# Patient Record
Sex: Female | Born: 1985 | Race: Black or African American | Hispanic: No | Marital: Single | Smoking: Never smoker
Health system: Southern US, Community
[De-identification: ages and names within clinical notes are randomized; demographics above are authoritative.]

## PROBLEM LIST (undated history)

## (undated) DIAGNOSIS — L309 Dermatitis, unspecified: Secondary | ICD-10-CM

## (undated) DIAGNOSIS — D589 Hereditary hemolytic anemia, unspecified: Secondary | ICD-10-CM

## (undated) DIAGNOSIS — A749 Chlamydial infection, unspecified: Secondary | ICD-10-CM

## (undated) HISTORY — DX: Dermatitis, unspecified: L30.9

## (undated) HISTORY — DX: Chlamydial infection, unspecified: A74.9

---
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
## 2006-05-01 ENCOUNTER — Emergency Department (HOSPITAL_COMMUNITY): Admission: EM | Admit: 2006-05-01 | Discharge: 2006-05-02 | Payer: Self-pay | Admitting: Emergency Medicine

## 2007-01-11 ENCOUNTER — Emergency Department (HOSPITAL_COMMUNITY): Admission: EM | Admit: 2007-01-11 | Discharge: 2007-01-12 | Payer: Self-pay | Admitting: Emergency Medicine

## 2007-09-01 ENCOUNTER — Other Ambulatory Visit: Admission: RE | Admit: 2007-09-01 | Discharge: 2007-09-01 | Payer: Self-pay | Admitting: Family Medicine

## 2009-08-03 ENCOUNTER — Inpatient Hospital Stay (HOSPITAL_COMMUNITY)
Admission: EM | Admit: 2009-08-03 | Discharge: 2009-08-08 | Payer: Self-pay | Source: Home / Self Care | Admitting: Emergency Medicine

## 2009-08-04 ENCOUNTER — Ambulatory Visit: Payer: Self-pay | Admitting: Oncology

## 2009-08-04 ENCOUNTER — Encounter (INDEPENDENT_AMBULATORY_CARE_PROVIDER_SITE_OTHER): Payer: Self-pay | Admitting: Internal Medicine

## 2009-08-07 ENCOUNTER — Ambulatory Visit: Payer: Self-pay | Admitting: Hematology & Oncology

## 2009-08-09 ENCOUNTER — Ambulatory Visit: Payer: Self-pay | Admitting: Oncology

## 2009-08-10 LAB — CBC WITH DIFFERENTIAL (CANCER CENTER ONLY)
HCT: 25 % — ABNORMAL LOW (ref 34.8–46.6)
HGB: 8.7 g/dL — ABNORMAL LOW (ref 11.6–15.9)
MCH: 38.5 pg — ABNORMAL HIGH (ref 26.0–34.0)
MCHC: 34.7 g/dL (ref 32.0–36.0)
MCV: 111 fL — ABNORMAL HIGH (ref 81–101)
RDW: 14.6 % (ref 10.5–14.6)
WBC: 13.2 10*3/uL — ABNORMAL HIGH (ref 3.9–10.0)

## 2009-08-10 LAB — CMP (CANCER CENTER ONLY)
ALT(SGPT): 18 U/L (ref 10–47)
Albumin: 4.4 g/dL (ref 3.3–5.5)
Alkaline Phosphatase: 53 U/L (ref 26–84)
BUN, Bld: 13 mg/dL (ref 7–22)
CO2: 26 mEq/L (ref 18–33)
Creat: 0.7 mg/dl (ref 0.6–1.2)
Glucose, Bld: 89 mg/dL (ref 73–118)
Potassium: 3.7 mEq/L (ref 3.3–4.7)
Total Bilirubin: 1.7 mg/dl — ABNORMAL HIGH (ref 0.20–1.60)

## 2009-08-10 LAB — MANUAL DIFFERENTIAL (CHCC SATELLITE)
ALC: 3.8 10*3/uL — ABNORMAL HIGH (ref 0.6–2.2)
LYMPH: 29 % (ref 14–48)
MONO: 9 % (ref 0–13)
PLT EST ~~LOC~~: INCREASED
SEG: 61 % (ref 40–75)

## 2009-08-11 LAB — IRON AND TIBC
%SAT: 36 % (ref 20–55)
Iron: 108 ug/dL (ref 42–145)

## 2009-08-11 LAB — HAPTOGLOBIN: Haptoglobin: <6 mg/dL — ABNORMAL LOW (ref 16–200)

## 2009-08-12 ENCOUNTER — Ambulatory Visit: Payer: Self-pay | Admitting: Oncology

## 2009-08-12 LAB — CBC WITH DIFFERENTIAL/PLATELET
BASO%: 1 % (ref 0.0–2.0)
HCT: 27.1 % — ABNORMAL LOW (ref 34.8–46.6)
MCHC: 33.5 g/dL (ref 31.5–36.0)
MONO#: 1.2 10*3/uL — ABNORMAL HIGH (ref 0.1–0.9)
NEUT%: 48 % (ref 38.4–76.8)
RBC: 2.43 10*6/uL — ABNORMAL LOW (ref 3.70–5.45)
RDW: 20.5 % — ABNORMAL HIGH (ref 11.2–14.5)
WBC: 16.9 10*3/uL — ABNORMAL HIGH (ref 3.9–10.3)
lymph#: 7.3 10*3/uL — ABNORMAL HIGH (ref 0.9–3.3)

## 2009-08-17 LAB — CBC WITH DIFFERENTIAL (CANCER CENTER ONLY)
HCT: 25.6 % — ABNORMAL LOW (ref 34.8–46.6)
HGB: 8.8 g/dL — ABNORMAL LOW (ref 11.6–15.9)
RBC: 2.34 10*6/uL — ABNORMAL LOW (ref 3.70–5.32)
WBC: 23 10*3/uL — ABNORMAL HIGH (ref 3.9–10.0)

## 2009-08-17 LAB — MANUAL DIFFERENTIAL (CHCC SATELLITE)
ALC: 4.4 10*3/uL — ABNORMAL HIGH (ref 0.6–2.2)
MONO: 6 % (ref 0–13)
SEG: 75 % (ref 40–75)

## 2009-08-18 LAB — HAPTOGLOBIN: Haptoglobin: <6 mg/dL — ABNORMAL LOW (ref 16–200)

## 2009-08-18 LAB — ANTI-NUCLEAR AB-TITER (ANA TITER): ANA Titer 1: 1:320 {titer} — ABNORMAL HIGH

## 2009-08-18 LAB — ANA: Anti Nuclear Antibody(ANA): POSITIVE — AB

## 2009-08-18 LAB — RETICULOCYTES (CHCC): Retic Ct Pct: 11.1 % — ABNORMAL HIGH (ref 0.4–3.1)

## 2009-08-19 LAB — CBC WITH DIFFERENTIAL/PLATELET
BASO%: 0.1 % (ref 0.0–2.0)
Eosinophils Absolute: 0 10*3/uL (ref 0.0–0.5)
MCHC: 31.8 g/dL (ref 31.5–36.0)
MONO#: 1 10*3/uL — ABNORMAL HIGH (ref 0.1–0.9)
NEUT#: 17.3 10*3/uL — ABNORMAL HIGH (ref 1.5–6.5)
Platelets: 346 10*3/uL (ref 145–400)
RBC: 2.52 10*6/uL — ABNORMAL LOW (ref 3.70–5.45)
RDW: 19.8 % — ABNORMAL HIGH (ref 11.2–14.5)
WBC: 21.7 10*3/uL — ABNORMAL HIGH (ref 3.9–10.3)
lymph#: 3.4 10*3/uL — ABNORMAL HIGH (ref 0.9–3.3)
nRBC: 1 % — ABNORMAL HIGH (ref 0–0)

## 2009-08-24 LAB — CBC WITH DIFFERENTIAL (CANCER CENTER ONLY)
BASO#: 0.1 10*3/uL (ref 0.0–0.2)
BASO%: 0.4 % (ref 0.0–2.0)
EOS%: 0.9 % (ref 0.0–7.0)
HCT: 26.7 % — ABNORMAL LOW (ref 34.8–46.6)
HGB: 9.3 g/dL — ABNORMAL LOW (ref 11.6–15.9)
LYMPH#: 1.9 10*3/uL (ref 0.9–3.3)
MCHC: 34.8 g/dL (ref 32.0–36.0)
MONO#: 0.5 10*3/uL (ref 0.1–0.9)
NEUT#: 11.3 10*3/uL — ABNORMAL HIGH (ref 1.5–6.5)
NEUT%: 81.8 % — ABNORMAL HIGH (ref 39.6–80.0)
WBC: 13.8 10*3/uL — ABNORMAL HIGH (ref 3.9–10.0)

## 2009-08-24 LAB — CMP (CANCER CENTER ONLY)
ALT(SGPT): 17 U/L (ref 10–47)
AST: 18 U/L (ref 11–38)
Albumin: 4.2 g/dL (ref 3.3–5.5)
BUN, Bld: 14 mg/dL (ref 7–22)
CO2: 25 mEq/L (ref 18–33)
Calcium: 9.4 mg/dL (ref 8.0–10.3)
Chloride: 101 mEq/L (ref 98–108)
Creat: 0.8 mg/dl (ref 0.6–1.2)
Potassium: 4.5 mEq/L (ref 3.3–4.7)

## 2009-08-24 LAB — LACTATE DEHYDROGENASE: LDH: 365 U/L — ABNORMAL HIGH (ref 94–250)

## 2009-08-31 LAB — CBC WITH DIFFERENTIAL (CANCER CENTER ONLY)
BASO%: 0.5 % (ref 0.0–2.0)
EOS%: 0.8 % (ref 0.0–7.0)
LYMPH%: 11.2 % — ABNORMAL LOW (ref 14.0–48.0)
MCH: 35.1 pg — ABNORMAL HIGH (ref 26.0–34.0)
MCV: 100 fL (ref 81–101)
MONO%: 5.6 % (ref 0.0–13.0)
NEUT#: 12 10*3/uL — ABNORMAL HIGH (ref 1.5–6.5)
Platelets: 438 10*3/uL — ABNORMAL HIGH (ref 145–400)
RDW: 14.4 % (ref 10.5–14.6)

## 2009-08-31 LAB — BASIC METABOLIC PANEL - CANCER CENTER ONLY
CO2: 26 mEq/L (ref 18–33)
Calcium: 9.7 mg/dL (ref 8.0–10.3)
Chloride: 100 mEq/L (ref 98–108)
Glucose, Bld: 98 mg/dL (ref 73–118)
Sodium: 131 mEq/L (ref 128–145)

## 2009-09-07 ENCOUNTER — Ambulatory Visit: Payer: Self-pay | Admitting: Oncology

## 2009-09-13 LAB — BASIC METABOLIC PANEL - CANCER CENTER ONLY
BUN, Bld: 15 mg/dL (ref 7–22)
Chloride: 103 mEq/L (ref 98–108)
Glucose, Bld: 92 mg/dL (ref 73–118)
Potassium: 3.9 mEq/L (ref 3.3–4.7)
Sodium: 139 mEq/L (ref 128–145)

## 2009-09-13 LAB — CBC WITH DIFFERENTIAL (CANCER CENTER ONLY)
BASO#: 0 10*3/uL (ref 0.0–0.2)
EOS%: 1 % (ref 0.0–7.0)
Eosinophils Absolute: 0.1 10*3/uL (ref 0.0–0.5)
HGB: 12 g/dL (ref 11.6–15.9)
LYMPH#: 1.9 10*3/uL (ref 0.9–3.3)
MONO%: 8.8 % (ref 0.0–13.0)
NEUT#: 4.3 10*3/uL (ref 1.5–6.5)
Platelets: 315 10*3/uL (ref 145–400)
RBC: 3.74 10*6/uL (ref 3.70–5.32)
WBC: 6.9 10*3/uL (ref 3.9–10.0)

## 2009-09-14 LAB — LACTATE DEHYDROGENASE: LDH: 195 U/L (ref 94–250)

## 2009-09-14 LAB — HAPTOGLOBIN: Haptoglobin: 23 mg/dL (ref 16–200)

## 2009-09-21 LAB — MANUAL DIFFERENTIAL (CHCC SATELLITE)
ALC: 2.3 10*3/uL — ABNORMAL HIGH (ref 0.6–2.2)
ANC (CHCC HP manual diff): 4 10*3/uL (ref 1.5–6.7)
LYMPH: 33 % (ref 14–48)
MONO: 10 % (ref 0–13)
RBC Comments: NORMAL

## 2009-09-21 LAB — CBC WITH DIFFERENTIAL (CANCER CENTER ONLY)
HCT: 37.9 % (ref 34.8–46.6)
HGB: 12.7 g/dL (ref 11.6–15.9)
MCHC: 33.6 g/dL (ref 32.0–36.0)
RBC: 4.11 10*6/uL (ref 3.70–5.32)

## 2009-09-27 LAB — RETICULOCYTES (CHCC)
RBC.: 4.03 MIL/uL (ref 3.87–5.11)
Retic Ct Pct: 0.8 % (ref 0.4–3.1)

## 2009-09-27 LAB — CBC WITH DIFFERENTIAL (CANCER CENTER ONLY)
BASO%: 0.5 % (ref 0.0–2.0)
EOS%: 1.5 % (ref 0.0–7.0)
HCT: 34.2 % — ABNORMAL LOW (ref 34.8–46.6)
LYMPH%: 33.8 % (ref 14.0–48.0)
MCH: 31 pg (ref 26.0–34.0)
MCHC: 34.6 g/dL (ref 32.0–36.0)
MCV: 90 fL (ref 81–101)
MONO%: 11.2 % (ref 0.0–13.0)
NEUT%: 53 % (ref 39.6–80.0)
Platelets: 275 10*3/uL (ref 145–400)
RDW: 14.5 % (ref 10.5–14.6)
WBC: 4.3 10*3/uL (ref 3.9–10.0)

## 2009-09-27 LAB — BASIC METABOLIC PANEL - CANCER CENTER ONLY
CO2: 25 mEq/L (ref 18–33)
Calcium: 9.1 mg/dL (ref 8.0–10.3)
Creat: 0.5 mg/dl — ABNORMAL LOW (ref 0.6–1.2)
Sodium: 138 mEq/L (ref 128–145)

## 2009-09-27 LAB — HEPATIC FUNCTION PANEL
ALT: 12 U/L (ref 0–35)
Bilirubin, Direct: 0.1 mg/dL (ref 0.0–0.3)
Indirect Bilirubin: 0.2 mg/dL (ref 0.0–0.9)
Total Bilirubin: 0.3 mg/dL (ref 0.3–1.2)

## 2009-09-27 LAB — LACTATE DEHYDROGENASE: LDH: 182 U/L (ref 94–250)

## 2009-09-27 LAB — HAPTOGLOBIN: Haptoglobin: 55 mg/dL (ref 16–200)

## 2009-10-04 ENCOUNTER — Ambulatory Visit: Payer: Self-pay | Admitting: Oncology

## 2009-10-04 LAB — CBC WITH DIFFERENTIAL (CANCER CENTER ONLY)
BASO%: 0.6 % (ref 0.0–2.0)
LYMPH%: 34.9 % (ref 14.0–48.0)
MCV: 89 fL (ref 81–101)
MONO#: 0.6 10*3/uL (ref 0.1–0.9)
MONO%: 9.2 % (ref 0.0–13.0)
NEUT#: 3.4 10*3/uL (ref 1.5–6.5)
Platelets: 291 10*3/uL (ref 145–400)
RDW: 14 % (ref 10.5–14.6)
WBC: 6 10*3/uL (ref 3.9–10.0)

## 2009-10-04 LAB — CMP (CANCER CENTER ONLY)
ALT(SGPT): 15 U/L (ref 10–47)
Alkaline Phosphatase: 49 U/L (ref 26–84)
Potassium: 3.4 mEq/L (ref 3.3–4.7)
Sodium: 132 mEq/L (ref 128–145)
Total Bilirubin: 0.5 mg/dl (ref 0.20–1.60)
Total Protein: 7.3 g/dL (ref 6.4–8.1)

## 2009-10-11 LAB — CBC WITH DIFFERENTIAL/PLATELET
BASO%: 0.4 % (ref 0.0–2.0)
LYMPH%: 24.3 % (ref 14.0–49.7)
MCHC: 34 g/dL (ref 31.5–36.0)
MONO#: 0.9 10*3/uL (ref 0.1–0.9)
RBC: 4.18 10*6/uL (ref 3.70–5.45)
WBC: 7.4 10*3/uL (ref 3.9–10.3)
lymph#: 1.8 10*3/uL (ref 0.9–3.3)

## 2009-10-18 LAB — CBC WITH DIFFERENTIAL/PLATELET
BASO%: 0.5 % (ref 0.0–2.0)
HCT: 37.7 % (ref 34.8–46.6)
HGB: 13 g/dL (ref 11.6–15.9)
MCHC: 34.5 g/dL (ref 31.5–36.0)
MONO#: 0.5 10*3/uL (ref 0.1–0.9)
NEUT%: 57.8 % (ref 38.4–76.8)
RDW: 15.9 % — ABNORMAL HIGH (ref 11.2–14.5)
WBC: 6.4 10*3/uL (ref 3.9–10.3)
lymph#: 2.2 10*3/uL (ref 0.9–3.3)

## 2009-10-19 ENCOUNTER — Ambulatory Visit: Payer: Self-pay | Admitting: Oncology

## 2009-10-25 LAB — CMP (CANCER CENTER ONLY)
Albumin: 3.7 g/dL (ref 3.3–5.5)
BUN, Bld: 10 mg/dL (ref 7–22)
CO2: 25 mEq/L (ref 18–33)
Calcium: 8.8 mg/dL (ref 8.0–10.3)
Chloride: 99 mEq/L (ref 98–108)
Creat: 0.6 mg/dl (ref 0.6–1.2)
Glucose, Bld: 96 mg/dL (ref 73–118)
Potassium: 3.8 mEq/L (ref 3.3–4.7)

## 2009-10-25 LAB — CBC WITH DIFFERENTIAL (CANCER CENTER ONLY)
BASO#: 0 10*3/uL (ref 0.0–0.2)
EOS%: 1.7 % (ref 0.0–7.0)
Eosinophils Absolute: 0.1 10*3/uL (ref 0.0–0.5)
HCT: 35.9 % (ref 34.8–46.6)
HGB: 12.4 g/dL (ref 11.6–15.9)
LYMPH#: 1.4 10*3/uL (ref 0.9–3.3)
MCHC: 34.7 g/dL (ref 32.0–36.0)
MONO#: 0.6 10*3/uL (ref 0.1–0.9)
NEUT%: 60.6 % (ref 39.6–80.0)
RBC: 4.14 10*6/uL (ref 3.70–5.32)

## 2009-10-25 LAB — LACTATE DEHYDROGENASE: LDH: 155 U/L (ref 94–250)

## 2009-11-04 ENCOUNTER — Ambulatory Visit: Payer: Self-pay | Admitting: Oncology

## 2009-11-08 LAB — CBC WITH DIFFERENTIAL/PLATELET
BASO%: 0.3 % (ref 0.0–2.0)
EOS%: 2.1 % (ref 0.0–7.0)
LYMPH%: 42.9 % (ref 14.0–49.7)
MCHC: 34.2 g/dL (ref 31.5–36.0)
MCV: 86.4 fL (ref 79.5–101.0)
MONO%: 11.9 % (ref 0.0–14.0)
Platelets: 266 10*3/uL (ref 145–400)
RBC: 4.37 10*6/uL (ref 3.70–5.45)
WBC: 4.2 10*3/uL (ref 3.9–10.3)

## 2009-11-16 ENCOUNTER — Ambulatory Visit: Payer: Self-pay | Admitting: Oncology

## 2009-12-13 ENCOUNTER — Ambulatory Visit: Payer: Self-pay | Admitting: Oncology

## 2009-12-13 ENCOUNTER — Inpatient Hospital Stay (HOSPITAL_COMMUNITY): Admission: EM | Admit: 2009-12-13 | Discharge: 2009-12-16 | Payer: Self-pay | Admitting: Oncology

## 2009-12-13 LAB — CBC WITH DIFFERENTIAL (CANCER CENTER ONLY)
HCT: 21.9 % — ABNORMAL LOW (ref 34.8–46.6)
MCHC: 0 g/dL — ABNORMAL LOW (ref 32.0–36.0)
Platelets: 315 10*3/uL (ref 145–400)
RDW: 18 % — ABNORMAL HIGH (ref 10.5–14.6)
WBC: 10.9 10*3/uL — ABNORMAL HIGH (ref 3.9–10.0)

## 2009-12-13 LAB — MANUAL DIFFERENTIAL (CHCC SATELLITE)
Eos: 2 % (ref 0–7)
PLT EST ~~LOC~~: ADEQUATE
nRBC: 14 % — ABNORMAL HIGH (ref 0–0)

## 2009-12-14 LAB — RETICULOCYTES (CHCC)
ABS Retic: 294 10*3/uL — ABNORMAL HIGH (ref 19.0–186.0)
RBC.: 2.43 MIL/uL — ABNORMAL LOW (ref 3.87–5.11)
Retic Ct Pct: 12.1 % — ABNORMAL HIGH (ref 0.4–3.1)

## 2009-12-14 LAB — LACTATE DEHYDROGENASE: LDH: 437 U/L — ABNORMAL HIGH (ref 94–250)

## 2009-12-14 LAB — COMPREHENSIVE METABOLIC PANEL
AST: 27 U/L (ref 0–37)
Albumin: 4.3 g/dL (ref 3.5–5.2)
BUN: 4 mg/dL — ABNORMAL LOW (ref 6–23)
CO2: 25 mEq/L (ref 19–32)
Calcium: 9.8 mg/dL (ref 8.4–10.5)
Chloride: 103 mEq/L (ref 96–112)
Creatinine, Ser: 0.57 mg/dL (ref 0.40–1.20)
Glucose, Bld: 87 mg/dL (ref 70–99)
Potassium: 4 mEq/L (ref 3.5–5.3)

## 2009-12-14 LAB — VITAMIN B12: Vitamin B-12: 410 pg/mL (ref 211–911)

## 2009-12-14 LAB — HAPTOGLOBIN: Haptoglobin: <6 mg/dL — ABNORMAL LOW (ref 16–200)

## 2009-12-14 LAB — FOLATE: Folate: 15.5 ng/mL

## 2009-12-14 LAB — DIRECT ANTIGLOBULIN TEST (NOT AT ARMC): DAT IgG: POSITIVE — AB

## 2009-12-14 LAB — IRON AND TIBC: TIBC: 332 ug/dL (ref 250–470)

## 2009-12-16 ENCOUNTER — Ambulatory Visit: Payer: Self-pay | Admitting: Oncology

## 2009-12-20 LAB — CBC WITH DIFFERENTIAL (CANCER CENTER ONLY)
HGB: 7.4 g/dL — ABNORMAL LOW (ref 11.6–15.9)
MCH: 40.9 pg — ABNORMAL HIGH (ref 26.0–34.0)
MCHC: 34.6 g/dL (ref 32.0–36.0)
MCV: 118 fL — ABNORMAL HIGH (ref 81–101)
Platelets: 320 10*3/uL (ref 145–400)
RBC: 1.82 10*6/uL — ABNORMAL LOW (ref 3.70–5.32)

## 2009-12-20 LAB — MANUAL DIFFERENTIAL (CHCC SATELLITE)
ANC (CHCC HP manual diff): 13.7 10*3/uL — ABNORMAL HIGH (ref 1.5–6.7)
Band Neutrophils: 3 % (ref 0–10)
LYMPH: 13 % — ABNORMAL LOW (ref 14–48)
Metamyelocytes: 3 % — ABNORMAL HIGH (ref 0–0)
Myelocytes: 1 % — ABNORMAL HIGH (ref 0–0)
PLT EST ~~LOC~~: ADEQUATE
nRBC: 51 % — ABNORMAL HIGH (ref 0–0)

## 2009-12-22 ENCOUNTER — Ambulatory Visit: Payer: Self-pay | Admitting: Oncology

## 2009-12-26 ENCOUNTER — Inpatient Hospital Stay (HOSPITAL_COMMUNITY)
Admission: AD | Admit: 2009-12-26 | Discharge: 2010-01-09 | Payer: Self-pay | Source: Home / Self Care | Admitting: Oncology

## 2009-12-26 LAB — CBC & DIFF AND RETIC
Basophils Absolute: 0.3 10*3/uL — ABNORMAL HIGH (ref 0.0–0.1)
EOS%: 0.1 % (ref 0.0–7.0)
Eosinophils Absolute: 0 10*3/uL (ref 0.0–0.5)
HGB: 5.4 g/dL — CL (ref 11.6–15.9)
LYMPH%: 10.7 % — ABNORMAL LOW (ref 14.0–49.7)
MCH: 43.2 pg — ABNORMAL HIGH (ref 25.1–34.0)
MCV: 140 fL — ABNORMAL HIGH (ref 79.5–101.0)
MONO%: 11.1 % (ref 0.0–14.0)
NEUT#: 22.6 10*3/uL — ABNORMAL HIGH (ref 1.5–6.5)
NEUT%: 77.2 % — ABNORMAL HIGH (ref 38.4–76.8)
Platelets: 233 10*3/uL (ref 145–400)

## 2009-12-26 LAB — COMPREHENSIVE METABOLIC PANEL
Albumin: 3.7 g/dL (ref 3.5–5.2)
Alkaline Phosphatase: 33 U/L — ABNORMAL LOW (ref 39–117)
BUN: 11 mg/dL (ref 6–23)
CO2: 29 mEq/L (ref 19–32)
Calcium: 9.8 mg/dL (ref 8.4–10.5)
Chloride: 101 mEq/L (ref 96–112)
Glucose, Bld: 78 mg/dL (ref 70–99)
Potassium: 3.7 mEq/L (ref 3.5–5.3)
Sodium: 138 mEq/L (ref 135–145)
Total Protein: 6.8 g/dL (ref 6.0–8.3)

## 2009-12-26 LAB — RETICULOCYTES (CHCC)

## 2009-12-27 LAB — DIRECT ANTIGLOBULIN TEST (NOT AT ARMC): DAT IgG: POSITIVE — AB

## 2010-01-05 ENCOUNTER — Encounter: Payer: Self-pay | Admitting: Oncology

## 2010-01-13 LAB — CBC WITH DIFFERENTIAL/PLATELET
Basophils Absolute: 0 10*3/uL (ref 0.0–0.1)
Eosinophils Absolute: 0 10*3/uL (ref 0.0–0.5)
HCT: 31.8 % — ABNORMAL LOW (ref 34.8–46.6)
HGB: 9.7 g/dL — ABNORMAL LOW (ref 11.6–15.9)
LYMPH%: 4.6 % — ABNORMAL LOW (ref 14.0–49.7)
MCV: 108.2 fL — ABNORMAL HIGH (ref 79.5–101.0)
MONO#: 0.3 10*3/uL (ref 0.1–0.9)
MONO%: 1.9 % (ref 0.0–14.0)
NEUT#: 14.6 10*3/uL — ABNORMAL HIGH (ref 1.5–6.5)
NEUT%: 93.4 % — ABNORMAL HIGH (ref 38.4–76.8)
Platelets: 513 10*3/uL — ABNORMAL HIGH (ref 145–400)
RBC: 2.94 10*6/uL — ABNORMAL LOW (ref 3.70–5.45)

## 2010-01-14 LAB — DIRECT ANTIGLOBULIN TEST (NOT AT ARMC)
DAT (Complement): NEGATIVE
DAT IgG: POSITIVE — AB

## 2010-01-17 ENCOUNTER — Ambulatory Visit: Payer: Self-pay | Admitting: Oncology

## 2010-01-19 LAB — CBC & DIFF AND RETIC
Basophils Absolute: 0 10*3/uL (ref 0.0–0.1)
EOS%: 0.2 % (ref 0.0–7.0)
Eosinophils Absolute: 0 10*3/uL (ref 0.0–0.5)
HCT: 33.8 % — ABNORMAL LOW (ref 34.8–46.6)
HGB: 10.3 g/dL — ABNORMAL LOW (ref 11.6–15.9)
Immature Retic Fract: 17.3 % — ABNORMAL HIGH (ref 0.00–10.70)
MCH: 32 pg (ref 25.1–34.0)
MCV: 105 fL — ABNORMAL HIGH (ref 79.5–101.0)
NEUT#: 10.4 10*3/uL — ABNORMAL HIGH (ref 1.5–6.5)
NEUT%: 70.2 % (ref 38.4–76.8)
RDW: 17.1 % — ABNORMAL HIGH (ref 11.2–14.5)
Retic Ct Abs: 152.31 10*3/uL — ABNORMAL HIGH (ref 18.30–72.70)
lymph#: 2.6 10*3/uL (ref 0.9–3.3)

## 2010-01-19 LAB — LACTATE DEHYDROGENASE: LDH: 334 U/L — ABNORMAL HIGH (ref 94–250)

## 2010-02-02 LAB — CBC WITH DIFFERENTIAL/PLATELET
BASO%: 0.3 % (ref 0.0–2.0)
Basophils Absolute: 0 10*3/uL (ref 0.0–0.1)
EOS%: 0.1 % (ref 0.0–7.0)
Eosinophils Absolute: 0 10*3/uL (ref 0.0–0.5)
HCT: 37.2 % (ref 34.8–46.6)
HGB: 12.4 g/dL (ref 11.6–15.9)
LYMPH%: 18.7 % (ref 14.0–49.7)
MCH: 32 pg (ref 25.1–34.0)
MCHC: 33.3 g/dL (ref 31.5–36.0)
MCV: 96.2 fL (ref 79.5–101.0)
MONO#: 1 10*3/uL — ABNORMAL HIGH (ref 0.1–0.9)
MONO%: 7.1 % (ref 0.0–14.0)
NEUT#: 10.8 10*3/uL — ABNORMAL HIGH (ref 1.5–6.5)
NEUT%: 73.8 % (ref 38.4–76.8)
Platelets: 405 10*3/uL — ABNORMAL HIGH (ref 145–400)
RBC: 3.87 10*6/uL (ref 3.70–5.45)
RDW: 18.3 % — ABNORMAL HIGH (ref 11.2–14.5)
WBC: 14.6 10*3/uL — ABNORMAL HIGH (ref 3.9–10.3)
lymph#: 2.7 10*3/uL (ref 0.9–3.3)

## 2010-02-05 DIAGNOSIS — D589 Hereditary hemolytic anemia, unspecified: Secondary | ICD-10-CM

## 2010-02-05 HISTORY — PX: SPLENECTOMY: SUR1306

## 2010-02-05 HISTORY — DX: Hereditary hemolytic anemia, unspecified: D58.9

## 2010-02-09 LAB — CBC WITH DIFFERENTIAL/PLATELET
BASO%: 0.3 % (ref 0.0–2.0)
Basophils Absolute: 0 10*3/uL (ref 0.0–0.1)
EOS%: 0.2 % (ref 0.0–7.0)
Eosinophils Absolute: 0 10*3/uL (ref 0.0–0.5)
HCT: 41 % (ref 34.8–46.6)
HGB: 13.2 g/dL (ref 11.6–15.9)
LYMPH%: 23.5 % (ref 14.0–49.7)
MCH: 30.5 pg (ref 25.1–34.0)
MCHC: 32.2 g/dL (ref 31.5–36.0)
MCV: 94.7 fL (ref 79.5–101.0)
MONO#: 1.5 10*3/uL — ABNORMAL HIGH (ref 0.1–0.9)
MONO%: 13.5 % (ref 0.0–14.0)
NEUT#: 6.8 10*3/uL — ABNORMAL HIGH (ref 1.5–6.5)
NEUT%: 62.5 % (ref 38.4–76.8)
Platelets: 379 10*3/uL (ref 145–400)
RBC: 4.33 10*6/uL (ref 3.70–5.45)
RDW: 15.7 % — ABNORMAL HIGH (ref 11.2–14.5)
WBC: 10.9 10*3/uL — ABNORMAL HIGH (ref 3.9–10.3)
lymph#: 2.6 10*3/uL (ref 0.9–3.3)
nRBC: 0 % (ref 0–0)

## 2010-02-16 ENCOUNTER — Ambulatory Visit: Payer: Self-pay | Admitting: Oncology

## 2010-02-16 LAB — CBC WITH DIFFERENTIAL/PLATELET
BASO%: 1.1 % (ref 0.0–2.0)
Basophils Absolute: 0.1 10*3/uL (ref 0.0–0.1)
EOS%: 1 % (ref 0.0–7.0)
Eosinophils Absolute: 0.1 10*3/uL (ref 0.0–0.5)
HCT: 40.3 % (ref 34.8–46.6)
HGB: 13.1 g/dL (ref 11.6–15.9)
LYMPH%: 28.2 % (ref 14.0–49.7)
MCH: 30.3 pg (ref 25.1–34.0)
MCHC: 32.5 g/dL (ref 31.5–36.0)
MCV: 93.3 fL (ref 79.5–101.0)
MONO#: 1.1 10*3/uL — ABNORMAL HIGH (ref 0.1–0.9)
MONO%: 15.1 % — ABNORMAL HIGH (ref 0.0–14.0)
NEUT#: 3.8 10*3/uL (ref 1.5–6.5)
NEUT%: 54.6 % (ref 38.4–76.8)
Platelets: 323 10*3/uL (ref 145–400)
RBC: 4.32 10*6/uL (ref 3.70–5.45)
RDW: 15.2 % — ABNORMAL HIGH (ref 11.2–14.5)
WBC: 7 10*3/uL (ref 3.9–10.3)
lymph#: 2 10*3/uL (ref 0.9–3.3)
nRBC: 0 % (ref 0–0)

## 2010-02-20 LAB — CBC & DIFF AND RETIC
BASO%: 0.6 % (ref 0.0–2.0)
Basophils Absolute: 0 10*3/uL (ref 0.0–0.1)
EOS%: 1 % (ref 0.0–7.0)
Eosinophils Absolute: 0.1 10*3/uL (ref 0.0–0.5)
HCT: 40.6 % (ref 34.8–46.6)
HGB: 13.3 g/dL (ref 11.6–15.9)
Immature Retic Fract: 4.1 % (ref 0.00–10.70)
LYMPH%: 23.8 % (ref 14.0–49.7)
MCH: 30.1 pg (ref 25.1–34.0)
MCHC: 32.8 g/dL (ref 31.5–36.0)
MCV: 91.9 fL (ref 79.5–101.0)
MONO#: 0.7 10*3/uL (ref 0.1–0.9)
MONO%: 12.4 % (ref 0.0–14.0)
NEUT#: 3.3 10*3/uL (ref 1.5–6.5)
NEUT%: 62.2 % (ref 38.4–76.8)
Platelets: ADEQUATE 10*3/uL (ref 145–400)
RBC: 4.42 10*6/uL (ref 3.70–5.45)
RDW: 15 % — ABNORMAL HIGH (ref 11.2–14.5)
Retic %: 0.68 % (ref 0.50–1.50)
Retic Ct Abs: 30.06 10*3/uL (ref 18.30–72.70)
WBC: 5.3 10*3/uL (ref 3.9–10.3)
lymph#: 1.3 10*3/uL (ref 0.9–3.3)

## 2010-02-21 LAB — DIRECT ANTIGLOBULIN TEST (NOT AT ARMC)
DAT (Complement): NEGATIVE
DAT IgG: POSITIVE — AB

## 2010-02-21 LAB — COMPREHENSIVE METABOLIC PANEL
ALT: 25 U/L (ref 0–35)
AST: 22 U/L (ref 0–37)
Albumin: 4.4 g/dL (ref 3.5–5.2)
Alkaline Phosphatase: 50 U/L (ref 39–117)
BUN: 9 mg/dL (ref 6–23)
CO2: 23 mEq/L (ref 19–32)
Calcium: 9.7 mg/dL (ref 8.4–10.5)
Chloride: 106 mEq/L (ref 96–112)
Creatinine, Ser: 0.63 mg/dL (ref 0.40–1.20)
Glucose, Bld: 98 mg/dL (ref 70–99)
Potassium: 4.1 mEq/L (ref 3.5–5.3)
Sodium: 141 mEq/L (ref 135–145)
Total Bilirubin: 0.2 mg/dL — ABNORMAL LOW (ref 0.3–1.2)
Total Protein: 7.1 g/dL (ref 6.0–8.3)

## 2010-02-21 LAB — HAPTOGLOBIN: Haptoglobin: 50 mg/dL (ref 16–200)

## 2010-02-21 LAB — LACTATE DEHYDROGENASE: LDH: 204 U/L (ref 94–250)

## 2010-04-06 ENCOUNTER — Encounter (HOSPITAL_BASED_OUTPATIENT_CLINIC_OR_DEPARTMENT_OTHER): Payer: BC Managed Care – PPO | Admitting: Oncology

## 2010-04-06 ENCOUNTER — Other Ambulatory Visit: Payer: Self-pay | Admitting: Oncology

## 2010-04-06 LAB — CBC WITH DIFFERENTIAL/PLATELET
Basophils Absolute: 0 10*3/uL (ref 0.0–0.1)
EOS%: 1.3 % (ref 0.0–7.0)
HCT: 34.3 % — ABNORMAL LOW (ref 34.8–46.6)
HGB: 12.2 g/dL (ref 11.6–15.9)
LYMPH%: 21.6 % (ref 14.0–49.7)
MCH: 29.9 pg (ref 25.1–34.0)
MONO#: 0.8 10*3/uL (ref 0.1–0.9)
NEUT%: 67.6 % (ref 38.4–76.8)
Platelets: 353 10*3/uL (ref 145–400)
lymph#: 1.9 10*3/uL (ref 0.9–3.3)

## 2010-04-18 LAB — RETICULOCYTES
RBC.: 1.81 MIL/uL — ABNORMAL LOW (ref 3.87–5.11)
RBC.: 1.93 MIL/uL — ABNORMAL LOW (ref 3.87–5.11)
RBC.: 2.23 MIL/uL — ABNORMAL LOW (ref 3.87–5.11)
Retic Ct Pct: >18 % — ABNORMAL HIGH (ref 0.4–3.1)
Retic Ct Pct: >20 % — ABNORMAL HIGH (ref 0.4–3.1)
Retic Ct Pct: >20 % — ABNORMAL HIGH (ref 0.4–3.1)

## 2010-04-18 LAB — CBC
HCT: 19.6 % — ABNORMAL LOW (ref 36.0–46.0)
HCT: 20.3 % — ABNORMAL LOW (ref 36.0–46.0)
HCT: 21.3 % — ABNORMAL LOW (ref 36.0–46.0)
HCT: 22.1 % — ABNORMAL LOW (ref 36.0–46.0)
HCT: 28.8 % — ABNORMAL LOW (ref 36.0–46.0)
Hemoglobin: 6.5 g/dL — CL (ref 12.0–15.0)
Hemoglobin: 6.8 g/dL — CL (ref 12.0–15.0)
Hemoglobin: 6.8 g/dL — CL (ref 12.0–15.0)
Hemoglobin: 6.8 g/dL — CL (ref 12.0–15.0)
Hemoglobin: 7 g/dL — ABNORMAL LOW (ref 12.0–15.0)
Hemoglobin: 7.2 g/dL — ABNORMAL LOW (ref 12.0–15.0)
Hemoglobin: 7.8 g/dL — ABNORMAL LOW (ref 12.0–15.0)
Hemoglobin: 8.2 g/dL — ABNORMAL LOW (ref 12.0–15.0)
Hemoglobin: 8.8 g/dL — ABNORMAL LOW (ref 12.0–15.0)
MCH: 36 pg — ABNORMAL HIGH (ref 26.0–34.0)
MCH: 36.3 pg — ABNORMAL HIGH (ref 26.0–34.0)
MCH: 37.8 pg — ABNORMAL HIGH (ref 26.0–34.0)
MCH: 37.8 pg — ABNORMAL HIGH (ref 26.0–34.0)
MCH: 39.4 pg — ABNORMAL HIGH (ref 26.0–34.0)
MCH: 39.5 pg — ABNORMAL HIGH (ref 26.0–34.0)
MCH: 40.3 pg — ABNORMAL HIGH (ref 26.0–34.0)
MCH: 40.5 pg — ABNORMAL HIGH (ref 26.0–34.0)
MCH: 40.9 pg — ABNORMAL HIGH (ref 26.0–34.0)
MCHC: 30.6 g/dL (ref 30.0–36.0)
MCHC: 31.2 g/dL (ref 30.0–36.0)
MCHC: 33.6 g/dL (ref 30.0–36.0)
MCHC: 34.5 g/dL (ref 30.0–36.0)
MCHC: 36 g/dL (ref 30.0–36.0)
MCHC: 36 g/dL (ref 30.0–36.0)
MCV: 100.2 fL — ABNORMAL HIGH (ref 78.0–100.0)
MCV: 116.6 fL — ABNORMAL HIGH (ref 78.0–100.0)
MCV: 118.5 fL — ABNORMAL HIGH (ref 78.0–100.0)
MCV: 119.9 fL — ABNORMAL HIGH (ref 78.0–100.0)
MCV: 128.5 fL — ABNORMAL HIGH (ref 78.0–100.0)
Platelets: 212 10*3/uL (ref 150–400)
Platelets: 231 10*3/uL (ref 150–400)
Platelets: 237 10*3/uL (ref 150–400)
Platelets: 250 10*3/uL (ref 150–400)
Platelets: 267 10*3/uL (ref 150–400)
Platelets: 268 10*3/uL (ref 150–400)
Platelets: 285 10*3/uL (ref 150–400)
Platelets: 315 10*3/uL (ref 150–400)
Platelets: 329 10*3/uL (ref 150–400)
RBC: 1.65 MIL/uL — ABNORMAL LOW (ref 3.87–5.11)
RBC: 1.67 MIL/uL — ABNORMAL LOW (ref 3.87–5.11)
RBC: 1.71 MIL/uL — ABNORMAL LOW (ref 3.87–5.11)
RBC: 1.72 MIL/uL — ABNORMAL LOW (ref 3.87–5.11)
RBC: 1.74 MIL/uL — ABNORMAL LOW (ref 3.87–5.11)
RBC: 1.92 MIL/uL — ABNORMAL LOW (ref 3.87–5.11)
RBC: 2.26 MIL/uL — ABNORMAL LOW (ref 3.87–5.11)
RBC: 2.29 MIL/uL — ABNORMAL LOW (ref 3.87–5.11)
RBC: 2.38 MIL/uL — ABNORMAL LOW (ref 3.87–5.11)
RDW: 22.5 % — ABNORMAL HIGH (ref 11.5–15.5)
RDW: 23.3 % — ABNORMAL HIGH (ref 11.5–15.5)
RDW: 24.3 % — ABNORMAL HIGH (ref 11.5–15.5)
RDW: 25 % — ABNORMAL HIGH (ref 11.5–15.5)
RDW: 26.5 % — ABNORMAL HIGH (ref 11.5–15.5)
RDW: 33.6 % — ABNORMAL HIGH (ref 11.5–15.5)
WBC: 15.5 10*3/uL — ABNORMAL HIGH (ref 4.0–10.5)
WBC: 19 10*3/uL — ABNORMAL HIGH (ref 4.0–10.5)
WBC: 21 10*3/uL — ABNORMAL HIGH (ref 4.0–10.5)
WBC: 21.6 10*3/uL — ABNORMAL HIGH (ref 4.0–10.5)
WBC: 24 10*3/uL — ABNORMAL HIGH (ref 4.0–10.5)
WBC: 27.9 10*3/uL — ABNORMAL HIGH (ref 4.0–10.5)

## 2010-04-18 LAB — URINALYSIS, ROUTINE W REFLEX MICROSCOPIC
Bilirubin Urine: NEGATIVE
Hgb urine dipstick: NEGATIVE
Ketones, ur: NEGATIVE mg/dL
Nitrite: NEGATIVE
pH: 7.5 (ref 5.0–8.0)

## 2010-04-18 LAB — COMPREHENSIVE METABOLIC PANEL
ALT: 15 U/L (ref 0–35)
ALT: 15 U/L (ref 0–35)
ALT: 16 U/L (ref 0–35)
AST: 30 U/L (ref 0–37)
AST: 57 U/L — ABNORMAL HIGH (ref 0–37)
AST: 59 U/L — ABNORMAL HIGH (ref 0–37)
AST: 63 U/L — ABNORMAL HIGH (ref 0–37)
AST: 76 U/L — ABNORMAL HIGH (ref 0–37)
Albumin: 3 g/dL — ABNORMAL LOW (ref 3.5–5.2)
Albumin: 3.3 g/dL — ABNORMAL LOW (ref 3.5–5.2)
Albumin: 3.6 g/dL (ref 3.5–5.2)
Albumin: 3.8 g/dL (ref 3.5–5.2)
BUN: 10 mg/dL (ref 6–23)
BUN: 12 mg/dL (ref 6–23)
BUN: 17 mg/dL (ref 6–23)
CO2: 23 mEq/L (ref 19–32)
CO2: 27 mEq/L (ref 19–32)
CO2: 27 mEq/L (ref 19–32)
CO2: 27 mEq/L (ref 19–32)
Calcium: 9.6 mg/dL (ref 8.4–10.5)
Calcium: 9.6 mg/dL (ref 8.4–10.5)
Chloride: 102 mEq/L (ref 96–112)
Chloride: 102 mEq/L (ref 96–112)
Chloride: 103 mEq/L (ref 96–112)
Chloride: 105 mEq/L (ref 96–112)
Chloride: 107 mEq/L (ref 96–112)
Creatinine, Ser: 0.6 mg/dL (ref 0.4–1.2)
Creatinine, Ser: 0.69 mg/dL (ref 0.4–1.2)
Creatinine, Ser: 0.71 mg/dL (ref 0.4–1.2)
Creatinine, Ser: 0.77 mg/dL (ref 0.4–1.2)
Creatinine, Ser: 0.89 mg/dL (ref 0.4–1.2)
GFR calc Af Amer: >60 mL/min (ref >60)
GFR calc Af Amer: >60 mL/min (ref >60)
GFR calc Af Amer: >60 mL/min (ref >60)
GFR calc Af Amer: >60 mL/min (ref >60)
GFR calc Af Amer: >60 mL/min (ref >60)
GFR calc non Af Amer: >60 mL/min (ref >60)
GFR calc non Af Amer: >60 mL/min (ref >60)
GFR calc non Af Amer: >60 mL/min (ref >60)
Glucose, Bld: 87 mg/dL (ref 70–99)
Glucose, Bld: 91 mg/dL (ref 70–99)
Potassium: 4.4 mEq/L (ref 3.5–5.1)
Sodium: 136 mEq/L (ref 135–145)
Sodium: 136 mEq/L (ref 135–145)
Sodium: 136 mEq/L (ref 135–145)
Total Bilirubin: 1 mg/dL (ref 0.3–1.2)
Total Bilirubin: 1.2 mg/dL (ref 0.3–1.2)
Total Bilirubin: 1.5 mg/dL — ABNORMAL HIGH (ref 0.3–1.2)
Total Bilirubin: 1.7 mg/dL — ABNORMAL HIGH (ref 0.3–1.2)
Total Bilirubin: 1.9 mg/dL — ABNORMAL HIGH (ref 0.3–1.2)
Total Protein: 7.1 g/dL (ref 6.0–8.3)
Total Protein: 7.4 g/dL (ref 6.0–8.3)
Total Protein: 8.5 g/dL — ABNORMAL HIGH (ref 6.0–8.3)

## 2010-04-18 LAB — TYPE AND SCREEN
Antibody Screen: POSITIVE
DAT, IgG: POSITIVE
Unit division: 0
Unit division: 0
Unit division: 0

## 2010-04-18 LAB — DIFFERENTIAL
Basophils Absolute: 0 10*3/uL (ref 0.0–0.1)
Basophils Absolute: 0 10*3/uL (ref 0.0–0.1)
Basophils Relative: 0 % (ref 0–1)
Eosinophils Absolute: 0 10*3/uL (ref 0.0–0.7)
Eosinophils Absolute: 0 10*3/uL (ref 0.0–0.7)
Eosinophils Relative: 0 % (ref 0–5)
Lymphocytes Relative: 21 % (ref 12–46)
Lymphs Abs: 2.7 10*3/uL (ref 0.7–4.0)
Monocytes Absolute: 1.9 10*3/uL — ABNORMAL HIGH (ref 0.1–1.0)
Monocytes Relative: 7 % (ref 3–12)
Neutro Abs: 11.5 10*3/uL — ABNORMAL HIGH (ref 1.7–7.7)
Neutro Abs: 14.1 10*3/uL — ABNORMAL HIGH (ref 1.7–7.7)
Neutro Abs: 9.7 10*3/uL — ABNORMAL HIGH (ref 1.7–7.7)

## 2010-04-18 LAB — IRON AND TIBC
Iron: 117 ug/dL (ref 42–135)
UIBC: 140 ug/dL

## 2010-04-18 LAB — BASIC METABOLIC PANEL
BUN: 9 mg/dL (ref 6–23)
CO2: 27 mEq/L (ref 19–32)
Chloride: 104 mEq/L (ref 96–112)
Chloride: 99 mEq/L (ref 96–112)
GFR calc Af Amer: >60 mL/min (ref >60)
GFR calc non Af Amer: >60 mL/min (ref >60)
Glucose, Bld: 116 mg/dL — ABNORMAL HIGH (ref 70–99)
Potassium: 3.5 mEq/L (ref 3.5–5.1)
Potassium: 3.9 mEq/L (ref 3.5–5.1)
Sodium: 139 mEq/L (ref 135–145)

## 2010-04-18 LAB — HEMOGLOBIN AND HEMATOCRIT, BLOOD: Hemoglobin: 7.4 g/dL — ABNORMAL LOW (ref 12.0–15.0)

## 2010-04-18 LAB — CROSSMATCH
ABO/RH(D): AB POS
DAT, IgG: POSITIVE
Unit division: 0

## 2010-04-18 LAB — CULTURE, BLOOD (SINGLE): Culture: NO GROWTH

## 2010-04-18 LAB — URINE CULTURE
Culture  Setup Time: 201111090617
Culture: NO GROWTH

## 2010-04-18 LAB — HEMOGLOBIN: Hemoglobin: 7.6 g/dL — ABNORMAL LOW (ref 12.0–15.0)

## 2010-04-18 LAB — PROTIME-INR
INR: 0.93 (ref 0.00–1.49)
Prothrombin Time: 12.7 seconds (ref 11.6–15.2)

## 2010-04-18 LAB — MRSA PCR SCREENING
MRSA by PCR: NEGATIVE
MRSA by PCR: NEGATIVE

## 2010-04-23 LAB — CBC
HCT: 22 % — ABNORMAL LOW (ref 36.0–46.0)
HCT: 14.9 % — ABNORMAL LOW (ref 36.0–46.0)
HCT: 15.9 % — ABNORMAL LOW (ref 36.0–46.0)
Hemoglobin: 6.9 g/dL — CL (ref 12.0–15.0)
Hemoglobin: 7.9 g/dL — ABNORMAL LOW (ref 12.0–15.0)
Hemoglobin: 5 g/dL — CL (ref 12.0–15.0)
MCH: 36.8 pg — ABNORMAL HIGH (ref 26.0–34.0)
MCH: 38.8 pg — ABNORMAL HIGH (ref 26.0–34.0)
MCH: 41.9 pg — ABNORMAL HIGH (ref 26.0–34.0)
MCH: 43.9 pg — ABNORMAL HIGH (ref 26.0–34.0)
MCHC: 33.4 g/dL (ref 30.0–36.0)
MCHC: 33.5 g/dL (ref 30.0–36.0)
MCHC: 34 g/dL (ref 30.0–36.0)
MCHC: 32.8 g/dL (ref 30.0–36.0)
MCHC: 33.8 g/dL (ref 30.0–36.0)
MCV: 114 fL — ABNORMAL HIGH (ref 78.0–100.0)
MCV: 115.1 fL — ABNORMAL HIGH (ref 78.0–100.0)
MCV: 125.3 fL — ABNORMAL HIGH (ref 78.0–100.0)
Platelets: 297 10*3/uL (ref 150–400)
Platelets: 318 10*3/uL (ref 150–400)
RBC: 2.07 MIL/uL — ABNORMAL LOW (ref 3.87–5.11)
RDW: 21.9 % — ABNORMAL HIGH (ref 11.5–15.5)
RDW: 30 % — ABNORMAL HIGH (ref 11.5–15.5)
RDW: 30 % — ABNORMAL HIGH (ref 11.5–15.5)
RDW: 25 % — ABNORMAL HIGH (ref 11.5–15.5)
WBC: 10.1 10*3/uL (ref 4.0–10.5)

## 2010-04-23 LAB — RETICULOCYTES
RBC.: 1.27 MIL/uL — ABNORMAL LOW (ref 3.87–5.11)
Retic Ct Pct: >20 % — ABNORMAL HIGH (ref 0.4–3.1)

## 2010-04-23 LAB — URINALYSIS, ROUTINE W REFLEX MICROSCOPIC
Glucose, UA: NEGATIVE mg/dL
Leukocytes, UA: NEGATIVE
Nitrite: NEGATIVE
Specific Gravity, Urine: 1.014 (ref 1.005–1.030)
pH: 6 (ref 5.0–8.0)

## 2010-04-23 LAB — COMPREHENSIVE METABOLIC PANEL
ALT: 15 U/L (ref 0–35)
AST: 42 U/L — ABNORMAL HIGH (ref 0–37)
BUN: 5 mg/dL — ABNORMAL LOW (ref 6–23)
BUN: 7 mg/dL (ref 6–23)
CO2: 24 mEq/L (ref 19–32)
Calcium: 9.1 mg/dL (ref 8.4–10.5)
Calcium: 9.4 mg/dL (ref 8.4–10.5)
Calcium: 9.9 mg/dL (ref 8.4–10.5)
Creatinine, Ser: 0.63 mg/dL (ref 0.4–1.2)
Creatinine, Ser: 0.73 mg/dL (ref 0.4–1.2)
GFR calc Af Amer: >60 mL/min (ref >60)
GFR calc non Af Amer: >60 mL/min (ref >60)
GFR calc non Af Amer: >60 mL/min (ref >60)
Glucose, Bld: 90 mg/dL (ref 70–99)
Glucose, Bld: 91 mg/dL (ref 70–99)
Sodium: 140 mEq/L (ref 135–145)
Sodium: 137 mEq/L (ref 135–145)
Total Protein: 6.9 g/dL (ref 6.0–8.3)
Total Protein: 8.7 g/dL — ABNORMAL HIGH (ref 6.0–8.3)

## 2010-04-23 LAB — HEMOCCULT GUIAC POC 1CARD (OFFICE)
Fecal Occult Bld: NEGATIVE
Fecal Occult Bld: NEGATIVE

## 2010-04-23 LAB — RAPID URINE DRUG SCREEN, HOSP PERFORMED
Amphetamines: NOT DETECTED
Benzodiazepines: NOT DETECTED
Cocaine: NOT DETECTED
Opiates: POSITIVE — AB
Tetrahydrocannabinol: NOT DETECTED

## 2010-04-23 LAB — URINE MICROSCOPIC-ADD ON

## 2010-04-23 LAB — TYPE AND SCREEN: Antibody Screen: POSITIVE

## 2010-04-23 LAB — BASIC METABOLIC PANEL
BUN: 12 mg/dL (ref 6–23)
BUN: 8 mg/dL (ref 6–23)
CO2: 26 mEq/L (ref 19–32)
Calcium: 9.3 mg/dL (ref 8.4–10.5)
Calcium: 9.3 mg/dL (ref 8.4–10.5)
Chloride: 108 mEq/L (ref 96–112)
Chloride: 108 mEq/L (ref 96–112)
Creatinine, Ser: 0.56 mg/dL (ref 0.4–1.2)
Creatinine, Ser: 0.69 mg/dL (ref 0.4–1.2)
GFR calc non Af Amer: >60 mL/min (ref >60)
Glucose, Bld: 88 mg/dL (ref 70–99)
Glucose, Bld: 94 mg/dL (ref 70–99)
Potassium: 3.6 mEq/L (ref 3.5–5.1)
Sodium: 138 mEq/L (ref 135–145)

## 2010-04-23 LAB — HEMOGLOBINOPATHY EVALUATION: Hgb A2 Quant: 2.4 % (ref 2.2–3.2)

## 2010-04-23 LAB — CSF CULTURE W GRAM STAIN

## 2010-04-23 LAB — CULTURE, BLOOD (ROUTINE X 2)
Culture: NO GROWTH
Culture: NO GROWTH

## 2010-04-23 LAB — HAPTOGLOBIN
Haptoglobin: <6 mg/dL — ABNORMAL LOW (ref 16–200)
Haptoglobin: <6 mg/dL — ABNORMAL LOW (ref 16–200)

## 2010-04-23 LAB — CARDIAC PANEL(CRET KIN+CKTOT+MB+TROPI)
CK, MB: 0.3 ng/mL (ref 0.3–4.0)
CK, MB: 0.4 ng/mL (ref 0.3–4.0)
Total CK: 53 U/L (ref 7–177)
Total CK: 75 U/L (ref 7–177)

## 2010-04-23 LAB — IRON AND TIBC
Saturation Ratios: 26 % (ref 20–55)
TIBC: 359 ug/dL (ref 250–470)

## 2010-04-23 LAB — VITAMIN B12: Vitamin B-12: 435 pg/mL (ref 211–911)

## 2010-04-23 LAB — DIFFERENTIAL
Basophils Absolute: 0 10*3/uL (ref 0.0–0.1)
Eosinophils Relative: 1 % (ref 0–5)
Lymphocytes Relative: 16 % (ref 12–46)
Monocytes Relative: 5 % (ref 3–12)
Neutrophils Relative %: 78 % — ABNORMAL HIGH (ref 43–77)

## 2010-04-23 LAB — CMV IGM: CMV IgM: <8 AU/mL (ref <30.0)

## 2010-04-23 LAB — GLUCOSE, CSF: Glucose, CSF: 54 mg/dL (ref 43–76)

## 2010-04-23 LAB — CSF CELL COUNT WITH DIFFERENTIAL
RBC Count, CSF: 2 /mm3 — ABNORMAL HIGH
WBC, CSF: 2 /mm3 (ref 0–5)

## 2010-04-23 LAB — MONONUCLEOSIS SCREEN: Mono Screen: NEGATIVE

## 2010-04-23 LAB — GRAM STAIN

## 2010-04-23 LAB — URINE CULTURE

## 2010-04-23 LAB — LACTATE DEHYDROGENASE: LDH: 704 U/L — ABNORMAL HIGH (ref 94–250)

## 2010-04-23 LAB — GLUCOSE 6 PHOSPHATE DEHYDROGENASE: G-6-PD, Quant: 14.1 U/g{Hb} — ABNORMAL HIGH (ref 4.6–13.5)

## 2010-04-23 LAB — DIRECT ANTIGLOBULIN TEST (NOT AT ARMC): DAT, IgG: POSITIVE

## 2010-05-11 ENCOUNTER — Encounter (HOSPITAL_BASED_OUTPATIENT_CLINIC_OR_DEPARTMENT_OTHER): Payer: BC Managed Care – PPO | Admitting: Oncology

## 2010-05-11 ENCOUNTER — Other Ambulatory Visit: Payer: Self-pay | Admitting: Oncology

## 2010-05-11 LAB — CBC WITH DIFFERENTIAL/PLATELET
BASO%: 0.2 % (ref 0.0–2.0)
EOS%: 1.4 % (ref 0.0–7.0)
HGB: 11.6 g/dL (ref 11.6–15.9)
MCH: 34.3 pg — ABNORMAL HIGH (ref 25.1–34.0)
MCHC: 34.8 g/dL (ref 31.5–36.0)
MONO%: 8.1 % (ref 0.0–14.0)
RBC: 3.38 10*6/uL — ABNORMAL LOW (ref 3.70–5.45)
RDW: 14.8 % — ABNORMAL HIGH (ref 11.2–14.5)
lymph#: 2.5 10*3/uL (ref 0.9–3.3)

## 2010-06-08 ENCOUNTER — Encounter (HOSPITAL_BASED_OUTPATIENT_CLINIC_OR_DEPARTMENT_OTHER): Payer: BC Managed Care – PPO | Admitting: Oncology

## 2010-06-08 ENCOUNTER — Other Ambulatory Visit: Payer: Self-pay | Admitting: Oncology

## 2010-06-08 LAB — CBC WITH DIFFERENTIAL/PLATELET
Basophils Absolute: 0 10*3/uL (ref 0.0–0.1)
EOS%: 1.2 % (ref 0.0–7.0)
Eosinophils Absolute: 0.1 10*3/uL (ref 0.0–0.5)
HGB: 13 g/dL (ref 11.6–15.9)
NEUT#: 3.6 10*3/uL (ref 1.5–6.5)
RBC: 3.94 10*6/uL (ref 3.70–5.45)
RDW: 13.7 % (ref 11.2–14.5)
WBC: 6.1 10*3/uL (ref 3.9–10.3)
lymph#: 1.9 10*3/uL (ref 0.9–3.3)

## 2010-07-20 ENCOUNTER — Encounter (HOSPITAL_BASED_OUTPATIENT_CLINIC_OR_DEPARTMENT_OTHER): Payer: BC Managed Care – PPO | Admitting: Oncology

## 2010-07-20 ENCOUNTER — Other Ambulatory Visit: Payer: Self-pay | Admitting: Oncology

## 2010-07-20 LAB — CBC WITH DIFFERENTIAL/PLATELET
Basophils Absolute: 0 10*3/uL (ref 0.0–0.1)
Eosinophils Absolute: 0.1 10*3/uL (ref 0.0–0.5)
HGB: 13.5 g/dL (ref 11.6–15.9)
MCV: 92 fL (ref 79.5–101.0)
MONO#: 0.7 10*3/uL (ref 0.1–0.9)
MONO%: 10.9 % (ref 0.0–14.0)
NEUT#: 3.2 10*3/uL (ref 1.5–6.5)
Platelets: 286 10*3/uL (ref 145–400)
RBC: 4.29 10*6/uL (ref 3.70–5.45)
RDW: 13.1 % (ref 11.2–14.5)
WBC: 6.6 10*3/uL (ref 3.9–10.3)

## 2010-09-28 ENCOUNTER — Other Ambulatory Visit: Payer: Self-pay | Admitting: Oncology

## 2010-09-28 ENCOUNTER — Encounter (HOSPITAL_BASED_OUTPATIENT_CLINIC_OR_DEPARTMENT_OTHER): Payer: BC Managed Care – PPO | Admitting: Oncology

## 2010-09-28 LAB — CBC WITH DIFFERENTIAL/PLATELET
Basophils Absolute: 0 10*3/uL (ref 0.0–0.1)
Eosinophils Absolute: 0.1 10*3/uL (ref 0.0–0.5)
HCT: 37.1 % (ref 34.8–46.6)
LYMPH%: 29.6 % (ref 14.0–49.7)
MCV: 90.7 fL (ref 79.5–101.0)
MONO#: 0.9 10*3/uL (ref 0.1–0.9)
MONO%: 10.4 % (ref 0.0–14.0)
NEUT#: 5.3 10*3/uL (ref 1.5–6.5)
NEUT%: 58.7 % (ref 38.4–76.8)
Platelets: 304 10*3/uL (ref 145–400)
RBC: 4.09 10*6/uL (ref 3.70–5.45)
WBC: 9 10*3/uL (ref 3.9–10.3)

## 2010-09-28 LAB — COMPREHENSIVE METABOLIC PANEL WITH GFR
ALT: 13 U/L (ref 0–35)
AST: 16 U/L (ref 0–37)
Albumin: 4.3 g/dL (ref 3.5–5.2)
Alkaline Phosphatase: 45 U/L (ref 39–117)
BUN: 14 mg/dL (ref 6–23)
CO2: 21 meq/L (ref 19–32)
Calcium: 9.7 mg/dL (ref 8.4–10.5)
Chloride: 105 meq/L (ref 96–112)
Creatinine, Ser: 0.74 mg/dL (ref 0.50–1.10)
Glucose, Bld: 105 mg/dL — ABNORMAL HIGH (ref 70–99)
Potassium: 4.2 meq/L (ref 3.5–5.3)
Sodium: 136 meq/L (ref 135–145)
Total Bilirubin: 0.3 mg/dL (ref 0.3–1.2)
Total Protein: 7.5 g/dL (ref 6.0–8.3)

## 2010-09-28 LAB — LACTATE DEHYDROGENASE: LDH: 137 U/L (ref 94–250)

## 2010-11-13 LAB — CULTURE, ROUTINE-ABSCESS

## 2010-11-16 IMAGING — CR DG CHEST 2V
2 series · 2 of 2 positions shown · non-contrast
Comparison: None.

CLINICAL DATA: Fever, hemolysis

CHEST - 2 VIEW

[w chest pa]
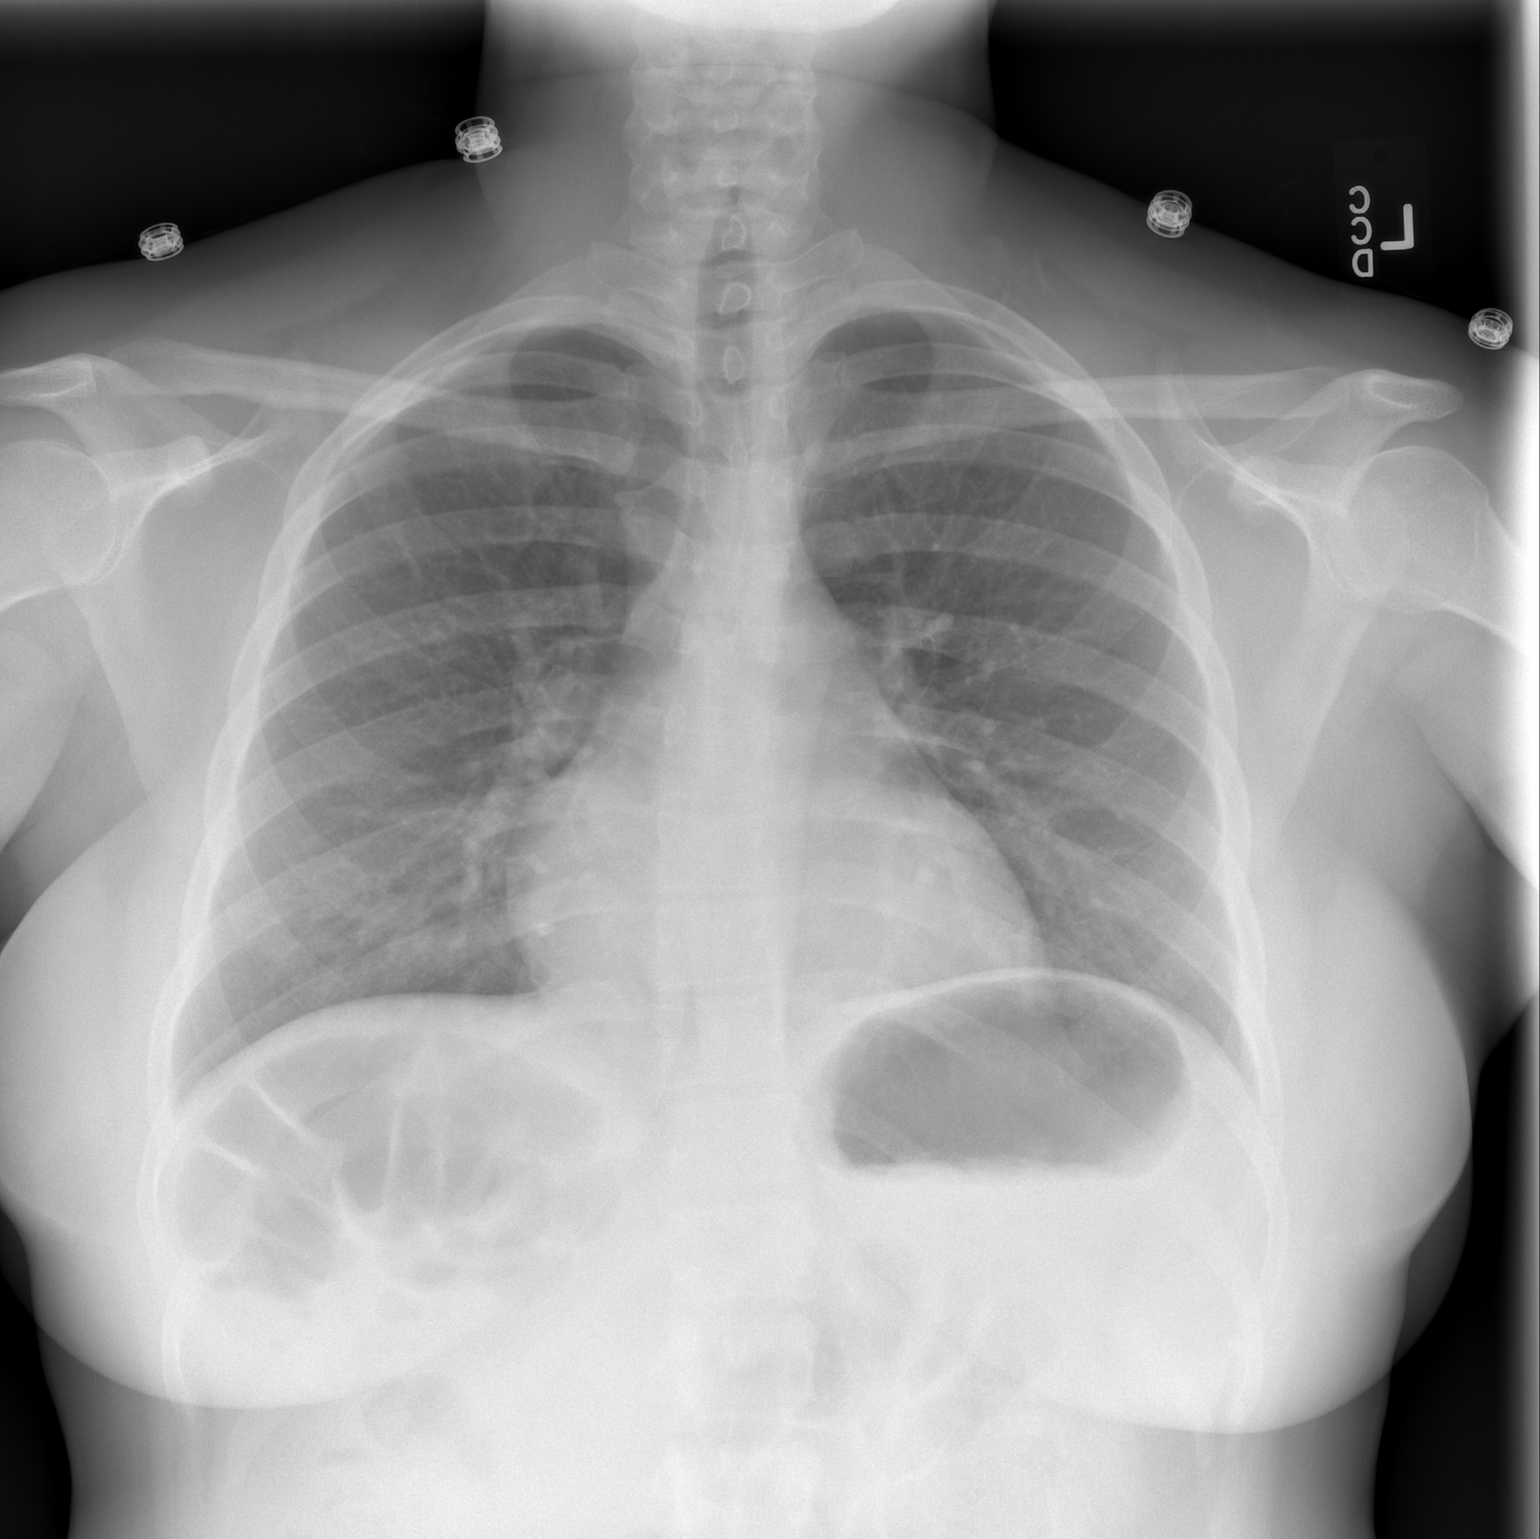

[w chest lat]
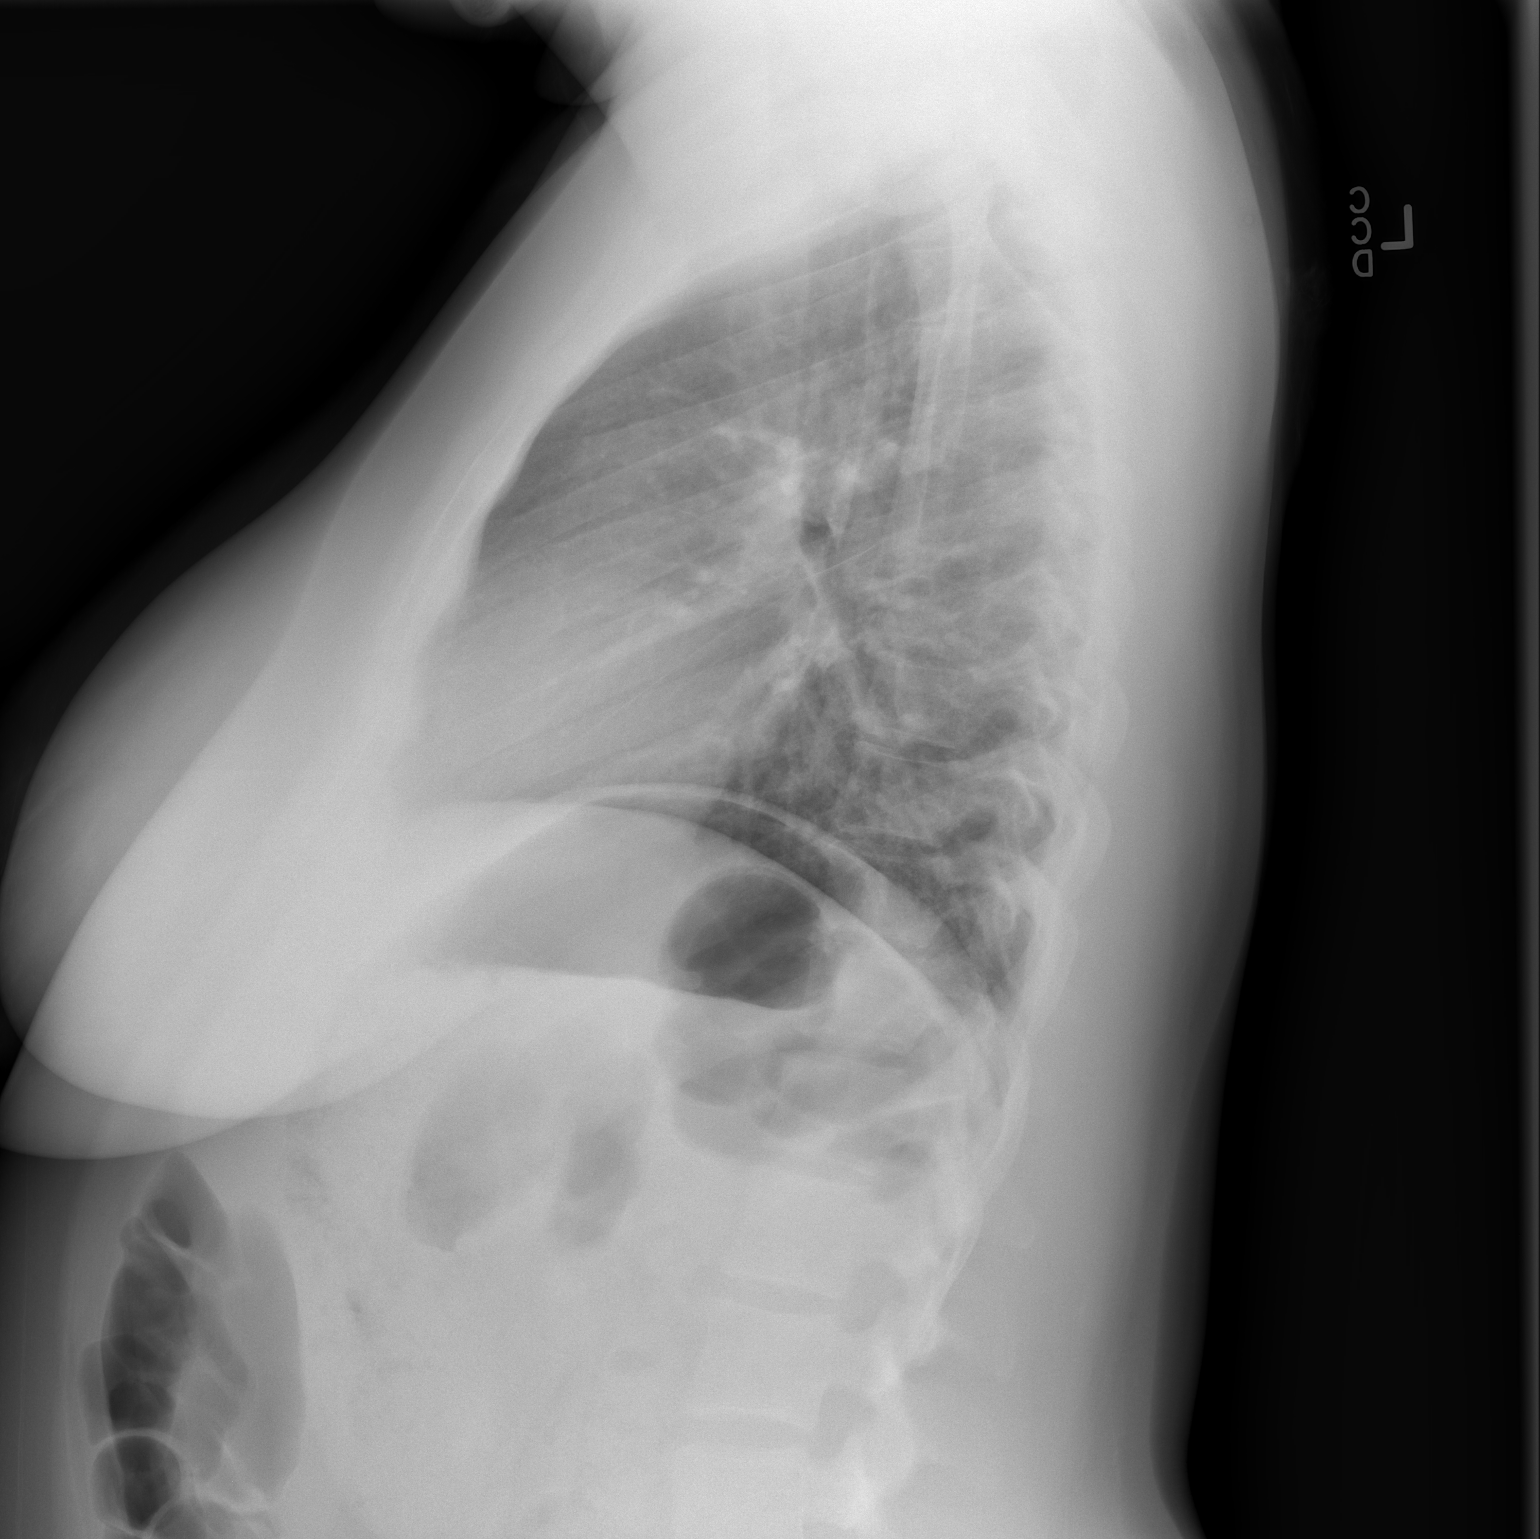

[2 of 2 positions shown; findings below may reference images not displayed]

FINDINGS: The heart size and mediastinal contours are within
normal limits.  Both lungs are clear.  The visualized skeletal
structures are unremarkable.
IMPRESSION: No active cardiopulmonary disease.

## 2011-12-02 ENCOUNTER — Emergency Department (HOSPITAL_COMMUNITY)
Admission: EM | Admit: 2011-12-02 | Discharge: 2011-12-02 | Disposition: A | Discharge status: Home / Self Care | Payer: 59 | Attending: Emergency Medicine | Admitting: Emergency Medicine

## 2011-12-02 ENCOUNTER — Encounter (HOSPITAL_COMMUNITY): Payer: Self-pay | Admitting: Emergency Medicine

## 2011-12-02 DIAGNOSIS — L42 Pityriasis rosea: Secondary | ICD-10-CM | POA: Insufficient documentation

## 2011-12-02 DIAGNOSIS — Z87891 Personal history of nicotine dependence: Secondary | ICD-10-CM | POA: Insufficient documentation

## 2011-12-02 DIAGNOSIS — Z862 Personal history of diseases of the blood and blood-forming organs and certain disorders involving the immune mechanism: Secondary | ICD-10-CM | POA: Insufficient documentation

## 2011-12-02 HISTORY — DX: Hereditary hemolytic anemia, unspecified: D58.9

## 2011-12-02 MED ORDER — HYDROXYZINE HCL 25 MG PO TABS
50.0000 mg | ORAL_TABLET | Freq: Once | ORAL | Status: AC
  Administered 2011-12-02: 50 mg via ORAL
  Filled 2011-12-02: qty 2

## 2011-12-02 MED ORDER — HYDROCORTISONE 1 % EX CREA: TOPICAL_CREAM | CUTANEOUS | Status: DC

## 2011-12-02 MED ORDER — HYDROXYZINE HCL 10 MG PO TABS: 10.0000 mg | ORAL_TABLET | Freq: Three times a day (TID) | ORAL | Status: DC | PRN

## 2011-12-02 NOTE — ED Notes (Signed)
Pt states that phlebotomy has collected blood for her lab.

## 2011-12-02 NOTE — ED Provider Notes (Signed)
Medical screening examination/treatment/procedure(s) were performed by non-physician practitioner and as supervising physician I was immediately available for consultation/collaboration.   Rolan Bucco, MD 12/02/11 [phone]

## 2011-12-02 NOTE — ED Notes (Signed)
Patient states a rash began on her chest about a month ago that spread over her entire body.  Patient reports itching, pain, and redness.  Rash is noted in between fingers.

## 2011-12-02 NOTE — ED Provider Notes (Signed)
History     CSN: [id number]  Arrival date & time 12/02/11  1258   First MD Initiated Contact with Patient 12/02/11 1343      Chief Complaint  Patient presents with  . Rash    (Consider location/radiation/quality/duration/timing/severity/associated sxs/prior treatment) HPI Comments: Patient presents with rash X 1 month. Patient states the rash is diffuse and includes her arms and trunk. Denies fever or chills. Denies change in detergent, lotion, or soap. Denies new medications.  The history is provided by the patient. No language interpreter was used.    Past Medical History  Diagnosis Date  . Hemolytic anemia     Past Surgical History  Procedure Date  . Splenectomy     No family history on file.  History  Substance Use Topics  . Smoking status: Former Games developer  . Smokeless tobacco: Not on file  . Alcohol Use: Yes    OB History    Grav Para Term Preterm Abortions TAB SAB Ect Mult Living                  Review of Systems  Constitutional: Negative for fever and chills.  Skin: Positive for rash.    Allergies  Review of patient's allergies indicates no known allergies.  Home Medications   Current Outpatient Rx  Name Route Sig Dispense Refill  . ACETAMINOPHEN 500 MG PO TABS Oral Take 500 mg by mouth every 6 (six) hours as needed. pain    . HYDROCORTISONE 0.5 % EX CREA Topical Apply 1 application topically 2 (two) times daily.    Marland Kitchen HYDROCORTISONE 1 % EX CREA  Apply to affected area 2 times daily 15 g 0  . HYDROXYZINE HCL 10 MG PO TABS Oral Take 1 tablet (10 mg total) by mouth 3 (three) times daily as needed for itching. 30 tablet 0    BP 132/81  Pulse 60  Temp 98.9 F (37.2 C) (Oral)  Resp 16  Ht 5\' 6"  (1.676 m)  Wt 197 lb (89.359 kg)  BMI 31.80 kg/m2  SpO2 100%  LMP 11/27/2011  Physical Exam  Nursing note and vitals reviewed. Constitutional: She appears well-developed and well-nourished.  HENT:  Head: Normocephalic and atraumatic.    Mouth/Throat: Oropharynx is clear and moist.  Eyes: Conjunctivae normal and EOM are normal. No scleral icterus.  Neck: Normal range of motion. Neck supple.  Cardiovascular: Normal rate, regular rhythm and normal heart sounds.   Pulmonary/Chest: Effort normal and breath sounds normal.  Abdominal: Soft. Bowel sounds are normal. There is no tenderness.  Neurological: She is alert.  Skin: Skin is warm.       ED Course  Procedures (including critical care time)   Labs Reviewed  RPR   No results found.   1. Pityriasis rosea       MDM  Patient presented with rash X 1 month. Rash presentation consistent with pityriasis rosea. Patient discharged with hydroxyzine. Patient educated that this rash is self limiting. RPR drawn in case this rash is secondary syphilis as both rashes have similar appearance. Discharged with return precautions.        Pixie Casino, PA-C 12/02/11 1520

## 2012-03-22 ENCOUNTER — Other Ambulatory Visit: Payer: Self-pay

## 2012-04-16 DIAGNOSIS — A749 Chlamydial infection, unspecified: Secondary | ICD-10-CM

## 2012-04-16 HISTORY — DX: Chlamydial infection, unspecified: A74.9

## 2012-06-19 ENCOUNTER — Telehealth: Payer: Self-pay | Admitting: Gynecology

## 2012-06-19 NOTE — Telephone Encounter (Signed)
[facility] calling to follow up on std testing.

## 2012-06-23 NOTE — Telephone Encounter (Signed)
Brandy at [facility] dept calling to check on what/if any treatment was given for her positive Chlamydia culture. Advised that Zithromax 1gm was called in for patient on 04-21-12. [facility]

## 2012-12-11 ENCOUNTER — Other Ambulatory Visit: Payer: Self-pay

## 2012-12-27 ENCOUNTER — Telehealth: Payer: Self-pay | Admitting: General Practice

## 2012-12-27 NOTE — Telephone Encounter (Signed)
Pt came in today to receive the flu shot °

## 2013-03-20 ENCOUNTER — Encounter: Payer: Self-pay | Admitting: Gynecology

## 2013-04-22 ENCOUNTER — Ambulatory Visit: Payer: Self-pay | Admitting: Gynecology

## 2013-04-28 ENCOUNTER — Ambulatory Visit: Payer: Self-pay | Admitting: Gynecology

## 2013-04-29 ENCOUNTER — Emergency Department (HOSPITAL_COMMUNITY)
Admission: EM | Admit: 2013-04-29 | Discharge: 2013-04-29 | Disposition: A | Discharge status: Home / Self Care | Payer: 59 | Source: Home / Self Care

## 2013-04-29 ENCOUNTER — Encounter (HOSPITAL_COMMUNITY): Payer: Self-pay | Admitting: Emergency Medicine

## 2013-04-29 DIAGNOSIS — Z23 Encounter for immunization: Secondary | ICD-10-CM

## 2013-04-29 DIAGNOSIS — L03011 Cellulitis of right finger: Secondary | ICD-10-CM

## 2013-04-29 DIAGNOSIS — L03019 Cellulitis of unspecified finger: Secondary | ICD-10-CM

## 2013-04-29 MED ORDER — CEPHALEXIN 500 MG PO CAPS: 500.0000 mg | ORAL_CAPSULE | Freq: Four times a day (QID) | ORAL | Status: DC

## 2013-04-29 MED ORDER — TETANUS-DIPHTH-ACELL PERTUSSIS 5-2.5-18.5 LF-MCG/0.5 IM SUSP
INTRAMUSCULAR | Status: AC
  Filled 2013-04-29: qty 0.5

## 2013-04-29 MED ORDER — TETANUS-DIPHTH-ACELL PERTUSSIS 5-2.5-18.5 LF-MCG/0.5 IM SUSP
0.5000 mL | Freq: Once | INTRAMUSCULAR | Status: AC
  Administered 2013-04-29: 0.5 mL via INTRAMUSCULAR

## 2013-04-29 NOTE — ED Provider Notes (Signed)
CSN: [id number]     Arrival date & time 04/29/13  1420 History   First MD Initiated Contact with Patient 04/29/13 1616     Chief Complaint  Patient presents with  . Hand Pain   (Consider location/radiation/quality/duration/timing/severity/associated sxs/prior Treatment) HPI Comments: 28 year old female complaining of a gradual onset of pain and swelling to the right index finger. The greatest amount of discomfort is in the distal phalanx. There is minor swelling to the face and radial aspect of the nail. Denies any known injury or overuse of the digit it may have injured the nail. The nails are painted.   Past Medical History  Diagnosis Date  . Hemolytic anemia    Past Surgical History  Procedure Laterality Date  . Splenectomy     History reviewed. No pertinent family history. History  Substance Use Topics  . Smoking status: Former Research scientist (life sciences)  . Smokeless tobacco: Not on file  . Alcohol Use: Yes   OB History   Grav Para Term Preterm Abortions TAB SAB Ect Mult Living                 Review of Systems  Constitutional: Negative for fever and activity change.  Respiratory: Negative.   Cardiovascular: Negative.   Musculoskeletal: Negative for arthralgias, back pain, gait problem, myalgias and neck pain.       As per HPI  Skin: Negative for color change, pallor and rash.  Neurological: Negative.     Allergies  Review of patient's allergies indicates no known allergies.  Home Medications   Current Outpatient Rx  Name  Route  Sig  Dispense  Refill  . acetaminophen (TYLENOL) 500 MG tablet   Oral   Take 500 mg by mouth every 6 (six) hours as needed. pain         . cephALEXin (KEFLEX) 500 MG capsule   Oral   Take 1 capsule (500 mg total) by mouth 4 (four) times daily.   40 capsule   0   . hydrocortisone cream 0.5 %   Topical   Apply 1 application topically 2 (two) times daily.         . hydrocortisone cream 1 %      Apply to affected area 2 times daily   15 g  0   . hydrOXYzine (ATARAX/VISTARIL) 10 MG tablet   Oral   Take 1 tablet (10 mg total) by mouth 3 (three) times daily as needed for itching.   30 tablet   0    BP 122/62  Pulse 87  Temp(Src) 98.6 F (37 C) (Oral)  Resp 16  SpO2 100% Physical Exam  Nursing note and vitals reviewed. Constitutional: She is oriented to person, place, and time. She appears well-developed and well-nourished. No distress.  HENT:  Head: Normocephalic and atraumatic.  Eyes: EOM are normal.  Neck: Normal range of motion. Neck supple.  Pulmonary/Chest: Effort normal. No respiratory distress.  Musculoskeletal:  Right index finger is able to flex approximately 90% of full range of motion. Extension to 180 degrees normal. Nontender to the proximal or middle phalanges. Minor tenderness to the DIP joint. Most tenderness is in the soft tissue of the distal phalanx around the nailbed. There is a slightly raised area of soft tissue around the nailbed however this does not appear to be fluctuant sufficiently to have I&D. The nailbed is intact. No erythema or lymphangitis.  Neurological: She is alert and oriented to person, place, and time. No cranial nerve deficit.  Skin: Skin  is warm and dry.  Psychiatric: She has a normal mood and affect.    ED Course  Procedures (including critical care time) Labs Review Labs Reviewed - No data to display Imaging Review No results found.   MDM   1. Acute paronychia of finger of right hand    Keflex 500 qid Warm soaks If nail border starts to swell, turn white or getting worse, return.  Read instructions carefully.    Janne Napoleon, NP 04/29/13 [phone]

## 2013-04-29 NOTE — Discharge Instructions (Signed)
Paronychia Paronychia is an inflammatory reaction involving the folds of the skin surrounding the fingernail. This is commonly caused by an infection in the skin around a nail. The most common cause of paronychia is frequent wetting of the hands (as seen with bartenders, food servers, nurses or others who wet their hands). This makes the skin around the fingernail susceptible to infection by bacteria (germs) or fungus. Other predisposing factors are:  Aggressive manicuring.  Nail biting.  Thumb sucking. The most common cause is a staphylococcal (a type of germ) infection, or a fungal (Candida) infection. When caused by a germ, it usually comes on suddenly with redness, swelling, pus and is often painful. It may get under the nail and form an abscess (collection of pus), or form an abscess around the nail. If the nail itself is infected with a fungus, the treatment is usually prolonged and may require oral medicine for up to one year. Your caregiver will determine the length of time treatment is required. The paronychia caused by bacteria (germs) may largely be avoided by not pulling on hangnails or picking at cuticles. When the infection occurs at the tips of the finger it is called felon. When the cause of paronychia is from the herpes simplex virus (HSV) it is called herpetic whitlow. TREATMENT  When an abscess is present treatment is often incision and drainage. This means that the abscess must be cut open so the pus can get out. When this is done, the following home care instructions should be followed. HOME CARE INSTRUCTIONS   It is important to keep the affected fingers very dry. Rubber or plastic gloves over cotton gloves should be used whenever the hand must be placed in water.  Keep wound clean, dry and dressed as suggested by your caregiver between warm soaks or warm compresses.  Soak in warm water for fifteen to twenty minutes three to four times per day for bacterial infections. Fungal  infections are very difficult to treat, so often require treatment for long periods of time.  For bacterial (germ) infections take antibiotics (medicine which kill germs) as directed and finish the prescription, even if the problem appears to be solved before the medicine is gone.  Only take over-the-counter or prescription medicines for pain, discomfort, or fever as directed by your caregiver. SEEK IMMEDIATE MEDICAL CARE IF:  You have redness, swelling, or increasing pain in the wound.  You notice pus coming from the wound.  You have a fever.  You notice a bad smell coming from the wound or dressing. Document Released: 07/18/2000 Document Revised: 04/16/2011 Document Reviewed: 03/19/2008 ExitCare Patient Information 2014 [employer].  

## 2013-04-29 NOTE — ED Notes (Signed)
Swollen, painful index finger. Limited ROM  No known injury

## 2013-04-30 NOTE — ED Provider Notes (Signed)
Medical screening examination/treatment/procedure(s) were performed by a resident physician or non-physician practitioner and as the supervising physician I was immediately available for consultation/collaboration.  Lynne Leader, MD    Gregor Hams, MD 04/30/13 [phone]

## 2013-05-05 ENCOUNTER — Encounter: Payer: Self-pay | Admitting: Gynecology

## 2013-05-06 ENCOUNTER — Ambulatory Visit (INDEPENDENT_AMBULATORY_CARE_PROVIDER_SITE_OTHER): Payer: 59 | Admitting: Gynecology

## 2013-05-06 ENCOUNTER — Encounter: Payer: Self-pay | Admitting: Gynecology

## 2013-05-06 VITALS — BP 122/42 | HR 90 | Resp 20 | Ht 66.0 in | Wt 180.0 lb

## 2013-05-06 DIAGNOSIS — Z01419 Encounter for gynecological examination (general) (routine) without abnormal findings: Secondary | ICD-10-CM

## 2013-05-06 DIAGNOSIS — D219 Benign neoplasm of connective and other soft tissue, unspecified: Secondary | ICD-10-CM

## 2013-05-06 DIAGNOSIS — N898 Other specified noninflammatory disorders of vagina: Secondary | ICD-10-CM

## 2013-05-06 DIAGNOSIS — L309 Dermatitis, unspecified: Secondary | ICD-10-CM | POA: Insufficient documentation

## 2013-05-06 DIAGNOSIS — D589 Hereditary hemolytic anemia, unspecified: Secondary | ICD-10-CM | POA: Insufficient documentation

## 2013-05-06 DIAGNOSIS — Z0000 Encounter for general adult medical examination without abnormal findings: Secondary | ICD-10-CM

## 2013-05-06 DIAGNOSIS — D259 Leiomyoma of uterus, unspecified: Secondary | ICD-10-CM

## 2013-05-06 DIAGNOSIS — A749 Chlamydial infection, unspecified: Secondary | ICD-10-CM

## 2013-05-06 LAB — POCT URINALYSIS DIPSTICK
PH UA: 5
Urobilinogen, UA: NEGATIVE

## 2013-05-06 LAB — HEMOGLOBIN, FINGERSTICK: Hemoglobin, fingerstick: 12 g/dL (ref 12.0–16.0)

## 2013-05-06 MED ORDER — FLUCONAZOLE 150 MG PO TABS: 150.0000 mg | ORAL_TABLET | Freq: Once | ORAL | Status: DC

## 2013-05-06 NOTE — Patient Instructions (Signed)
Fibroids Fibroids are lumps (tumors) that can occur any place in a woman's body. These lumps are not cancerous. Fibroids vary in size, weight, and where they grow. HOME CARE  Do not take aspirin.  Write down the number of pads or tampons you use during your period. Tell your doctor. This can help determine the best treatment for you. GET HELP RIGHT AWAY IF:  You have pain in your lower belly (abdomen) that is not helped with medicine.  You have cramps that are not helped with medicine.  You have more bleeding between or during your period.  You feel lightheaded or pass out (faint).  Your lower belly pain gets worse. MAKE SURE YOU:  Understand these instructions.  Will watch your condition.  Will get help right away if you are not doing well or get worse. Document Released: 02/24/2010 Document Revised: 04/16/2011 Document Reviewed: 02/24/2010 Exit[employer] Patient Information 2014 [employer].  

## 2013-05-06 NOTE — Progress Notes (Signed)
28 y.o. Single African American female   [id number] here for annual exam. Pt is  currently sexually active.  She reports not using condoms on a regular basis.  First sexual activity at 28 years old, 10 number of lifetime partners.   Pt had +CTM last year with same partner as last year, both treated, had negative test of cure at [facility]. no dyspareunia.  Current partner 4y. No contraception but not planning pregnancy . Recent finger infection on antibiotics, now with itch and discharge  Patient's last menstrual period was 04/23/2013.          Sexually active: yes  The current method of family planning is none.    Exercising: no  The patient does not participate in regular exercise at present. Last pap: 04/16/12 ASCUS -  HR HPV Alcohol: Socially  Tobacco: no Drugs: no Gardisil: no, completed:  Hgb: 12.0 ; Urine: Leuks 1, Trace Protein    Health Maintenance  Topic Date Due  . Influenza Vaccine  09/05/2013  . Pap Smear  04/17/2015  . Tetanus/tdap  04/30/2023    Family History  Problem Relation Age of Onset  . Diabetes Mother   . Diabetes Brother   . Diabetes Maternal Grandmother   . Diabetes Paternal Grandmother   . Hypertension Mother   . Hypertension Paternal Grandmother   . Thyroid disease Father     Thyroid removed  . Depression Mother     There are no active problems to display for this patient.   Past Medical History  Diagnosis Date  . 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 Chlamydia 04/16/12    Treated     Past Surgical History  Procedure Laterality Date  . Splenectomy  2012   . Tonsillectomy  1990    Allergies: Review of patient's allergies indicates no known allergies.  Current Outpatient Prescriptions  Medication Sig Dispense Refill  . acetaminophen (TYLENOL) 500 MG tablet Take 500 mg by mouth every 6 (six) hours as needed. pain      . cephALEXin (KEFLEX) 500 MG capsule Take 1 capsule (500 mg total) by mouth 4 (four) times daily.  40 capsule  0  .  hydrocortisone cream 0.5 % Apply 1 application topically 2 (two) times daily.      . hydrocortisone cream 1 % Apply to affected area 2 times daily  15 g  0  . hydrOXYzine (ATARAX/VISTARIL) 10 MG tablet Take 1 tablet (10 mg total) by mouth 3 (three) times daily as needed for itching.  30 tablet  0  . Multiple Vitamins-Minerals (MULTIVITAMIN PO) Take by mouth.       No current facility-administered medications for this visit.    ROS: Pertinent items are noted in HPI.  Exam:    Ht 5\' 6"  (1.676 m)  Wt 180 lb (81.647 kg)  BMI 29.07 kg/m2  LMP 04/23/2013 Weight change: @WEIGHTCHANGE @ Last 3 height recordings:  Ht Readings from Last 3 Encounters:  05/06/13 5\' 6"  (1.676 m)  12/02/11 5\' 6"  (1.676 m)   General appearance: alert, cooperative and appears stated age Head: Normocephalic, without obvious abnormality, atraumatic Neck: no adenopathy, no carotid bruit, no JVD, supple, symmetrical, trachea midline and thyroid not enlarged, symmetric, no tenderness/mass/nodules Lungs: clear to auscultation bilaterally Breasts: normal appearance, no masses or tenderness Heart: regular rate and rhythm, S1, S2 normal, no murmur, click, rub or gallop Abdomen: soft, non-tender; bowel sounds normal; no masses,  no organomegaly Extremities: extremities normal, atraumatic, no cyanosis or edema Skin:  Skin color, texture, turgor normal. No rashes or lesions Lymph nodes: Cervical, supraclavicular, and axillary nodes normal. no inguinal nodes palpated Neurologic: Grossly normal   Pelvic: External genitalia:  no lesions              Urethra: normal appearing urethra with no masses, tenderness or lesions              Bartholins and Skenes: Bartholin's, Urethra, Skene's normal                 Vagina: normal appearing vagina, thick white discharge              Cervix: normal appearance              Pap taken: no        Bimanual Exam:  Uterus:  enlarged to 8 week's size, irregular                                       Adnexa:    no masses                                      Rectovaginal: Confirms                                      Anus:  normal sphincter tone, no lesions  A: well woman Recent CTM infection Irregular uterus Yeast vaginitis     P: PUS GC/CTM from cervix diflucan counseled on breast self exam, HIV risk factors and prevention, family planning choices, adequate intake of calcium and vitamin D, diet and exercise return annually or prn Discussed STD prevention, regular condom use.     An After Visit Summary was printed and given to the patient.

## 2013-05-07 ENCOUNTER — Telehealth: Payer: Self-pay | Admitting: Gynecology

## 2013-05-07 NOTE — Telephone Encounter (Signed)
Patient returned call. I advised that her responsibility for PUS would be $465.96 based on the insurance quote received and that payment is due at the time of service. Patient states that she cannot afford this right now and asked about payment plan.

## 2013-05-07 NOTE — Telephone Encounter (Signed)
Left message for patient to call back. Need to go over benefits and schedule PUS °

## 2013-05-08 LAB — IPS N GONORRHOEA AND CHLAMYDIA BY PCR

## 2013-05-08 NOTE — Telephone Encounter (Signed)
Payment plan is fine.  Have her do promissory note.  Encounter closed.

## 2013-05-11 NOTE — Telephone Encounter (Signed)
Left message for patient to call back  

## 2013-05-15 NOTE — Telephone Encounter (Signed)
Left message for patient to call back  

## 2013-05-16 ENCOUNTER — Emergency Department (HOSPITAL_COMMUNITY)
Admission: EM | Admit: 2013-05-16 | Discharge: 2013-05-16 | Disposition: A | Discharge status: Home / Self Care | Payer: 59 | Attending: Emergency Medicine | Admitting: Emergency Medicine

## 2013-05-16 ENCOUNTER — Encounter (HOSPITAL_COMMUNITY): Payer: Self-pay | Admitting: Emergency Medicine

## 2013-05-16 DIAGNOSIS — L03019 Cellulitis of unspecified finger: Secondary | ICD-10-CM | POA: Insufficient documentation

## 2013-05-16 DIAGNOSIS — L03011 Cellulitis of right finger: Secondary | ICD-10-CM

## 2013-05-16 DIAGNOSIS — Z862 Personal history of diseases of the blood and blood-forming organs and certain disorders involving the immune mechanism: Secondary | ICD-10-CM | POA: Insufficient documentation

## 2013-05-16 DIAGNOSIS — Z8619 Personal history of other infectious and parasitic diseases: Secondary | ICD-10-CM | POA: Insufficient documentation

## 2013-05-16 MED ORDER — OXYCODONE-ACETAMINOPHEN 5-325 MG PO TABS
2.0000 | ORAL_TABLET | Freq: Once | ORAL | Status: AC
  Administered 2013-05-16: 2 via ORAL
  Filled 2013-05-16: qty 2

## 2013-05-16 MED ORDER — CLINDAMYCIN HCL 150 MG PO CAPS: 450.0000 mg | ORAL_CAPSULE | Freq: Three times a day (TID) | ORAL | Status: DC

## 2013-05-16 MED ORDER — HYDROCODONE-ACETAMINOPHEN 5-325 MG PO TABS: 1.0000 | ORAL_TABLET | ORAL | Status: DC | PRN

## 2013-05-16 NOTE — ED Provider Notes (Signed)
CSN: [id number]     Arrival date & time 05/16/13  1353 History  This chart was scribed for non-physician practitioner Harvie Heck working with [place], DO by Donato Schultz, ED Scribe. This patient was seen in room WTR8/WTR8 and the patient's care was started at 2:41 PM.     Chief Complaint  Patient presents with  . Hand Pain    swelling and pain in r/index finger  . Nail Problem    HPI Comments: Casey Chapman is a 28 y.o. female who presents to the Emergency Department complaining of constant right index finger pain that started a month ago.  The patient states that the swelling started 2 weeks ago.  The patient states that she went to urgent care for her symptoms and was prescribed antibiotics.  She states that she just finished her antibiotics and the pain is still present.  She denies drainage from the finger and fever as associated symptoms.  The patient states that she goes to the nail salon often to get her nails done.  She denies being allergic to any medications.   Patient is a 28 y.o. female presenting with hand pain. The history is provided by the patient. No language interpreter was used.  Hand Pain  Hand Pain Pertinent negatives include no fever.    Past Medical History  Diagnosis Date  . 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 Chlamydia 04/16/12    Treated   . Eczema    Past Surgical History  Procedure Laterality Date  . Splenectomy  2012   . Tonsillectomy  1990   Family History  Problem Relation Age of Onset  . Diabetes Mother   . Diabetes Brother   . Diabetes Maternal Grandmother   . Diabetes Paternal Grandmother   . Hypertension Mother   . Hypertension Paternal Grandmother   . Thyroid disease Father     Thyroid removed  . Depression Mother    History  Substance Use Topics  . Smoking status: Never Smoker   . Smokeless tobacco: Not on file  . Alcohol Use: Yes     Comment: Socially   OB History   Grav Para Term Preterm Abortions TAB SAB Ect Mult  Living   0 0 0 0 0 0 0 0 0 0      Review of Systems  Constitutional: Negative for fever.  Skin: Positive for wound (right index finger).  All other systems reviewed and are negative.     Allergies  Review of patient's allergies indicates no known allergies.  Home Medications   Current Outpatient Rx  Name  Route  Sig  Dispense  Refill  . acetaminophen (TYLENOL) 500 MG tablet   Oral   Take 500 mg by mouth every 6 (six) hours as needed. pain         . cephALEXin (KEFLEX) 500 MG capsule   Oral   Take 1 capsule (500 mg total) by mouth 4 (four) times daily.   40 capsule   0   . fluconazole (DIFLUCAN) 150 MG tablet   Oral   Take 1 tablet (150 mg total) by mouth once. Take one tablet.  Repeat in 48 hours if symptoms are not completely resolved.   2 tablet   0   . hydrocortisone cream 0.5 %   Topical   Apply 1 application topically as needed.          . Multiple Vitamins-Minerals (MULTIVITAMIN PO)   Oral   Take by mouth.  Triage Vitals: BP 110/66  Pulse 76  Temp(Src) 98.7 F (37.1 C) (Oral)  Resp 18  Wt 180 lb (81.647 kg)  SpO2 100%  LMP 04/23/2013  Physical Exam  Nursing note and vitals reviewed. Constitutional: She is oriented to person, place, and time. She appears well-developed and well-nourished.  HENT:  Head: Normocephalic and atraumatic.  Eyes: EOM are normal.  Neck: Normal range of motion.  Cardiovascular: Normal rate.   Pulmonary/Chest: Effort normal.  Musculoskeletal: Normal range of motion.  Neurological: She is alert and oriented to person, place, and time.  Skin: Skin is warm and dry.  Right index finger mild drainage at lateral nail fold, with associated swelling extending to PIP, soft pad. GOOD ROM, cap refill <2 seconds. Right middle finger, scant drainage to lateral nail fold, mild swelling, no erythema.  Psychiatric: She has a normal mood and affect. Her behavior is normal.    ED Course  INCISION AND DRAINAGE Date/Time:  05/16/2013 3:30 PM Performed by: Lorrine Kin Authorized by: Lorrine Kin Consent: Verbal consent obtained. Risks and benefits: risks, benefits and alternatives were discussed Consent given by: patient Patient understanding: patient states understanding of the procedure being performed Patient consent: the patient's understanding of the procedure matches consent given Procedure consent: procedure consent matches procedure scheduled Required items: required blood products, implants, devices, and special equipment available Patient identity confirmed: verbally with patient Time out: Immediately prior to procedure a "time out" was called to verify the correct patient, procedure, equipment, support staff and site/side marked as required. Type: abscess Body area: upper extremity Location details: right index finger Anesthesia: digital block Local anesthetic: lidocaine 2% without epinephrine Anesthetic total: 4 ml Patient sedated: no Scalpel size: 11 Incision type: single straight Complexity: complex Drainage: purulent and  bloody Drainage amount: scant Wound treatment: wound left open Packing material: none   (including critical care time) INCISION AND DRAINAGE Performed by: Lorrine Kin Consent: Verbal consent obtained. Risks and benefits: risks, benefits and alternatives were discussed Type: abscess  Body area: Right middle finger  Anesthesia: local infiltration  Incision was made with a scalpel.  Local anesthetic: lidocaine 2% without epinephrine  Anesthetic total: 3 ml  Complexity: simple  Drainage: purulent  Drainage amount: scant  Packing material: none  Patient tolerance: Patient tolerated the procedure well with no immediate complications.   DIAGNOSTIC STUDIES: Oxygen Saturation is 100% on room air.  COORDINATION OF CARE: 2:45 PM- Discussed a clinical suspicion of a paronychia in the patient's right index finger and draining the abscess in the  ED.  The patient agreed to the treatment plan.   Labs Review Labs Reviewed - No data to display Imaging Review No results found.   EKG Interpretation None      MDM   Final diagnoses:  Paronychia of finger of right hand   Pt with worsening finger swelling and pain, was prescribed Keflex as an out-patient.  I&D performed with mild drainage of Right index finger and right middle finger.  Will place the patient on Clindamycin as an out-pt Discussed lab results, imaging results, and treatment plan with the patient. Return precautions given. Reports understanding and no other concerns at this time.  Patient is stable for discharge at this time.   Meds given in ED:  Medications  oxyCODONE-acetaminophen (PERCOCET/ROXICET) 5-325 MG per tablet 2 tablet (2 tablets Oral Given 05/16/13 1508)    Discharge Medication List as of 05/16/2013  3:43 PM    START taking these medications  Details  clindamycin (CLEOCIN) 150 MG capsule Take 3 capsules (450 mg total) by mouth 3 (three) times daily., Starting 05/16/2013, Until Discontinued, Print    HYDROcodone-acetaminophen (NORCO/VICODIN) 5-325 MG per tablet Take 1 tablet by mouth every 4 (four) hours as needed., Starting 05/16/2013, Until Discontinued, Print       I personally performed the services described in this documentation, which was scribed in my presence. The recorded information has been reviewed and is accurate.  Lorrine Kin, PA-C 05/18/13 1306

## 2013-05-16 NOTE — Discharge Instructions (Signed)
Call for a follow up appointment with a Family or Primary Care Provider.  Call Dr. Amedeo Plenty for further evaluation of your finger infection. Return if Symptoms worsen.   Take medication as prescribed.

## 2013-05-16 NOTE — ED Notes (Signed)
Pt reports persistent pain and swelling around nail bed of r/index finger. Prescribed antibiotics completed

## 2013-05-19 NOTE — ED Provider Notes (Signed)
Medical screening examination/treatment/procedure(s) were performed by non-physician practitioner and as supervising physician I was immediately available for consultation/collaboration.   EKG Interpretation None        [place], DO 05/19/13 [phone]

## 2013-05-21 NOTE — Telephone Encounter (Signed)
PROMISSORY NOTE  This Promissory Note is entered into by:  Casey Chapman of [address] [place] [address]. (Responsible Party Name)                (Address)  [name], [address] [place], [place], [place]  [address]  The patient or responsible party agrees to pay the sum of $ 465.96  to [facility].  Payments are to be made on a monthly basis beginning:  April 28,2015  continuing monthly (payments will be $ 155.32 ) until the balance is paid in full.  Patient and/or responsible party understands that payment must be made every thirty (30) days regardless of receipt of a statement.  Patient and/or responsible party understands that any time payments are not made as required, account may be turned over to a collection agency with no further notice.  Any and all fees assessed due to collection agency referral will be the responsibility of the patient and/or responsible party.  Patient and/or responsible party understands that any Medicaid benefits are not a permissible form of payment now or in the future.

## 2013-05-21 NOTE — Telephone Encounter (Signed)
Spoke with patient. Advised that payment plan would be available for PUS. Patient responsibility of $465.96 will be paid in 3 payments of $155.31. 1st payment is to be made at the time of the procedure and the patient is to sign promissory note at this visit as well. Patient agreeable. Scheduled PUS. Mailed the In-Office procedure form that includes appointment date and time, patient copay, and cancellation policy. Also mailed promissory note, advising patient to bring it along with payment to visit.

## 2013-05-21 NOTE — Telephone Encounter (Signed)
Left message for patient to call back  

## 2013-06-02 ENCOUNTER — Ambulatory Visit (INDEPENDENT_AMBULATORY_CARE_PROVIDER_SITE_OTHER): Payer: 59

## 2013-06-02 ENCOUNTER — Ambulatory Visit (INDEPENDENT_AMBULATORY_CARE_PROVIDER_SITE_OTHER): Payer: 59 | Admitting: Gynecology

## 2013-06-02 VITALS — BP 112/62 | HR 70 | Resp 16 | Ht 66.0 in | Wt 180.0 lb

## 2013-06-02 DIAGNOSIS — D259 Leiomyoma of uterus, unspecified: Secondary | ICD-10-CM

## 2013-06-02 DIAGNOSIS — D219 Benign neoplasm of connective and other soft tissue, unspecified: Secondary | ICD-10-CM

## 2013-06-02 DIAGNOSIS — N852 Hypertrophy of uterus: Secondary | ICD-10-CM

## 2013-06-02 NOTE — Progress Notes (Signed)
      Pt here for f/u of irregular uterus on pelvic exam. Images reviewed with pt, uterus is normal , no fibroids,  Ovaries remarkable for resolving CL on left, right normal no free fluid.  Pt relieved. Questions addressed

## 2014-01-04 ENCOUNTER — Emergency Department (HOSPITAL_COMMUNITY): Payer: 59

## 2014-01-04 ENCOUNTER — Encounter (HOSPITAL_COMMUNITY): Payer: Self-pay

## 2014-01-04 ENCOUNTER — Emergency Department (HOSPITAL_COMMUNITY)
Admission: EM | Admit: 2014-01-04 | Discharge: 2014-01-04 | Disposition: A | Discharge status: Home / Self Care | Payer: 59 | Attending: Emergency Medicine | Admitting: Emergency Medicine

## 2014-01-04 DIAGNOSIS — R1031 Right lower quadrant pain: Secondary | ICD-10-CM | POA: Insufficient documentation

## 2014-01-04 DIAGNOSIS — Z8619 Personal history of other infectious and parasitic diseases: Secondary | ICD-10-CM | POA: Insufficient documentation

## 2014-01-04 DIAGNOSIS — Z872 Personal history of diseases of the skin and subcutaneous tissue: Secondary | ICD-10-CM | POA: Insufficient documentation

## 2014-01-04 DIAGNOSIS — Z79899 Other long term (current) drug therapy: Secondary | ICD-10-CM | POA: Insufficient documentation

## 2014-01-04 DIAGNOSIS — Z3202 Encounter for pregnancy test, result negative: Secondary | ICD-10-CM | POA: Insufficient documentation

## 2014-01-04 DIAGNOSIS — Z862 Personal history of diseases of the blood and blood-forming organs and certain disorders involving the immune mechanism: Secondary | ICD-10-CM | POA: Diagnosis not present

## 2014-01-04 DIAGNOSIS — R109 Unspecified abdominal pain: Secondary | ICD-10-CM

## 2014-01-04 LAB — URINALYSIS, ROUTINE W REFLEX MICROSCOPIC
Bilirubin Urine: NEGATIVE
Glucose, UA: NEGATIVE mg/dL
Hgb urine dipstick: NEGATIVE
Ketones, ur: NEGATIVE mg/dL
LEUKOCYTES UA: NEGATIVE
NITRITE: NEGATIVE
Protein, ur: NEGATIVE mg/dL
SPECIFIC GRAVITY, URINE: 1.021 (ref 1.005–1.030)
UROBILINOGEN UA: 0.2 mg/dL (ref 0.0–1.0)
pH: 6.5 (ref 5.0–8.0)

## 2014-01-04 LAB — CBC WITH DIFFERENTIAL/PLATELET
BASOS ABS: 0 10*3/uL (ref 0.0–0.1)
BASOS PCT: 0 % (ref 0–1)
EOS ABS: 0.1 10*3/uL (ref 0.0–0.7)
Eosinophils Relative: 1 % (ref 0–5)
HCT: 34.9 % — ABNORMAL LOW (ref 36.0–46.0)
HEMOGLOBIN: 11.3 g/dL — AB (ref 12.0–15.0)
Lymphocytes Relative: 39 % (ref 12–46)
Lymphs Abs: 4 10*3/uL (ref 0.7–4.0)
MCH: 29.9 pg (ref 26.0–34.0)
MCHC: 32.4 g/dL (ref 30.0–36.0)
MCV: 92.3 fL (ref 78.0–100.0)
MONO ABS: 1.5 10*3/uL — AB (ref 0.1–1.0)
Monocytes Relative: 15 % — ABNORMAL HIGH (ref 3–12)
NEUTROS PCT: 45 % (ref 43–77)
Neutro Abs: 4.6 10*3/uL (ref 1.7–7.7)
Platelets: 344 10*3/uL (ref 150–400)
RBC: 3.78 MIL/uL — ABNORMAL LOW (ref 3.87–5.11)
RDW: 14.5 % (ref 11.5–15.5)
WBC: 10.2 10*3/uL (ref 4.0–10.5)

## 2014-01-04 LAB — COMPREHENSIVE METABOLIC PANEL
ALBUMIN: 3.9 g/dL (ref 3.5–5.2)
ALT: 18 U/L (ref 0–35)
ANION GAP: 13 (ref 5–15)
AST: 23 U/L (ref 0–37)
Alkaline Phosphatase: 48 U/L (ref 39–117)
BUN: 10 mg/dL (ref 6–23)
CALCIUM: 9.7 mg/dL (ref 8.4–10.5)
CO2: 25 mEq/L (ref 19–32)
Chloride: 103 mEq/L (ref 96–112)
Creatinine, Ser: 0.81 mg/dL (ref 0.50–1.10)
GFR calc Af Amer: >90 mL/min (ref >90)
GFR calc non Af Amer: >90 mL/min (ref >90)
Glucose, Bld: 109 mg/dL — ABNORMAL HIGH (ref 70–99)
Potassium: 4.5 mEq/L (ref 3.7–5.3)
SODIUM: 141 meq/L (ref 137–147)
TOTAL PROTEIN: 7.8 g/dL (ref 6.0–8.3)
Total Bilirubin: <0.2 mg/dL — ABNORMAL LOW (ref 0.3–1.2)

## 2014-01-04 LAB — POC URINE PREG, ED: PREG TEST UR: NEGATIVE

## 2014-01-04 MED ORDER — FENTANYL CITRATE 0.05 MG/ML IJ SOLN
50.0000 ug | Freq: Once | INTRAMUSCULAR | Status: AC
  Administered 2014-01-04: 50 ug via INTRAVENOUS
  Filled 2014-01-04: qty 2

## 2014-01-04 MED ORDER — SODIUM CHLORIDE 0.9 % IV SOLN
Freq: Once | INTRAVENOUS | Status: AC
  Administered 2014-01-04: 04:00:00 via INTRAVENOUS

## 2014-01-04 MED ORDER — IOHEXOL 300 MG/ML  SOLN
50.0000 mL | Freq: Once | INTRAMUSCULAR | Status: AC | PRN
  Administered 2014-01-04: 50 mL via ORAL

## 2014-01-04 MED ORDER — IOHEXOL 300 MG/ML  SOLN
100.0000 mL | Freq: Once | INTRAMUSCULAR | Status: AC | PRN
  Administered 2014-01-04: 100 mL via INTRAVENOUS

## 2014-01-04 MED ORDER — TRAMADOL HCL 50 MG PO TABS: 50.0000 mg | ORAL_TABLET | Freq: Four times a day (QID) | ORAL | Status: DC | PRN

## 2014-01-04 MED ORDER — NAPROXEN 500 MG PO TABS: 500.0000 mg | ORAL_TABLET | Freq: Two times a day (BID) | ORAL | Status: DC

## 2014-01-04 NOTE — ED Provider Notes (Signed)
CSN: [id number]     Arrival date & time 01/04/14  0031 History   First MD Initiated Contact with Patient 01/04/14 0227     Chief Complaint  Patient presents with  . 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 She states that it is a throbbing sensation, nothing makes it better or worse. She has not had much appetite this evening, she denies any diarrhea or constipation but has had some loose stools, no dysuria hematuria or other urinary complaints. The pain does not radiate, there is no fevers chills and no nausea or vomiting.  Patient is a 28 y.o. female presenting with abdominal pain. The history is provided by the patient.  Abdominal Pain   Past Medical History  Diagnosis Date  . 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 Chlamydia 04/16/12    Treated   . Eczema    Past Surgical History  Procedure Laterality Date  . Splenectomy  2012   . Tonsillectomy  1990   Family History  Problem Relation Age of Onset  . Diabetes Mother   . Diabetes Brother   . Diabetes Maternal Grandmother   . Diabetes Paternal Grandmother   . Hypertension Mother   . Hypertension Paternal Grandmother   . Thyroid disease Father     Thyroid removed  . Depression Mother    History  Substance Use Topics  . Smoking status: Never Smoker   . Smokeless tobacco: Not on file  . Alcohol Use: Yes     Comment: Socially   OB History    Gravida Para Term Preterm AB TAB SAB Ectopic Multiple Living   0 0 0 0 0 0 0 0 0 0      Review of Systems  Gastrointestinal: Positive for abdominal pain.  All other systems reviewed and are negative.     Allergies  Review of patient's allergies indicates no known  allergies.  Home Medications   Prior to Admission medications   Medication Sig Start Date End Date Taking? Authorizing Provider  Multiple Vitamins-Minerals (MULTIVITAMIN PO) Take 1 tablet by mouth daily.    Yes Historical Provider, MD  acetaminophen (TYLENOL) 500 MG tablet Take 500 mg by mouth every 6 (six) hours as needed. pain    Historical Provider, MD  clindamycin (CLEOCIN) 150 MG capsule Take 3 capsules (450 mg total) by mouth 3 (three) times daily. Patient not taking: Reported on 01/04/2014 05/16/13   Harvie Heck, PA-C  fluconazole (DIFLUCAN) 150 MG tablet Take 1 tablet (150 mg total) by mouth once. Take one tablet.  Repeat in 48 hours if symptoms are not completely resolved. Patient not taking: Reported on 01/04/2014 05/06/13   Azalia Bilis, MD  hydrocortisone cream 0.5 % Apply 1 application topically daily as needed. Apply to arms and legs as needed    Historical Provider, MD  naproxen (NAPROSYN) 500 MG tablet Take 1 tablet (500 mg total) by mouth 2 (two) times daily with a meal. 01/04/14   Johnna Acosta, MD  traMADol (ULTRAM) 50 MG tablet Take 1 tablet (50 mg total) by mouth every 6 (six) hours as needed. 01/04/14   Johnna Acosta, MD   BP 117/55 mmHg  Pulse 79  Temp(Src) 98.6 F (37 C) (Oral)  Resp 14  SpO2 96%  LMP 12/28/2013  Physical Exam  Constitutional: She appears well-developed and well-nourished. No distress.  HENT:  Head: Normocephalic and atraumatic.  Mouth/Throat: Oropharynx is clear and moist. No oropharyngeal exudate.  Eyes: Conjunctivae and EOM are normal. Pupils are equal, round, and reactive to light. Right eye exhibits no discharge. Left eye exhibits no discharge. No scleral icterus.  Neck: Normal range of motion. Neck supple. No JVD present. No thyromegaly present.  Cardiovascular: Normal rate, regular rhythm, normal heart sounds and intact distal pulses.  Exam reveals no gallop and no friction rub.   No murmur heard. Pulmonary/Chest: Effort normal and  breath sounds normal. No respiratory distress. She has no wheezes. She has no rales.  Abdominal: Soft. Bowel sounds are normal. She exhibits no distension and no mass. There is tenderness ( ttp in the RLQ without guarding or other abd ttp.).  Musculoskeletal: Normal range of motion. She exhibits no edema or tenderness.  Lymphadenopathy:    She has no cervical adenopathy.  Neurological: She is alert. Coordination normal.  Skin: Skin is warm and dry. No rash noted. No erythema.  Psychiatric: She has a normal mood and affect. Her behavior is normal.  Nursing note and vitals reviewed.   ED Course  Procedures (including critical care time) Labs Review Labs Reviewed  CBC WITH DIFFERENTIAL - Abnormal; Notable for the following:    RBC 3.78 (*)    Hemoglobin 11.3 (*)    HCT 34.9 (*)    Monocytes Relative 15 (*)    Monocytes Absolute 1.5 (*)    All other components within normal limits  COMPREHENSIVE METABOLIC PANEL - Abnormal; Notable for the following:    Glucose, Bld 109 (*)    Total Bilirubin <0.2 (*)    All other components within normal limits  URINALYSIS, ROUTINE W REFLEX MICROSCOPIC  POC URINE PREG, ED    Imaging Review Ct Abdomen Pelvis W Contrast  01/04/2014   CLINICAL DATA:  RIGHT lower quadrant pain, diarrhea. History of hemolytic anemia, treated lymphoma and splenectomy.  EXAM: CT ABDOMEN AND PELVIS WITH CONTRAST  TECHNIQUE: Multidetector CT imaging of the abdomen and pelvis was performed using the standard protocol following bolus administration of intravenous contrast.  CONTRAST:  129mL OMNIPAQUE IOHEXOL 300 MG/ML  SOLN  COMPARISON:  Pelvic sonogram June 02, 2013 and CT of the abdomen and pelvis report December 27, 2009 though images are not available for direct comparison. CT of the abdomen and pelvis August 04, 2009 reviewed.  FINDINGS: LUNG BASES: Included view of the lung bases are clear. Visualized heart and pericardium are unremarkable. Mildly distended distal esophagus  with enteric contrast may reflect reflux.  SOLID ORGANS: The liver, gallbladder, pancreas and adrenal glands are unremarkable. Surgically absent spleen.  GASTROINTESTINAL TRACT: The stomach, small bowel are normal in course and caliber without inflammatory changes. Ascending colon courses posterior to the liver. The appendix is not discretely identified, however there are no inflammatory changes in the right lower quadrant.  KIDNEYS/ URINARY TRACT: Kidneys are orthotopic, demonstrating symmetric enhancement. No nephrolithiasis, hydronephrosis or solid renal masses. The unopacified ureters are normal in course and caliber. Urinary bladder is partially distended and unremarkable.  PERITONEUM/RETROPERITONEUM: No intraperitoneal free fluid nor free air. Aortoiliac vessels are normal in course and caliber. 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 Increasing pelvic wall lymphadenopathy measuring up to the 9 mm, previously 7 mm Inguinal lymphadenopathy measuring up to 14 mm short axis. External iliac lymphadenopathy, measuring 10 mm on the RIGHT.  SOFT TISSUE/OSSEOUS  STRUCTURES: Nonsuspicious.  IMPRESSION: No acute intra-abdominal nor pelvic process.  Status post splenectomy. Worsening pelvic lymphadenopathy, given patient's history of lymphoma, recurrent disease is a concern.   Electronically Signed   By: Elon Alas   On: 01/04/2014 04:28      MDM   Final diagnoses:  Abdominal pain  Abdominal pain    Labs are rather unremarkable, vital signs are normal, the patient has pain at McBurney's point, CT scan pending. She declines pain medications.  Imaging neg for acute findings, labs normal, pt given reassurange and told of LAD in the abd / pelvis - will f/u with hem onc / PMD.  Copy of CT scan resutls given to pt.  Meds given in ED:  Medications  0.9 %  sodium chloride infusion ( Intravenous New Bag/Given 01/04/14 0330)  fentaNYL  (SUBLIMAZE) injection 50 mcg (50 mcg Intravenous Given 01/04/14 0329)  iohexol (OMNIPAQUE) 300 MG/ML solution 50 mL (50 mLs Oral Contrast Given 01/04/14 0313)  iohexol (OMNIPAQUE) 300 MG/ML solution 100 mL (100 mLs Intravenous Contrast Given 01/04/14 0356)    New Prescriptions   NAPROXEN (NAPROSYN) 500 MG TABLET    Take 1 tablet (500 mg total) by mouth 2 (two) times daily with a meal.   TRAMADOL (ULTRAM) 50 MG TABLET    Take 1 tablet (50 mg total) by mouth every 6 (six) hours as needed.      Johnna Acosta, MD 01/04/14 [phone]

## 2014-01-04 NOTE — ED Notes (Signed)
Pt presents with c/o lower abdominal pain on the right side that started last night around 7:45pm. Pt reports some diarrhea but denies any N/V.

## 2014-01-04 NOTE — Discharge Instructions (Signed)
Please call your doctor for a followup appointment within 24-48 hours. When you talk to your doctor please let them know that you were seen in the emergency department and have them acquire all of your records so that they can discuss the findings with you and formulate a treatment plan to fully care for your new and ongoing problems. ° °Abdominal Pain °Many things can cause abdominal pain. Usually, abdominal pain is not caused by a disease and will improve without treatment. It can often be observed and treated at home. Your health care provider will do a physical exam and possibly order blood tests and X-rays to help determine the seriousness of your pain. However, in many cases, more time must pass before a clear cause of the pain can be found. Before that point, your health care provider may not know if you need more testing or further treatment. °HOME CARE INSTRUCTIONS  °Monitor your abdominal pain for any changes. The following actions may help to alleviate any discomfort you are experiencing: °· Only take over-the-counter or prescription medicines as directed by your health care provider. °· Do not take laxatives unless directed to do so by your health care provider. °· Try a clear liquid diet (broth, tea, or water) as directed by your health care provider. Slowly move to a bland diet as tolerated. °SEEK MEDICAL CARE IF: °· You have unexplained abdominal pain. °· You have abdominal pain associated with nausea or diarrhea. °· You have pain when you urinate or have a bowel movement. °· You experience abdominal pain that wakes you in the night. °· You have abdominal pain that is worsened or improved by eating food. °· You have abdominal pain that is worsened with eating fatty foods. °· You have a fever. °SEEK IMMEDIATE MEDICAL CARE IF:  °· Your pain does not go away within 2 hours. °· You keep throwing up (vomiting). °· Your pain is felt only in portions of the abdomen, such as the right side or the left lower  portion of the abdomen. °· You pass bloody or black tarry stools. °MAKE SURE YOU: °· Understand these instructions.   °· Will watch your condition.   °· Will get help right away if you are not doing well or get worse.   °Document Released: 11/01/2004 Document Revised: 01/27/2013 Document Reviewed: 10/01/2012 °[employer]® Patient Information ©2015 [employer]. This information is not intended to replace advice given to you by your health care provider. Make sure you discuss any questions you have with your health care provider. ° °

## 2014-01-04 NOTE — ED Notes (Signed)
Patient transported to CT 

## 2014-01-05 ENCOUNTER — Telehealth: Payer: Self-pay

## 2014-01-05 NOTE — Telephone Encounter (Signed)
lntcb to reschedule AEX with Dr. Charlies Constable

## 2014-01-28 ENCOUNTER — Telehealth: Payer: Self-pay | Admitting: Hematology

## 2014-01-28 NOTE — Telephone Encounter (Signed)
LEFT MESSAGE FOR PATIENT TO RETURN CALL TO SCHEDULE NP APPT.  °

## 2014-02-16 ENCOUNTER — Telehealth: Payer: Self-pay | Admitting: Hematology

## 2014-02-16 NOTE — Telephone Encounter (Signed)
LEFT MESSAGE FOR PATIENT TO RETURN CALL TO SCHEDULE NP APPT.  °

## 2014-02-17 ENCOUNTER — Telehealth: Payer: Self-pay | Admitting: Hematology

## 2014-02-17 NOTE — Telephone Encounter (Signed)
S/W PATIENT AND GAVE NP APPT FOR 01/29 @ 10:30 W/DR. FENG REFERRING DR. Evangeline Gula DX- INC'D ABD LYMPH NODES

## 2014-03-05 ENCOUNTER — Ambulatory Visit (HOSPITAL_BASED_OUTPATIENT_CLINIC_OR_DEPARTMENT_OTHER): Payer: 59 | Admitting: Hematology

## 2014-03-05 ENCOUNTER — Telehealth: Payer: Self-pay | Admitting: Hematology

## 2014-03-05 ENCOUNTER — Ambulatory Visit (HOSPITAL_BASED_OUTPATIENT_CLINIC_OR_DEPARTMENT_OTHER): Payer: 59

## 2014-03-05 ENCOUNTER — Ambulatory Visit: Payer: 59

## 2014-03-05 ENCOUNTER — Encounter: Payer: Self-pay | Admitting: Hematology

## 2014-03-05 VITALS — BP 130/72 | HR 99 | Temp 98.4°F | Resp 20 | Ht 66.0 in | Wt 161.6 lb

## 2014-03-05 DIAGNOSIS — R599 Enlarged lymph nodes, unspecified: Secondary | ICD-10-CM

## 2014-03-05 DIAGNOSIS — D589 Hereditary hemolytic anemia, unspecified: Secondary | ICD-10-CM

## 2014-03-05 LAB — URIC ACID (CC13): Uric Acid, Serum: 4.6 mg/dl (ref 2.6–7.4)

## 2014-03-05 LAB — CBC & DIFF AND RETIC
BASO%: 0.4 % (ref 0.0–2.0)
Basophils Absolute: 0 10*3/uL (ref 0.0–0.1)
EOS%: 0.8 % (ref 0.0–7.0)
Eosinophils Absolute: 0.1 10*3/uL (ref 0.0–0.5)
HCT: 37.3 % (ref 34.8–46.6)
HGB: 12.2 g/dL (ref 11.6–15.9)
Immature Retic Fract: 2.6 % (ref 1.60–10.00)
LYMPH%: 34.6 % (ref 14.0–49.7)
MCH: 30.1 pg (ref 25.1–34.0)
MCHC: 32.7 g/dL (ref 31.5–36.0)
MCV: 92.1 fL (ref 79.5–101.0)
MONO#: 1.6 10*3/uL — ABNORMAL HIGH (ref 0.1–0.9)
MONO%: 14.6 % — AB (ref 0.0–14.0)
NEUT#: 5.3 10*3/uL (ref 1.5–6.5)
NEUT%: 49.6 % (ref 38.4–76.8)
Platelets: 339 10*3/uL (ref 145–400)
RBC: 4.05 10*6/uL (ref 3.70–5.45)
RDW: 14.7 % — ABNORMAL HIGH (ref 11.2–14.5)
Retic %: 0.95 % (ref 0.70–2.10)
Retic Ct Abs: 38.48 10*3/uL (ref 33.70–90.70)
WBC: 10.6 10*3/uL — ABNORMAL HIGH (ref 3.9–10.3)
lymph#: 3.7 10*3/uL — ABNORMAL HIGH (ref 0.9–3.3)

## 2014-03-05 LAB — COMPREHENSIVE METABOLIC PANEL (CC13)
ALBUMIN: 4.2 g/dL (ref 3.5–5.0)
ALK PHOS: 49 U/L (ref 40–150)
ALT: 22 U/L (ref 0–55)
AST: 22 U/L (ref 5–34)
Anion Gap: 9 mEq/L (ref 3–11)
BUN: 12.7 mg/dL (ref 7.0–26.0)
CO2: 21 meq/L — AB (ref 22–29)
CREATININE: 0.7 mg/dL (ref 0.6–1.1)
Calcium: 9 mg/dL (ref 8.4–10.4)
Chloride: 109 mEq/L (ref 98–109)
EGFR: >90 mL/min/{1.73_m2} (ref >90)
GLUCOSE: 97 mg/dL (ref 70–140)
Potassium: 4.5 mEq/L (ref 3.5–5.1)
Sodium: 138 mEq/L (ref 136–145)
Total Bilirubin: 0.29 mg/dL (ref 0.20–1.20)
Total Protein: 7.9 g/dL (ref 6.4–8.3)

## 2014-03-05 LAB — HOLD TUBE, BLOOD BANK

## 2014-03-05 LAB — LACTATE DEHYDROGENASE (CC13): LDH: 216 U/L (ref 125–245)

## 2014-03-05 NOTE — Progress Notes (Signed)
Checked in new pt with no financial concerns prior to seeing the dr.  Pt has my card for any billing or insurance questions or concerns. ° °

## 2014-03-05 NOTE — Progress Notes (Signed)
[facility]  Telephone:([phone] Fax:(336) [phone]  Clinic New Consult Note   Patient Care Team: No Pcp Per Patient as PCP - General (General Practice) 03/05/2014  CHIEF COMPLAINTS/PURPOSE OF CONSULTATION:  Adenopathy   HISTORY OF PRESENTING ILLNESS:  Casey Chapman 29 y.o. female is here because of her recent abnormal CT scan, which showed some adenopathy.  She was seen by Dr. 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 Her anemia resolved afterwards and she has not required any treatment since then.   She had some right low abdomen pain on 01/04/2014 and she was seen at ED. CT abdomen and pelvis with IV contrast was obtained, which showed adenopathy E inguinal, external iliac, measuring 9-14 mm. She did not have fever, diarrhea, dysuria or other symptoms. She had multiple sexual partners in the past, had chlamydia in the past, but denies any other sexually transmitted disease.  She feels well overall, denies any pain, dyspnea, abdominal discomfort, nausea or change of her bowel habits. She has no fever, night sweats or weight loss recently.   MEDICAL HISTORY:  Past Medical History  Diagnosis Date  . 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 Chlamydia 04/16/12    Treated   . Eczema     SURGICAL HISTORY: Past Surgical History  Procedure Laterality Date  . Splenectomy  2012   . Tonsillectomy  1990    SOCIAL HISTORY: History   Social History  . Marital Status: Single    Spouse Name: N/A    Number of Children: N/A  . Years of Education: N/A   Occupational History  . Not on file.   Social History Main Topics  . Smoking status: Never Smoker   . Smokeless tobacco: Not on file  . Alcohol Use: Yes     Comment: Socially  . Drug Use: No  . Sexual Activity:    Partners: Male   Other Topics Concern  . Not on file   Social History Narrative    FAMILY  HISTORY: Family History  Problem Relation Age of Onset  . Diabetes Mother   . Diabetes Brother   . Diabetes Maternal Grandmother   . Diabetes Paternal Grandmother   . Hypertension Mother   . Hypertension Paternal Grandmother   . Thyroid disease Father     Thyroid removed  . Depression Mother   NO family hsitory of blood disorder or malignancy   ALLERGIES:  has No Known Allergies.  MEDICATIONS:  Current Outpatient Prescriptions  Medication Sig Dispense Refill  . acetaminophen (TYLENOL) 500 MG tablet Take 500 mg by mouth every 6 (six) hours as needed. pain    . clindamycin (CLEOCIN) 150 MG capsule Take 3 capsules (450 mg total) by mouth 3 (three) times daily. (Patient not taking: Reported on 01/04/2014) 90 capsule 0  . fluconazole (DIFLUCAN) 150 MG tablet Take 1 tablet (150 mg total) by mouth once. Take one tablet.  Repeat in 48 hours if symptoms are not completely resolved. (Patient not taking: Reported on 01/04/2014) 2 tablet 0  . hydrocortisone cream 0.5 % Apply 1 application topically daily as needed. Apply to arms and legs as needed    . Multiple Vitamins-Minerals (MULTIVITAMIN PO) Take 1 tablet by mouth daily.     . naproxen (NAPROSYN) 500 MG tablet Take 1 tablet (500 mg total) by mouth 2 (two) times daily with a meal. 30 tablet 0  .  traMADol (ULTRAM) 50 MG tablet Take 1 tablet (50 mg total) by mouth every 6 (six) hours as needed. 15 tablet 0   No current facility-administered medications for this visit.    REVIEW OF SYSTEMS:   Constitutional: Denies fevers, chills or abnormal night sweats Eyes: Denies blurriness of vision, double vision or watery eyes Ears, nose, mouth, throat, and face: Denies mucositis or sore throat Respiratory: Denies cough, dyspnea or wheezes Cardiovascular: Denies palpitation, chest discomfort or lower extremity swelling Gastrointestinal:  Denies nausea, heartburn or change in bowel habits Skin: Denies abnormal skin rashes Lymphatics: Denies new  lymphadenopathy or easy bruising Neurological:Denies numbness, tingling or new weaknesses Behavioral/Psych: Mood is stable, no new changes  All other systems were reviewed with the patient and are negative.  PHYSICAL EXAMINATION: ECOG PERFORMANCE STATUS: 0 - Asymptomatic  Filed Vitals:   03/05/14 1147  BP: 130/72  Pulse: 99  Temp: 98.4 F (36.9 C)  Resp: 20   Filed Weights   03/05/14 1147  Weight: 161 lb 9.6 oz (73.301 kg)    GENERAL:alert, no distress and comfortable SKIN: skin color, texture, turgor are normal, no rashes or significant lesions EYES: normal, conjunctiva are pink and non-injected, sclera clear OROPHARYNX:no exudate, no erythema and lips, buccal mucosa, and tongue normal  NECK: supple, thyroid normal size, non-tender, without nodularity LYMPH:  no palpable lymphadenopathy in the cervical, axillary, there is 2-3 enlarged lymph nodes in the right inguinal, measuring about 2 cm. No palpable lymph nodes in the left inguinal. LUNGS: clear to auscultation and percussion with normal breathing effort HEART: regular rate & rhythm and no murmurs and no lower extremity edema ABDOMEN:abdomen soft, non-tender and normal bowel sounds Musculoskeletal:no cyanosis of digits and no clubbing  PSYCH: alert & oriented x 3 with fluent speech NEURO: no focal motor/sensory deficits  LABORATORY DATA:  I have reviewed the data as listed Lab Results  Component Value Date   WBC 10.2 01/04/2014   HGB 11.3* 01/04/2014   HCT 34.9* 01/04/2014   MCV 92.3 01/04/2014   PLT 344 01/04/2014    Recent Labs  01/04/14 0055  NA 141  K 4.5  CL 103  CO2 25  GLUCOSE 109*  BUN 10  CREATININE 0.81  CALCIUM 9.7  GFRNONAA >90  GFRAA >90  PROT 7.8  ALBUMIN 3.9  AST 23  ALT 18  ALKPHOS 48  BILITOT <0.2*    RADIOGRAPHIC STUDIES: I have personally reviewed the radiological images as listed and agreed with the findings in the report.  CT abdomen and pelvis with IV contrast on  02/03/2014 PERITONEUM/RETROPERITONEUM: No intraperitoneal free fluid nor free air. Aortoiliac vessels are normal in course and caliber. 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 Increasing pelvic wall lymphadenopathy measuring up to the 9 mm, previously 7 mm Inguinal lymphadenopathy measuring up to 14 mm short axis. External iliac lymphadenopathy, measuring 10 mm on the RIGHT.  SOFT TISSUE/OSSEOUS STRUCTURES: Nonsuspicious.  IMPRESSION: No acute intra-abdominal nor pelvic process.  Status post splenectomy. Worsening pelvic lymphadenopathy.  ASSESSMENT & PLAN:  [place] African-American female, history of hemolytic anemia status post splenectomy. She was found to have a multiple enlarged lymph nodes in the abdomen and inguinal area on the CT scan in November 2015  1. Adenopathy -She has several enlarged lymph nodes in the right axilla external iliac and right inguinal area, the later was palpable on exam today. -She is clinically asymptomatic, no any B symptoms. Also lymphoma is on differential, I think this  is unlikely given the localized adenopathy and no clinical symptoms. The lymphadenopathy is more likely reactive. -I'll check her CBC, CMP, LDH today -I'll repeat CT scan, including CT of chest in 1 month's this follow-up this lymphadenopathy. If it gets worse, I would consider surgical biopsy of her right inguinal lymph nodes  2. History of hemolytic anemia -Resolved. Status post splenectomy.  3. Cancer screening I encouraged her to follow-up with her GYN and have Pap smear annually.  Follow-up: Return in 1 months with a CT of chest, abdomen and pelvis with IV contrast.  All questions were answered. The patient knows to call the clinic with any problems, questions or concerns. I spent 35 minutes counseling the patient face to face. The total time spent in the appointment was 45 minutes and more than 50%  was on counseling.     Truitt Merle, MD 03/05/2014 12:02 PM

## 2014-03-05 NOTE — Telephone Encounter (Signed)
Gave avs & calendar for February. Also gave contrast for Ct Scan.

## 2014-03-06 ENCOUNTER — Encounter: Payer: Self-pay | Admitting: Hematology

## 2014-03-06 LAB — HAPTOGLOBIN: HAPTOGLOBIN: 81 mg/dL (ref 43–212)

## 2014-03-25 ENCOUNTER — Telehealth: Payer: Self-pay | Admitting: *Deleted

## 2014-03-25 NOTE — Telephone Encounter (Signed)
Called & left message for pt to cancel lab appt for tomorrow but to go to CT scan appt.

## 2014-03-26 ENCOUNTER — Ambulatory Visit (HOSPITAL_COMMUNITY)
Admission: RE | Admit: 2014-03-26 | Discharge: 2014-03-26 | Disposition: A | Discharge status: Home / Self Care | Payer: 59 | Source: Ambulatory Visit | Attending: Hematology | Admitting: Hematology

## 2014-03-26 ENCOUNTER — Other Ambulatory Visit: Payer: 59

## 2014-03-26 DIAGNOSIS — D589 Hereditary hemolytic anemia, unspecified: Secondary | ICD-10-CM | POA: Insufficient documentation

## 2014-03-26 DIAGNOSIS — R599 Enlarged lymph nodes, unspecified: Secondary | ICD-10-CM | POA: Diagnosis not present

## 2014-03-26 MED ORDER — IOHEXOL 300 MG/ML  SOLN
100.0000 mL | Freq: Once | INTRAMUSCULAR | Status: AC | PRN
  Administered 2014-03-26: 100 mL via INTRAVENOUS

## 2014-04-02 ENCOUNTER — Encounter: Payer: Self-pay | Admitting: *Deleted

## 2014-04-02 ENCOUNTER — Encounter: Payer: Self-pay | Admitting: Hematology

## 2014-04-02 ENCOUNTER — Ambulatory Visit (HOSPITAL_BASED_OUTPATIENT_CLINIC_OR_DEPARTMENT_OTHER): Payer: 59 | Admitting: Hematology

## 2014-04-02 ENCOUNTER — Telehealth: Payer: Self-pay | Admitting: Hematology

## 2014-04-02 VITALS — BP 111/52 | HR 96 | Temp 98.6°F | Resp 18 | Ht 66.0 in | Wt 161.8 lb

## 2014-04-02 DIAGNOSIS — F418 Other specified anxiety disorders: Secondary | ICD-10-CM

## 2014-04-02 DIAGNOSIS — R599 Enlarged lymph nodes, unspecified: Secondary | ICD-10-CM

## 2014-04-02 DIAGNOSIS — R591 Generalized enlarged lymph nodes: Secondary | ICD-10-CM

## 2014-04-02 NOTE — Progress Notes (Signed)
[facility]  Telephone:(336) [phone] Fax:(336) [facility] Note   Patient Care Team: Merrilee Seashore, MD as PCP - General (Internal Medicine) 04/02/2014  CHIEF COMPLAINTS:  Follow up Adenopathy   HISTORY OF PRESENTING ILLNESS:  Casey Chapman 29 y.o. female is here because of her recent abnormal CT scan, which showed some adenopathy.  She was seen by Dr. 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 Her anemia resolved afterwards and she has not required any treatment since then.   She had some right low abdomen pain on 01/04/2014 and she was seen at ED. CT abdomen and pelvis with IV contrast was obtained, which showed adenopathy E inguinal, external iliac, measuring 9-14 mm. She did not have fever, diarrhea, dysuria or other symptoms. She had multiple sexual partners in the past, had chlamydia in the past, but denies any other sexually transmitted disease.  She feels well overall, denies any pain, dyspnea, abdominal discomfort, nausea or change of her bowel habits. She has no fever, night sweats or weight loss recently.   INTERIM HISTORY: She returns for follow-up. She feels well, no complaints. She denies any pain, fever, chill, or weight loss. She is active, no recent infection.  MEDICAL HISTORY:  Past Medical History  Diagnosis Date  . 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 Chlamydia 04/16/12    Treated   . Eczema     SURGICAL HISTORY: Past Surgical History  Procedure Laterality Date  . Splenectomy  2012   . Tonsillectomy  1990    SOCIAL HISTORY: History   Social History  . Marital Status: Single    Spouse Name: N/A  . Number of Children: N/A  . Years of Education: N/A   Occupational History  . Not on file.   Social History Main Topics  . Smoking status: Never Smoker   . Smokeless tobacco: Not on file  . Alcohol Use: Yes     Comment:  Socially  . Drug Use: No  . Sexual Activity:    Partners: Male   Other Topics Concern  . Not on file   Social History Narrative    FAMILY HISTORY: Family History  Problem Relation Age of Onset  . Diabetes Mother   . Diabetes Brother   . Diabetes Maternal Grandmother   . Diabetes Paternal Grandmother   . Hypertension Mother   . Hypertension Paternal Grandmother   . Thyroid disease Father     Thyroid removed  . Depression Mother   NO family hsitory of blood disorder or malignancy   ALLERGIES:  has No Known Allergies.  MEDICATIONS:  Current Outpatient Prescriptions  Medication Sig Dispense Refill  . acetaminophen (TYLENOL) 500 MG tablet Take 500 mg by mouth every 6 (six) hours as needed. pain    . hydrocortisone cream 0.5 % Apply 1 application topically daily as needed. Apply to arms and legs as needed    . Multiple Vitamins-Minerals (MULTIVITAMIN PO) Take 1 tablet by mouth daily.      No current facility-administered medications for this visit.    REVIEW OF SYSTEMS:   Constitutional: Denies fevers, chills or abnormal night sweats Eyes: Denies blurriness of vision, double vision or watery eyes Ears, nose, mouth, throat, and face: Denies mucositis or sore throat Respiratory: Denies cough, dyspnea or wheezes Cardiovascular: Denies palpitation, chest discomfort or lower extremity swelling Gastrointestinal:  Denies nausea, heartburn or change in  bowel habits Skin: Denies abnormal skin rashes Lymphatics: Denies new lymphadenopathy or easy bruising Neurological:Denies numbness, tingling or new weaknesses Behavioral/Psych: Mood is stable, no new changes  All other systems were reviewed with the patient and are negative.  PHYSICAL EXAMINATION: ECOG PERFORMANCE STATUS: 0 - Asymptomatic  Filed Vitals:   04/02/14 0918  BP: 111/52  Pulse: 96  Temp: 98.6 F (37 C)  Resp: 18   Filed Weights   04/02/14 0918  Weight: 161 lb 12.8 oz (73.392 kg)    GENERAL:alert, no  distress and comfortable SKIN: skin color, texture, turgor are normal, no rashes or significant lesions EYES: normal, conjunctiva are pink and non-injected, sclera clear OROPHARYNX:no exudate, no erythema and lips, buccal mucosa, and tongue normal  NECK: supple, thyroid normal size, non-tender, without nodularity LYMPH:  no palpable lymphadenopathy in the cervical, supraclavicular, however there are 2cm round, movable node on each side of axilla, 2-3 enlarged lymph nodes in the right inguinal, measuring about 2 cm. No palpable lymph nodes in the left inguinal. LUNGS: clear to auscultation and percussion with normal breathing effort HEART: regular rate & rhythm and no murmurs and no lower extremity edema ABDOMEN:abdomen soft, non-tender and normal bowel sounds Musculoskeletal:no cyanosis of digits and no clubbing  PSYCH: alert & oriented x 3 with fluent speech NEURO: no focal motor/sensory deficits  LABORATORY DATA:  I have reviewed the data as listed Lab Results  Component Value Date   WBC 10.6* 03/05/2014   HGB 12.2 03/05/2014   HCT 37.3 03/05/2014   MCV 92.1 03/05/2014   PLT 339 03/05/2014    Recent Labs  01/04/14 0055 03/05/14 1224  NA 141 138  K 4.5 4.5  CL 103  --   CO2 25 21*  GLUCOSE 109* 97  BUN 10 12.7  CREATININE 0.81 0.7  CALCIUM 9.7 9.0  GFRNONAA >90  --   GFRAA >90  --   PROT 7.8 7.9  ALBUMIN 3.9 4.2  AST 23 22  ALT 18 22  ALKPHOS 48 49  BILITOT <0.2* 0.29    RADIOGRAPHIC STUDIES: I have personally reviewed the radiological images as listed and agreed with the findings in the report.  CT abdomen and pelvis with IV contrast on 02/03/2014 PERITONEUM/RETROPERITONEUM: No intraperitoneal free fluid nor free air. Aortoiliac vessels are normal in course and caliber. 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 Increasing pelvic wall lymphadenopathy measuring up to the 9 mm, previously 7 mm  Inguinal lymphadenopathy measuring up to 14 mm short axis. External iliac lymphadenopathy, measuring 10 mm on the RIGHT.  SOFT TISSUE/OSSEOUS STRUCTURES: Nonsuspicious.  IMPRESSION: No acute intra-abdominal nor pelvic process.  Status post splenectomy. Worsening pelvic lymphadenopathy.  CT chest, abdomen and pelvis on 03/26/2014 IMPRESSION: 1. Axillary, supraclavicular and subpectoral adenopathy progressive since prior chest CT from 2011. 2. Stable pelvic and inguinal adenopathy since abdominal CT scan 01/04/2014. 3. No mesenteric or retroperitoneal adenopathy. 4. Status post splenectomy with enlarging left upper quadrant splenule wounds. 5. Persistent esophageal dilatation. Possible achalasia. 6. Mild/early emphysematous changes.   ASSESSMENT & PLAN:  Casey Chapman African-American female, history of hemolytic anemia status post splenectomy. She was found to have multiple enlarged lymph nodes in the abdomen and inguinal area on the CT scan in November 2015, CT chest showed multiple axillary and supraclavicular nodes, larger than 2011.  1. Adenopathy -I explained her restaging CT scan findings to her. Her pelvic and inguinal adenopathy are stable, however the axillary and mediastinal adenopathy has significantly grown  since 2011. -She has several diffuse enlarged lymph nodes and some are palpable on exam today. -I would like to obtain a core biopsy or surgical biopsy of the axillary lymph node to ruled out lymphoma. She is clinically asymptomatic, no any B symptoms, normal LDH and organ function, this is unlikely aggressive lymphoma the other possibility is this is reactive, although she does not have active infection or suspicious for rheumatoid disease. -I'll discuss with our interventional radiology first to see if core biopsy is feasible. If not, I'll refer her to surgery for biopsy.  2. History of hemolytic anemia -Resolved. Status post splenectomy.  3. Cancer  screening I encouraged her to follow-up with her GYN and have Pap smear annually.  Follow-up:  I'll see her back after her biopsy.   All questions were answered. The patient knows to call the clinic with any problems, questions or concerns. I spent 35 minutes counseling the patient face to face. The total time spent in the appointment was 45 minutes and more than 50% was on counseling.     Truitt Merle, MD 04/02/2014 9:35 AM

## 2014-04-02 NOTE — Telephone Encounter (Signed)
Gave avs & calendar for March. °

## 2014-04-09 ENCOUNTER — Telehealth: Payer: Self-pay | Admitting: *Deleted

## 2014-04-09 NOTE — Telephone Encounter (Signed)
Called [facility] and spoke with [place].  Informed [facility] that Dr. Burr Medico had spoken with Dr. Claudie Revering about doing US Biopsy of axillary on pt.  Arena wanted to know pt's last mammogram.  Asked [facility] to contact pt to find out about mammogram, and to schedule pt for biopsy as ordered by Dr. Burr Medico.  Faxed last office notes to [facility].   [facility]   Phone    [phone]   ;    Fax       [phone].

## 2014-04-13 ENCOUNTER — Other Ambulatory Visit: Payer: Self-pay | Admitting: Hematology

## 2014-04-13 DIAGNOSIS — R2232 Localized swelling, mass and lump, left upper limb: Secondary | ICD-10-CM

## 2014-04-15 ENCOUNTER — Other Ambulatory Visit: Payer: Self-pay | Admitting: Hematology

## 2014-04-15 DIAGNOSIS — R599 Enlarged lymph nodes, unspecified: Secondary | ICD-10-CM

## 2014-04-15 DIAGNOSIS — R591 Generalized enlarged lymph nodes: Secondary | ICD-10-CM

## 2014-04-16 ENCOUNTER — Encounter: Payer: Self-pay | Admitting: *Deleted

## 2014-04-16 NOTE — Progress Notes (Signed)
[facility] Psychosocial Distress Screening Clinical Social Work  Clinical Social Work was referred by distress screening protocol.  The patient scored a 5 on the Psychosocial Distress Thermometer which indicates moderate distress. Clinical Social Worker phoned pt to assess for distress and other psychosocial needs. Pt not available, CSW left vm.   ONCBCN DISTRESS SCREENING 04/02/2014  Distress experienced in past week (1-10) 5  Practical problem type Work/school  Emotional problem type Nervousness/Anxiety  Physical Problem type Sleep/insomnia;Skin dry/itchy  Referral to clinical social work Yes     Clinical Social Worker follow up needed: No.  If yes, follow up plan:  Loren Racer, [place]  [facility] Phone: [phone] Fax: [phone]

## 2014-04-23 ENCOUNTER — Inpatient Hospital Stay: Admission: RE | Admit: 2014-04-23 | Payer: 59 | Source: Ambulatory Visit

## 2014-04-23 ENCOUNTER — Ambulatory Visit: Payer: 59 | Admitting: Hematology

## 2014-04-23 ENCOUNTER — Other Ambulatory Visit: Payer: 59

## 2014-05-03 ENCOUNTER — Ambulatory Visit
Admission: RE | Admit: 2014-05-03 | Discharge: 2014-05-03 | Disposition: A | Discharge status: Home / Self Care | Payer: 59 | Source: Ambulatory Visit | Attending: Hematology | Admitting: Hematology

## 2014-05-03 DIAGNOSIS — R2232 Localized swelling, mass and lump, left upper limb: Secondary | ICD-10-CM

## 2014-05-03 DIAGNOSIS — R599 Enlarged lymph nodes, unspecified: Secondary | ICD-10-CM

## 2014-05-03 DIAGNOSIS — R591 Generalized enlarged lymph nodes: Secondary | ICD-10-CM

## 2014-05-10 ENCOUNTER — Ambulatory Visit: Payer: 59 | Admitting: Gynecology

## 2014-05-14 ENCOUNTER — Other Ambulatory Visit (HOSPITAL_BASED_OUTPATIENT_CLINIC_OR_DEPARTMENT_OTHER): Payer: 59

## 2014-05-14 ENCOUNTER — Telehealth: Payer: Self-pay | Admitting: Hematology

## 2014-05-14 ENCOUNTER — Ambulatory Visit (HOSPITAL_BASED_OUTPATIENT_CLINIC_OR_DEPARTMENT_OTHER): Payer: 59 | Admitting: Hematology

## 2014-05-14 VITALS — BP 112/77 | HR 103 | Temp 99.3°F | Resp 18 | Ht 66.0 in | Wt 160.5 lb

## 2014-05-14 DIAGNOSIS — R590 Localized enlarged lymph nodes: Secondary | ICD-10-CM

## 2014-05-14 DIAGNOSIS — R591 Generalized enlarged lymph nodes: Secondary | ICD-10-CM

## 2014-05-14 DIAGNOSIS — R599 Enlarged lymph nodes, unspecified: Secondary | ICD-10-CM

## 2014-05-14 LAB — CBC WITH DIFFERENTIAL/PLATELET
BASO%: 0.9 % (ref 0.0–2.0)
Basophils Absolute: 0.1 10*3/uL (ref 0.0–0.1)
EOS%: 0.9 % (ref 0.0–7.0)
Eosinophils Absolute: 0.1 10*3/uL (ref 0.0–0.5)
HCT: 37.3 % (ref 34.8–46.6)
HEMOGLOBIN: 12 g/dL (ref 11.6–15.9)
LYMPH%: 30.8 % (ref 14.0–49.7)
MCH: 29.7 pg (ref 25.1–34.0)
MCHC: 32.1 g/dL (ref 31.5–36.0)
MCV: 92.4 fL (ref 79.5–101.0)
MONO#: 1.4 10*3/uL — ABNORMAL HIGH (ref 0.1–0.9)
MONO%: 12.6 % (ref 0.0–14.0)
NEUT#: 6.1 10*3/uL (ref 1.5–6.5)
NEUT%: 54.8 % (ref 38.4–76.8)
PLATELETS: 304 10*3/uL (ref 145–400)
RBC: 4.04 10*6/uL (ref 3.70–5.45)
RDW: 15.1 % — ABNORMAL HIGH (ref 11.2–14.5)
WBC: 11.2 10*3/uL — ABNORMAL HIGH (ref 3.9–10.3)
lymph#: 3.4 10*3/uL — ABNORMAL HIGH (ref 0.9–3.3)

## 2014-05-14 LAB — COMPREHENSIVE METABOLIC PANEL (CC13)
ALBUMIN: 4 g/dL (ref 3.5–5.0)
ALT: 14 U/L (ref 0–55)
AST: 19 U/L (ref 5–34)
Alkaline Phosphatase: 46 U/L (ref 40–150)
Anion Gap: 13 mEq/L — ABNORMAL HIGH (ref 3–11)
BUN: 11.2 mg/dL (ref 7.0–26.0)
CHLORIDE: 107 meq/L (ref 98–109)
CO2: 22 mEq/L (ref 22–29)
Calcium: 9.1 mg/dL (ref 8.4–10.4)
Creatinine: 0.7 mg/dL (ref 0.6–1.1)
EGFR: >90 mL/min/{1.73_m2} (ref >90)
Glucose: 112 mg/dl (ref 70–140)
POTASSIUM: 4.2 meq/L (ref 3.5–5.1)
SODIUM: 141 meq/L (ref 136–145)
TOTAL PROTEIN: 7.7 g/dL (ref 6.4–8.3)
Total Bilirubin: 0.29 mg/dL (ref 0.20–1.20)

## 2014-05-14 LAB — LACTATE DEHYDROGENASE (CC13): LDH: 241 U/L (ref 125–245)

## 2014-05-14 NOTE — Telephone Encounter (Signed)
Gave avs & calendar for October. No POF sent to Onc treatment details.

## 2014-05-14 NOTE — Progress Notes (Signed)
[place]  Telephone:(336) [phone] Fax:(336) [phone]  Clinic New Consult Note   Patient Care Team: Merrilee Seashore, MD as PCP - General (Internal Medicine) 05/15/2014  CHIEF COMPLAINTS:  Follow up lymphoadenopathy   HISTORY OF PRESENTING ILLNESS:  Casey Chapman 29 y.o. female is here because of her recent abnormal CT scan, which showed some adenopathy.  She was seen by Dr. 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 Her anemia resolved afterwards and she has not required any treatment since then.   She had some right low abdomen pain on 01/04/2014 and she was seen at ED. CT abdomen and pelvis with IV contrast was obtained, which showed adenopathy E inguinal, external iliac, measuring 9-14 mm. She did not have fever, diarrhea, dysuria or other symptoms. She had multiple sexual partners in the past, had chlamydia in the past, but denies any other sexually transmitted disease.  She feels well overall, denies any pain, dyspnea, abdominal discomfort, nausea or change of her bowel habits. She has no fever, night sweats or weight loss recently.   INTERIM HISTORY: She returns for follow-up. She underwent a left axillary lymph node biopsy a few weeks ago and tolerated well. She feels well and denies any new complaints. She has good energy level and appetite, remains to be active. She denies any fever or chills, night sweats or weight loss.  MEDICAL HISTORY:  Past Medical History  Diagnosis Date  . 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 Chlamydia 04/16/12    Treated   . Eczema     SURGICAL HISTORY: Past Surgical History  Procedure Laterality Date  . Splenectomy  2012   . Tonsillectomy  1990    SOCIAL HISTORY: History   Social History  . Marital Status: Single    Spouse Name: N/A  . Number of Children: N/A  . Years of Education: N/A   Occupational History  . Not on file.     Social History Main Topics  . Smoking status: Never Smoker   . Smokeless tobacco: Not on file  . Alcohol Use: Yes     Comment: Socially  . Drug Use: No  . Sexual Activity:    Partners: Male   Other Topics Concern  . Not on file   Social History Narrative    FAMILY HISTORY: Family History  Problem Relation Age of Onset  . Diabetes Mother   . Diabetes Brother   . Diabetes Maternal Grandmother   . Diabetes Paternal Grandmother   . Hypertension Mother   . Hypertension Paternal Grandmother   . Thyroid disease Father     Thyroid removed  . Depression Mother   NO family hsitory of blood disorder or malignancy   ALLERGIES:  has No Known Allergies.  MEDICATIONS:  Current Outpatient Prescriptions  Medication Sig Dispense Refill  . acetaminophen (TYLENOL) 500 MG tablet Take 500 mg by mouth every 6 (six) hours as needed. pain    . hydrocortisone cream 0.5 % Apply 1 application topically daily as needed. Apply to arms and legs as needed    . Multiple Vitamins-Minerals (MULTIVITAMIN PO) Take 1 tablet by mouth daily.      No current facility-administered medications for this visit.    REVIEW OF SYSTEMS:   Constitutional: Denies fevers, chills or abnormal night sweats Eyes: Denies blurriness of vision, double vision or watery eyes Ears, nose, mouth, throat, and face: Denies mucositis  or sore throat Respiratory: Denies cough, dyspnea or wheezes Cardiovascular: Denies palpitation, chest discomfort or lower extremity swelling Gastrointestinal:  Denies nausea, heartburn or change in bowel habits Skin: Denies abnormal skin rashes Lymphatics: Denies new lymphadenopathy or easy bruising Neurological:Denies numbness, tingling or new weaknesses Behavioral/Psych: Mood is stable, no new changes  All other systems were reviewed with the patient and are negative.  PHYSICAL EXAMINATION: ECOG PERFORMANCE STATUS: 0 - Asymptomatic  Filed Vitals:   05/14/14 1154  BP: 112/77  Pulse: 103   Temp: 99.3 F (37.4 C)  Resp: 18   Filed Weights   05/14/14 1154  Weight: 160 lb 8 oz (72.802 kg)    GENERAL:alert, no distress and comfortable SKIN: skin color, texture, turgor are normal, no rashes or significant lesions EYES: normal, conjunctiva are pink and non-injected, sclera clear OROPHARYNX:no exudate, no erythema and lips, buccal mucosa, and tongue normal  NECK: supple, thyroid normal size, non-tender, without nodularity LYMPH:  no palpable lymphadenopathy in the cervical, supraclavicular, however there are 2cm round, movable node on each side of axilla, 2-3 enlarged lymph nodes in the right inguinal, measuring about 2 cm. No palpable lymph nodes in the left inguinal. LUNGS: clear to auscultation and percussion with normal breathing effort HEART: regular rate & rhythm and no murmurs and no lower extremity edema ABDOMEN:abdomen soft, non-tender and normal bowel sounds Musculoskeletal:no cyanosis of digits and no clubbing  PSYCH: alert & oriented x 3 with fluent speech NEURO: no focal motor/sensory deficits  LABORATORY DATA:  I have reviewed the data as listed Lab Results  Component Value Date   WBC 11.2* 05/14/2014   HGB 12.0 05/14/2014   HCT 37.3 05/14/2014   MCV 92.4 05/14/2014   PLT 304 05/14/2014    Recent Labs  01/04/14 0055 03/05/14 1224 05/14/14 1135  NA 141 138 141  K 4.5 4.5 4.2  CL 103  --   --   CO2 25 21* 22  GLUCOSE 109* 97 112  BUN 10 12.7 11.2  CREATININE 0.81 0.7 0.7  CALCIUM 9.7 9.0 9.1  GFRNONAA >90  --   --   GFRAA >90  --   --   PROT 7.8 7.9 7.7  ALBUMIN 3.9 4.2 4.0  AST 23 22 19   ALT 18 22 14   ALKPHOS 48 49 46  BILITOT <0.2* 0.29 0.29   PATHOLOGY REPORT: Diagnosis 05/03/2014 Breast, left, needle core biopsy, left axillary lymph node - BENIGN LYMPH NODE WITH DERMATOPATHIC CHANGE. - NO MALIGNANCY IDENTIFIED. Microscopic Comment Flow cytometry ([id number]) is negative for a monoclonal B-cell or phenotypically aberrant T-cell  population. Preliminary results were called to [facility] on 05/04/2014. Interpretation Tissue-Flow Cytometry - NO MONOCLONAL B-CELL OR PHENOTYPICALLY ABERRANT T-CELL POPULATION.  RADIOGRAPHIC STUDIES: I have personally reviewed the radiological images as listed and agreed with the findings in the report.  CT abdomen and pelvis with IV contrast on 02/03/2014 PERITONEUM/RETROPERITONEUM: No intraperitoneal free fluid nor free air. Aortoiliac vessels are normal in course and caliber. 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 Increasing pelvic wall lymphadenopathy measuring up to the 9 mm, previously 7 mm Inguinal lymphadenopathy measuring up to 14 mm short axis. External iliac lymphadenopathy, measuring 10 mm on the RIGHT.  SOFT TISSUE/OSSEOUS STRUCTURES: Nonsuspicious.  IMPRESSION: No acute intra-abdominal nor pelvic process.  Status post splenectomy. Worsening pelvic lymphadenopathy.  CT chest, abdomen and pelvis on 03/26/2014 IMPRESSION: 1. Axillary, supraclavicular and subpectoral adenopathy progressive since prior chest CT from 2011. 2.  Stable pelvic and inguinal adenopathy since abdominal CT scan 01/04/2014. 3. No mesenteric or retroperitoneal adenopathy. 4. Status post splenectomy with enlarging left upper quadrant splenule wounds. 5. Persistent esophageal dilatation. Possible achalasia. 6. Mild/early emphysematous changes.   ASSESSMENT & PLAN:  Casey Chapman African-American female, history of hemolytic anemia status post splenectomy. She was found to have multiple enlarged lymph nodes in the abdomen and inguinal area on the CT scan in November 2015, CT chest showed multiple axillary and supraclavicular nodes, larger than 2011.  1. Adenopathy -I explained her restaging CT scan findings to her. Her pelvic and inguinal adenopathy are stable, however the axillary and mediastinal  adenopathy has significantly grown since 2011. -She has several diffuse enlarged lymph nodes and some are palpable on exam. -I reviewed her left axillary lymph node biopsy results with her, which was negative for malignant cells. -Her lymphadenopathy is likely reactive, giving the negative biopsy and without any clinical symptoms.  -We'll repeat another CT scan in 6 months.   2. History of hemolytic anemia -Resolved. Status post splenectomy.  3. Cancer screening I encouraged her to follow-up with her GYN and have Pap smear annually.  Follow-up:  6 months with a CT scan.  All questions were answered. The patient knows to call the clinic with any problems, questions or concerns. I spent 25 minutes counseling the patient face to face. The total time spent in the appointment was 30 minutes and more than 50% was on counseling.     Truitt Merle, MD 05/14/2014 2:17 PM

## 2014-05-15 ENCOUNTER — Encounter: Payer: Self-pay | Admitting: Hematology

## 2014-05-15 DIAGNOSIS — R591 Generalized enlarged lymph nodes: Secondary | ICD-10-CM | POA: Insufficient documentation

## 2014-11-10 ENCOUNTER — Telehealth: Payer: Self-pay | Admitting: Hematology

## 2014-11-10 NOTE — Telephone Encounter (Signed)
pt cld left voicemail to get time of appt for 10/7-cld pt back and left voicemail of sch time & date for 10/7 & 10/14

## 2014-11-11 ENCOUNTER — Other Ambulatory Visit: Payer: Self-pay | Admitting: *Deleted

## 2014-11-11 DIAGNOSIS — R591 Generalized enlarged lymph nodes: Secondary | ICD-10-CM

## 2014-11-12 ENCOUNTER — Other Ambulatory Visit (HOSPITAL_BASED_OUTPATIENT_CLINIC_OR_DEPARTMENT_OTHER): Payer: 59

## 2014-11-12 ENCOUNTER — Encounter (HOSPITAL_COMMUNITY): Payer: Self-pay

## 2014-11-12 ENCOUNTER — Ambulatory Visit (HOSPITAL_COMMUNITY)
Admission: RE | Admit: 2014-11-12 | Discharge: 2014-11-12 | Disposition: A | Discharge status: Home / Self Care | Payer: 59 | Source: Ambulatory Visit | Attending: Hematology | Admitting: Hematology

## 2014-11-12 DIAGNOSIS — R591 Generalized enlarged lymph nodes: Secondary | ICD-10-CM | POA: Diagnosis not present

## 2014-11-12 DIAGNOSIS — K220 Achalasia of cardia: Secondary | ICD-10-CM | POA: Insufficient documentation

## 2014-11-12 DIAGNOSIS — Z9081 Acquired absence of spleen: Secondary | ICD-10-CM | POA: Diagnosis not present

## 2014-11-12 LAB — CBC WITH DIFFERENTIAL/PLATELET
BASO%: 0.3 % (ref 0.0–2.0)
Basophils Absolute: 0 10*3/uL (ref 0.0–0.1)
EOS%: 0.6 % (ref 0.0–7.0)
Eosinophils Absolute: 0.1 10*3/uL (ref 0.0–0.5)
HCT: 36.9 % (ref 34.8–46.6)
HEMOGLOBIN: 11.7 g/dL (ref 11.6–15.9)
LYMPH%: 40.5 % (ref 14.0–49.7)
MCH: 29.8 pg (ref 25.1–34.0)
MCHC: 31.7 g/dL (ref 31.5–36.0)
MCV: 93.9 fL (ref 79.5–101.0)
MONO#: 1.6 10*3/uL — ABNORMAL HIGH (ref 0.1–0.9)
MONO%: 16.5 % — AB (ref 0.0–14.0)
NEUT#: 4 10*3/uL (ref 1.5–6.5)
NEUT%: 42.1 % (ref 38.4–76.8)
Platelets: 356 10*3/uL (ref 145–400)
RBC: 3.93 10*6/uL (ref 3.70–5.45)
RDW: 15.6 % — AB (ref 11.2–14.5)
WBC: 9.6 10*3/uL (ref 3.9–10.3)
lymph#: 3.9 10*3/uL — ABNORMAL HIGH (ref 0.9–3.3)

## 2014-11-12 LAB — COMPREHENSIVE METABOLIC PANEL (CC13)
ALT: 13 U/L (ref 0–55)
AST: 20 U/L (ref 5–34)
Albumin: 4.1 g/dL (ref 3.5–5.0)
Alkaline Phosphatase: 44 U/L (ref 40–150)
Anion Gap: 8 mEq/L (ref 3–11)
BUN: 9.2 mg/dL (ref 7.0–26.0)
CHLORIDE: 108 meq/L (ref 98–109)
CO2: 23 meq/L (ref 22–29)
Calcium: 9.5 mg/dL (ref 8.4–10.4)
Creatinine: 0.7 mg/dL (ref 0.6–1.1)
EGFR: >90 mL/min/{1.73_m2} (ref >90)
GLUCOSE: 95 mg/dL (ref 70–140)
POTASSIUM: 3.9 meq/L (ref 3.5–5.1)
Sodium: 139 mEq/L (ref 136–145)
Total Bilirubin: <0.3 mg/dL (ref 0.20–1.20)
Total Protein: 7.6 g/dL (ref 6.4–8.3)

## 2014-11-12 MED ORDER — IOHEXOL 300 MG/ML  SOLN
100.0000 mL | Freq: Once | INTRAMUSCULAR | Status: AC | PRN
  Administered 2014-11-12: 100 mL via INTRAVENOUS

## 2014-11-12 MED ORDER — IOHEXOL 300 MG/ML  SOLN: 50.0000 mL | Freq: Once | INTRAMUSCULAR | Status: DC | PRN

## 2014-11-19 ENCOUNTER — Ambulatory Visit (HOSPITAL_BASED_OUTPATIENT_CLINIC_OR_DEPARTMENT_OTHER): Payer: 59 | Admitting: Hematology

## 2014-11-19 ENCOUNTER — Telehealth: Payer: Self-pay | Admitting: Hematology

## 2014-11-19 ENCOUNTER — Encounter: Payer: Self-pay | Admitting: Hematology

## 2014-11-19 VITALS — BP 139/67 | HR 98 | Temp 97.1°F | Resp 20 | Ht 66.0 in | Wt 155.1 lb

## 2014-11-19 DIAGNOSIS — R591 Generalized enlarged lymph nodes: Secondary | ICD-10-CM | POA: Diagnosis not present

## 2014-11-19 DIAGNOSIS — Z862 Personal history of diseases of the blood and blood-forming organs and certain disorders involving the immune mechanism: Secondary | ICD-10-CM

## 2014-11-19 NOTE — Telephone Encounter (Signed)
Gave adn printed appt sched and avs for pt for OCT 2017

## 2014-11-19 NOTE — Progress Notes (Signed)
[place]  Telephone:(336) [phone] Fax:(336) [place] Note   Patient Care Team: Merrilee Seashore, MD as PCP - General (Internal Medicine) 11/19/2014  CHIEF COMPLAINTS:  Follow up lymphoadenopathy   HEM/ONC HISTORY: 1. She was seen by Dr. 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 Her anemia resolved afterwards and she has not required any treatment since then.  2. She had some right low abdomen pain on 01/04/2014 and she was seen at ED. CT abdomen and pelvis showed adenopathy E inguinal, external iliac, CT CAP in February 2016 showed axilla, supraclavicular and subpectoral adenopathy, progressive since 2011. 3. Left axillary lymph node biopsy on 05/03/2014 showed 2 benign lymph nodes, no malignancy.  INTERIM HISTORY: She returns for follow-up. She is doing well overall. She does have mild fatigue, no significant pain, dyspnea, or other symptoms. She denies any fever, chills, or weight loss.  MEDICAL HISTORY:  Past Medical History  Diagnosis Date  . Hemolytic anemia ([place]) 2012    Autoimmune   . Chlamydia 04/16/12    Treated   . Eczema     SURGICAL HISTORY: Past Surgical History  Procedure Laterality Date  . Splenectomy  2012   . Tonsillectomy  1990    SOCIAL HISTORY: Social History   Social History  . Marital Status: Single    Spouse Name: N/A  . Number of Children: N/A  . Years of Education: N/A   Occupational History  . Not on file.   Social History Main Topics  . Smoking status: Never Smoker   . Smokeless tobacco: Not on file  . Alcohol Use: Yes     Comment: Socially  . Drug Use: No  . Sexual Activity:    Partners: Male   Other Topics Concern  . Not on file   Social History Narrative    FAMILY HISTORY: Family History  Problem Relation Age of Onset  . Diabetes Mother   . Diabetes Brother   . Diabetes Maternal Grandmother    . Diabetes Paternal Grandmother   . Hypertension Mother   . Hypertension Paternal Grandmother   . Thyroid disease Father     Thyroid removed  . Depression Mother   NO family hsitory of blood disorder or malignancy   ALLERGIES:  has No Known Allergies.  MEDICATIONS:  Current Outpatient Prescriptions  Medication Sig Dispense Refill  . acetaminophen (TYLENOL) 500 MG tablet Take 500 mg by mouth every 6 (six) hours as needed. pain    . hydrocortisone cream 0.5 % Apply 1 application topically daily as needed. Apply to arms and legs as needed    . Multiple Vitamins-Minerals (MULTIVITAMIN PO) Take 1 tablet by mouth daily.      No current facility-administered medications for this visit.    REVIEW OF SYSTEMS:   Constitutional: Denies fevers, chills or abnormal night sweats Eyes: Denies blurriness of vision, double vision or watery eyes Ears, nose, mouth, throat, and face: Denies mucositis or sore throat Respiratory: Denies cough, dyspnea or wheezes Cardiovascular: Denies palpitation, chest discomfort or lower extremity swelling Gastrointestinal:  Denies nausea, heartburn or change in bowel habits Skin: Denies abnormal skin rashes Lymphatics: Denies new lymphadenopathy or easy bruising Neurological:Denies numbness, tingling or new weaknesses Behavioral/Psych: Mood is stable, no new changes  All other systems were reviewed with the patient and are negative.  PHYSICAL EXAMINATION: ECOG PERFORMANCE STATUS: 0 - Asymptomatic  Filed Vitals:  11/19/14 1251  BP: 139/67  Pulse: 98  Temp: 97.1 F (36.2 C)  Resp: 20   Filed Weights   11/19/14 1251  Weight: 155 lb 1.6 oz (70.353 kg)    GENERAL:alert, no distress and comfortable SKIN: skin color, texture, turgor are normal, no rashes or significant lesions EYES: normal, conjunctiva are pink and non-injected, sclera clear OROPHARYNX:no exudate, no erythema and lips, buccal mucosa, and tongue normal  NECK: supple, thyroid normal size,  non-tender, without nodularity LYMPH:  no palpable lymphadenopathy in bilateral cervical, supraclavicular or axilla, 2-3 enlarged lymph nodes in the right inguinal, measuring about 2 cm. No palpable lymph nodes in the left inguinal. LUNGS: clear to auscultation and percussion with normal breathing effort HEART: regular rate & rhythm and no murmurs and no lower extremity edema ABDOMEN:abdomen soft, non-tender and normal bowel sounds Musculoskeletal:no cyanosis of digits and no clubbing  PSYCH: alert & oriented x 3 with fluent speech NEURO: no focal motor/sensory deficits  LABORATORY DATA:  I have reviewed the data as listed Lab Results  Component Value Date   WBC 9.6 11/12/2014   HGB 11.7 11/12/2014   HCT 36.9 11/12/2014   MCV 93.9 11/12/2014   PLT 356 11/12/2014    Recent Labs  01/04/14 0055 03/05/14 1224 05/14/14 1135 11/12/14 0817  NA 141 138 141 139  K 4.5 4.5 4.2 3.9  CL 103  --   --   --   CO2 25 21* 22 23  GLUCOSE 109* 97 112 95  BUN 10 12.7 11.2 9.2  CREATININE 0.81 0.7 0.7 0.7  CALCIUM 9.7 9.0 9.1 9.5  GFRNONAA >90  --   --   --   GFRAA >90  --   --   --   PROT 7.8 7.9 7.7 7.6  ALBUMIN 3.9 4.2 4.0 4.1  AST 23 22 19 20   ALT 18 22 14 13   ALKPHOS 48 49 46 44  BILITOT <0.2* 0.29 0.29 <0.30   PATHOLOGY REPORT: Diagnosis 05/03/2014 Breast, left, needle core biopsy, left axillary lymph node - BENIGN LYMPH NODE WITH DERMATOPATHIC CHANGE. - NO MALIGNANCY IDENTIFIED. Microscopic Comment Flow cytometry ([id number]) is negative for a monoclonal B-cell or phenotypically aberrant T-cell population. Preliminary results were called to [facility] on 05/04/2014. Interpretation Tissue-Flow Cytometry - NO MONOCLONAL B-CELL OR PHENOTYPICALLY ABERRANT T-CELL POPULATION.  RADIOGRAPHIC STUDIES: I have personally reviewed the radiological images as listed and agreed with the findings in the report.  CT abdomen and pelvis with IV contrast on  02/03/2014 PERITONEUM/RETROPERITONEUM: No intraperitoneal free fluid nor free air. Aortoiliac vessels are normal in course and caliber. 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 Increasing pelvic wall lymphadenopathy measuring up to the 9 mm, previously 7 mm Inguinal lymphadenopathy measuring up to 14 mm short axis. External iliac lymphadenopathy, measuring 10 mm on the RIGHT.  SOFT TISSUE/OSSEOUS STRUCTURES: Nonsuspicious.  IMPRESSION: No acute intra-abdominal nor pelvic process.  Status post splenectomy. Worsening pelvic lymphadenopathy.  CT chest, abdomen and pelvis on 03/26/2014 IMPRESSION: 1. Axillary, supraclavicular and subpectoral adenopathy progressive since prior chest CT from 2011. 2. Stable pelvic and inguinal adenopathy since abdominal CT scan 01/04/2014. 3. No mesenteric or retroperitoneal adenopathy. 4. Status post splenectomy with enlarging left upper quadrant splenule wounds. 5. Persistent esophageal dilatation. Possible achalasia. 6. Mild/early emphysematous changes.  CT abdomen and pelvis with contrast 11/12/2014 IMPRESSION: 1. Stable axillary, subpectoral, pelvic and inguinal lymphadenopathy. 2. No acute findings in the chest, abdomen or pelvis. 3.  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 4. Markedly dilated esophagus consistent with achalasia.   ASSESSMENT & PLAN:  29 year old African-American female, history of hemolytic anemia status post splenectomy. She was found to have multiple enlarged lymph nodes in the abdomen and inguinal area on the CT scan in November 2015, CT chest showed multiple axillary and supraclavicular nodes, larger than 2011.  1. Diffuse Adenopathy -I explained her restaging CT scan findings to her. Her pelvic and inguinal adenopathy are stable, however the axillary and mediastinal adenopathy has significantly grown since 2011. -She  has several diffuse enlarged lymph nodes and some are palpable on exam. -I reviewed her left axillary lymph node biopsy results with her, which was negative for malignant cells. -I reviewed her repeat a CT scan from 11/12/2014, which showed stable diffuse adenopathy. -Her lymphadenopathy is likely reactive, giving the negative biopsy and without any clinical symptoms.  -I'll follow her in one year. If she is clinically doing well, I do not plan to repeat her CT scans.   2. History of hemolytic anemia -Resolved. Status post splenectomy. -Hb 11.7 today, no lab evidence of hemolysis   3. Cancer screening I encouraged her to follow-up with her GYN and have Pap smear annually.  Follow-up:  1 year with lab CBC, CMP, LDH .   All questions were answered. The patient knows to call the clinic with any problems, questions or concerns. I spent 25 minutes counseling the patient face to face. The total time spent in the appointment was 30 minutes and more than 50% was on counseling.     Truitt Merle, MD 11/19/2014   9:21 PM

## 2015-01-06 ENCOUNTER — Ambulatory Visit (INDEPENDENT_AMBULATORY_CARE_PROVIDER_SITE_OTHER): Payer: 59 | Admitting: Obstetrics and Gynecology

## 2015-01-06 ENCOUNTER — Encounter: Payer: Self-pay | Admitting: Obstetrics and Gynecology

## 2015-01-06 VITALS — BP 138/80 | HR 88 | Resp 16 | Ht 65.5 in | Wt 152.0 lb

## 2015-01-06 DIAGNOSIS — N926 Irregular menstruation, unspecified: Secondary | ICD-10-CM | POA: Diagnosis not present

## 2015-01-06 DIAGNOSIS — R350 Frequency of micturition: Secondary | ICD-10-CM | POA: Diagnosis not present

## 2015-01-06 DIAGNOSIS — Z0000 Encounter for general adult medical examination without abnormal findings: Secondary | ICD-10-CM | POA: Diagnosis not present

## 2015-01-06 DIAGNOSIS — Z01419 Encounter for gynecological examination (general) (routine) without abnormal findings: Secondary | ICD-10-CM

## 2015-01-06 DIAGNOSIS — Z124 Encounter for screening for malignant neoplasm of cervix: Secondary | ICD-10-CM

## 2015-01-06 LAB — POCT URINALYSIS DIPSTICK
Bilirubin, UA: NEGATIVE
Glucose, UA: NEGATIVE
Ketones, UA: NEGATIVE
Leukocytes, UA: NEGATIVE
Nitrite, UA: NEGATIVE
PH UA: 6.5
PROTEIN UA: NEGATIVE
UROBILINOGEN UA: NEGATIVE

## 2015-01-06 MED ORDER — NAPROXEN SODIUM 550 MG PO TABS: ORAL_TABLET | ORAL | Status: DC

## 2015-01-06 NOTE — Patient Instructions (Signed)
Preparing for Pregnancy Before trying to become pregnant, make an appointment with your health care provider (preconception care). The goal is to help you have a healthy, safe pregnancy. At your first appointment, your health care provider will:   Do a complete physical exam, including a Pap test.  Take a complete medical history.  Give you advice and help you resolve any problems. PRECONCEPTION CHECKLIST Here is a list of the basics to cover with your health care provider at your preconception visit:  Medical history.  Tell your health care provider about any diseases you have had. Many diseases can affect your pregnancy.  Include your partner's medical history and family history.  Make sure you have been tested for sexually transmitted infections (STIs). These can affect your pregnancy. In some cases, they can be passed to your baby. Tell your health care provider about any history of STIs.  Make sure your health care provider knows about any previous problems you have had with conception or pregnancy.  Tell your health care provider about any medicine you take. This includes herbal supplements and over-the-counter medicines.  Make sure all your immunizations are up to date. You may need to make additional appointments.  Ask your health care provider if you need any vaccinations or if there are any you should avoid.  Diet.  It is especially important to eat a healthy, balanced diet with the right nutrients when you are pregnant.  Ask your health care provider to help you get to a healthy weight before pregnancy.  If you are overweight, you are at higher risk for certain complications. These include high blood pressure, diabetes, and preterm birth.  If you are underweight, you are more likely to have a low-birth-weight baby.  Lifestyle.  Tell your health care provider about lifestyle factors such as alcohol use, drug use, or smoking.  Describe any harmful substances you may  be exposed to at work or home. These can include chemicals, pesticides, and radiation.  Mental health.  Let your health care provider know if you have been feeling depressed or anxious.  Let your health care provider know if you have a history of substance abuse.  Let your health care provider know if you do not feel safe at home. HOME INSTRUCTIONS TO PREPARE FOR PREGNANCY Follow your health care provider's advice and instructions.   Keep an accurate record of your menstrual periods. This makes it easier for your health care provider to determine your baby's due date.  Begin taking prenatal vitamins and folic acid supplements daily. Take them as directed by your health care provider.  Eat a balanced diet. Get help from a nutrition counselor if you have questions or need help.  Get regular exercise. Try to be active for at least 30 minutes a day most days of the week.  Quit smoking, if you smoke.  Do not drink alcohol.  Do not take illegal drugs.  Get medical problems, such as diabetes or high blood pressure, under control.  If you have diabetes, make sure you do the following:  Have good blood sugar control. If you have type 1 diabetes, use multiple daily doses of insulin. Do not use split-dose or premixed insulin.  Have an eye exam by a qualified eye care professional trained in caring for people with diabetes.  Get evaluated by your health care provider for cardiovascular disease.  Get to a healthy weight. If you are overweight or obese, reduce your weight with the help of a qualified health   professional such as a Firefighter. Ask your health care provider what the right weight range is for you. HOW DO I KNOW I AM PREGNANT? You may be pregnant if you have been sexually active and you miss your period. Symptoms of early pregnancy include:   Mild cramping.  Very light vaginal bleeding (spotting).  Feeling unusually tired.  Morning sickness. If you have any of  these symptoms, take a home pregnancy test. These tests look for a hormone called human chorionic gonadotropin (hCG) in your urine. Your body begins to make this hormone during early pregnancy. These tests are very accurate. Wait until at least the first day you miss your period to take one. If you get a positive result, call your health care provider to make appointments for prenatal care. WHAT SHOULD I DO IF I BECOME PREGNANT?  Make an appointment with your health care provider by week 12 of your pregnancy at the latest.  Do not smoke. Smoking can be harmful to your baby.  Do not drink alcoholic beverages. Alcohol is related to a number of birth defects.  Avoid toxic odors and chemicals.  You may continue to have sexual intercourse if it does not cause pain or other problems, such as vaginal bleeding.   This information is not intended to replace advice given to you by your health care provider. Make sure you discuss any questions you have with your health care provider.   Document Released: 01/05/2008 Document Revised: 02/12/2014 Document Reviewed: 12/29/2012 Elsevier Interactive Patient Education 2016 [place] AND DIET:  We recommended that you start or continue a regular exercise program for good health. Regular exercise means any activity that makes your heart beat faster and makes you sweat.  We recommend exercising at least 30 minutes per day at least 3 days a week, preferably 4 or 5.  We also recommend a diet low in fat and sugar.  Inactivity, poor dietary choices and obesity can cause diabetes, heart attack, stroke, and kidney damage, among others.    ALCOHOL AND SMOKING:  Women should limit their alcohol intake to no more than 7 drinks/beers/glasses of wine (combined, not each!) per week. Moderation of alcohol intake to this level decreases your risk of breast cancer and liver damage. And of course, no recreational drugs are part of a healthy lifestyle.  And  absolutely no smoking or even second hand smoke. Most people know smoking can cause heart and lung diseases, but did you know it also contributes to weakening of your bones? Aging of your skin?  Yellowing of your teeth and nails?  CALCIUM AND VITAMIN D:  Adequate intake of calcium and Vitamin D are recommended.  The recommendations for exact amounts of these supplements seem to change often, but generally speaking 600 mg of calcium (either carbonate or citrate) and 800 units of Vitamin D per day seems prudent. Certain women may benefit from higher intake of Vitamin D.  If you are among these women, your doctor will have told you during your visit.    PAP SMEARS:  Pap smears, to check for cervical cancer or precancers,  have traditionally been done yearly, although recent scientific advances have shown that most women can have pap smears less often.  However, every woman still should have a physical exam from her gynecologist every year. It will include a breast check, inspection of the vulva and vagina to check for abnormal growths or skin changes, a visual exam of the cervix, and then  an exam to evaluate the size and shape of the uterus and ovaries.  And after 31 years of age, a rectal exam is indicated to check for rectal cancers. We will also provide age appropriate advice regarding health maintenance, like when you should have certain vaccines, screening for sexually transmitted diseases, bone density testing, colonoscopy, mammograms, etc.   MAMMOGRAMS:  All women over 56 years old should have a yearly mammogram. Many facilities now offer a "3D" mammogram, which may cost around $50 extra out of pocket. If possible,  we recommend you accept the option to have the 3D mammogram performed.  It both reduces the number of women who will be called back for extra views which then turn out to be normal, and it is better than the routine mammogram at detecting truly abnormal areas.    COLONOSCOPY:  Colonoscopy to  screen for colon cancer is recommended for all women at age 50.  We know, you hate the idea of the prep.  We agree, BUT, having colon cancer and not knowing it is worse!!  Colon cancer so often starts as a polyp that can be seen and removed at colonscopy, which can quite literally save your life!  And if your first colonoscopy is normal and you have no family history of colon cancer, most women don't have to have it again for 10 years.  Once every ten years, you can do something that may end up saving your life, right?  We will be happy to help you get it scheduled when you are ready.  Be sure to check your insurance coverage so you understand how much it will cost.  It may be covered as a preventative service at no cost, but you should check your particular policy.

## 2015-01-06 NOTE — Progress Notes (Signed)
Patient ID: Casey Chapman, female   DOB: 1985-02-10, 29 y.o.   MRN: [id number] 29 y.o. [id number] SingleAfrican AmericanF here for annual exam.  She typically has cycles q month, she was just one month late for her cycle. Cramps can be severe, ibuprofen takes the edge off, can end up stuck in bed. No BTB. She hasn't been sexually active since August, 3 negative pregnancy tests within the last month. She does currently have a partner, lives together. Not actively trying to get pregnant, not preventing. No pain with intercourse.  Period Duration (Days): 5-7 days  Period Pattern: Regular Menstrual Flow: Moderate, Heavy Menstrual Control: Tampon Menstrual Control Change Freq (Hours): changes tampon every 2 -3 hours on heavy days  Dysmenorrhea: (!) Moderate Dysmenorrhea Symptoms: Cramping  Patient's last menstrual period was 01/05/2015.          Sexually active: Yes.    The current method of family planning is none.    Exercising: No.  The patient does not participate in regular exercise at present. Smoker:  no  Health Maintenance: Pap:  04-16-12 ASCUS HR HPV  History of abnormal Pap:  Yes 2014  TDaP:  04-29-13 Gardasil: unsure   reports that she has never smoked. She has never used smokeless tobacco. She reports that she drinks alcohol. She reports that she does not use illicit drugs. 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 Chlamydia 04/16/12    Treated   . Eczema     Past Surgical History  Procedure Laterality Date  . Splenectomy  2012   . Tonsillectomy  1990    Current Outpatient Prescriptions  Medication Sig Dispense Refill  . acetaminophen (TYLENOL) 500 MG tablet Take 500 mg by mouth every 6 (six) hours as needed. pain    . hydrocortisone cream 0.5 % Apply 1 application topically daily as needed. Apply to arms and legs as needed    . Multiple Vitamins-Minerals (MULTIVITAMIN PO) Take 1 tablet by mouth daily.       No current facility-administered medications for this visit.    Family History  Problem Relation Age of Onset  . Diabetes Mother   . Diabetes Brother   . Diabetes Maternal Grandmother   . Diabetes Paternal Grandmother   . Hypertension Mother   . Hypertension Paternal Grandmother   . Thyroid disease Father     Thyroid removed  . Depression Mother     Review of Systems  Constitutional: Negative.   HENT: Negative.   Eyes: Negative.   Respiratory: Negative.   Cardiovascular: Negative.   Gastrointestinal: Negative.   Endocrine: Positive for cold intolerance and heat intolerance.  Genitourinary: Positive for frequency.       Menstrual changes- skipped a cycle last month  Musculoskeletal: Negative.   Skin: Negative.   Allergic/Immunologic: Negative.   Neurological: Negative.   Psychiatric/Behavioral: Negative.   She c/o urinary frequency, she has intermittent nocturia up to 3 x a night, small amounts. Not frequent during the day. No dysuria, sometimes have urgency.   Exam:   BP 138/80 mmHg  Pulse 88  Resp 16  Ht 5' 5.5" (1.664 m)  Wt 152 lb (68.947 kg)  BMI 24.90 kg/m2  LMP 01/05/2015  Weight change: @WEIGHTCHANGE @ Height:   Height: 5' 5.5" (166.4 cm)  Ht Readings from Last 3 Encounters:  01/06/15 5' 5.5" (1.664 m)  11/19/14 5\' 6"  (1.676 m)  05/14/14 5\' 6"  (1.676  m)    General appearance: alert, cooperative and appears stated age Head: Normocephalic, without obvious abnormality, atraumatic Neck: no adenopathy, supple, symmetrical, trachea midline and thyroid normal to inspection and palpation Lungs: clear to auscultation bilaterally Breasts: normal appearance, no masses or tenderness Heart: regular rate and rhythm Abdomen: soft, non-tender; bowel sounds normal; no masses,  no organomegaly Extremities: extremities normal, atraumatic, no cyanosis or edema Skin: Skin color, texture, turgor normal. No rashes or lesions Lymph nodes: Cervical, supraclavicular, and  axillary nodes normal. No abnormal inguinal nodes palpated Neurologic: Grossly normal   Pelvic: External genitalia:  no lesions              Urethra:  normal appearing urethra with no masses, tenderness or lesions              Bartholins and Skenes: normal                 Vagina: normal appearing vagina with normal color and discharge, no lesions              Cervix: no lesions               Bimanual Exam:  Uterus:  normal size, contour, position, consistency, mobility, non-tender              Adnexa: no mass, fullness, tenderness               Rectovaginal: Confirms               Anus:  normal sphincter tone, no lesions  Chaperone was present for exam.  A:  Well Woman with normal exam   Recently with skipped cycle, negative UPT's  Cold intolerance  Not using contraception  Not anemic on recent blood work  Nocturia, normal glucose testing with her primary  Dysmenorrhea  P:   Pap with hpv  TSH  Anaprox for cramps  Start PNV  ccua for ua, c&s   Continue breast self exam  Discussed with her h/o chlamydia that she will need early pregnancy screening and ultrasound

## 2015-01-07 ENCOUNTER — Telehealth: Payer: Self-pay | Admitting: Emergency Medicine

## 2015-01-07 LAB — TSH: TSH: 2.283 u[IU]/mL (ref 0.350–4.500)

## 2015-01-07 LAB — URINALYSIS, MICROSCOPIC ONLY
Bacteria, UA: NONE SEEN [HPF]
CASTS: NONE SEEN [LPF]
RBC / HPF: NONE SEEN RBC/HPF (ref <=2)
WBC, UA: NONE SEEN WBC/HPF (ref <=5)
Yeast: NONE SEEN [HPF]

## 2015-01-07 LAB — PROLACTIN: Prolactin: 21.3 ng/mL

## 2015-01-07 NOTE — Telephone Encounter (Signed)
-----   Message from [name], MD sent at 01/07/2015 10:13 AM EST ----- Please inform the patient that she has some crystal on her urine dip, this can be a risk for developing kidney stones. She should make sure to stay well hydrated. Her urine culture is pending. The rest of the blood work was normal.

## 2015-01-07 NOTE — Telephone Encounter (Signed)
Called patient and gave results from Dr. Talbert Nan. Patient verbalized understanding and will await results of urine culture and pap smear.  Routing to provider for final review. Patient agreeable to disposition. Will close encounter.

## 2015-01-08 LAB — URINE CULTURE
Colony Count: NO GROWTH
ORGANISM ID, BACTERIA: NO GROWTH

## 2015-01-10 ENCOUNTER — Telehealth: Payer: Self-pay | Admitting: *Deleted

## 2015-01-10 LAB — IPS PAP TEST WITH HPV

## 2015-01-10 NOTE — Telephone Encounter (Signed)
LMTC for lab results -eh  

## 2015-01-10 NOTE — Telephone Encounter (Signed)
-----   Message from [name], MD sent at 01/09/2015  3:16 PM EST ----- Please advise the patient of normal results.

## 2015-01-11 NOTE — Telephone Encounter (Signed)
Patient aware of lab results- see result note -eh

## 2015-08-22 ENCOUNTER — Other Ambulatory Visit: Payer: Self-pay | Admitting: Hematology

## 2015-11-24 ENCOUNTER — Telehealth: Payer: Self-pay | Admitting: Hematology

## 2015-11-24 NOTE — Telephone Encounter (Signed)
10/20 appointment time rescheduled to later in the afternoon per patient request. Patient could not make other appointments per her work schedule.

## 2015-11-25 ENCOUNTER — Ambulatory Visit: Payer: 59 | Admitting: Hematology

## 2015-11-25 ENCOUNTER — Ambulatory Visit (HOSPITAL_BASED_OUTPATIENT_CLINIC_OR_DEPARTMENT_OTHER): Payer: 59 | Admitting: Hematology

## 2015-11-25 ENCOUNTER — Other Ambulatory Visit (HOSPITAL_BASED_OUTPATIENT_CLINIC_OR_DEPARTMENT_OTHER): Payer: 59

## 2015-11-25 ENCOUNTER — Other Ambulatory Visit: Payer: 59

## 2015-11-25 ENCOUNTER — Other Ambulatory Visit: Payer: Self-pay | Admitting: *Deleted

## 2015-11-25 VITALS — BP 147/88 | HR 110 | Temp 98.7°F | Resp 17 | Ht 65.5 in | Wt 131.5 lb

## 2015-11-25 DIAGNOSIS — R591 Generalized enlarged lymph nodes: Secondary | ICD-10-CM

## 2015-11-25 DIAGNOSIS — R599 Enlarged lymph nodes, unspecified: Secondary | ICD-10-CM

## 2015-11-25 DIAGNOSIS — R634 Abnormal weight loss: Secondary | ICD-10-CM | POA: Diagnosis not present

## 2015-11-25 DIAGNOSIS — N951 Menopausal and female climacteric states: Secondary | ICD-10-CM | POA: Diagnosis not present

## 2015-11-25 DIAGNOSIS — Z862 Personal history of diseases of the blood and blood-forming organs and certain disorders involving the immune mechanism: Secondary | ICD-10-CM

## 2015-11-25 LAB — CBC WITH DIFFERENTIAL/PLATELET
BASO%: 0.4 % (ref 0.0–2.0)
Basophils Absolute: 0 10*3/uL (ref 0.0–0.1)
EOS%: 0.5 % (ref 0.0–7.0)
Eosinophils Absolute: 0.1 10*3/uL (ref 0.0–0.5)
HCT: 38.2 % (ref 34.8–46.6)
HGB: 12.2 g/dL (ref 11.6–15.9)
LYMPH%: 41.6 % (ref 14.0–49.7)
MCH: 29 pg (ref 25.1–34.0)
MCHC: 31.9 g/dL (ref 31.5–36.0)
MCV: 90.7 fL (ref 79.5–101.0)
MONO#: 1.4 10*3/uL — ABNORMAL HIGH (ref 0.1–0.9)
MONO%: 14.4 % — ABNORMAL HIGH (ref 0.0–14.0)
NEUT#: 4.2 10*3/uL (ref 1.5–6.5)
NEUT%: 43.1 % (ref 38.4–76.8)
Platelets: 206 10*3/uL (ref 145–400)
RBC: 4.21 10*6/uL (ref 3.70–5.45)
RDW: 16.3 % — ABNORMAL HIGH (ref 11.2–14.5)
WBC: 9.8 10*3/uL (ref 3.9–10.3)
lymph#: 4.1 10*3/uL — ABNORMAL HIGH (ref 0.9–3.3)

## 2015-11-25 LAB — COMPREHENSIVE METABOLIC PANEL
ALT: 14 U/L (ref 0–55)
ANION GAP: 11 meq/L (ref 3–11)
AST: 22 U/L (ref 5–34)
Albumin: 4.2 g/dL (ref 3.5–5.0)
Alkaline Phosphatase: 55 U/L (ref 40–150)
BUN: 11.3 mg/dL (ref 7.0–26.0)
CALCIUM: 9.9 mg/dL (ref 8.4–10.4)
CHLORIDE: 105 meq/L (ref 98–109)
CO2: 23 meq/L (ref 22–29)
CREATININE: 0.6 mg/dL (ref 0.6–1.1)
Glucose: 101 mg/dl (ref 70–140)
POTASSIUM: 4 meq/L (ref 3.5–5.1)
Sodium: 139 mEq/L (ref 136–145)
Total Bilirubin: 0.28 mg/dL (ref 0.20–1.20)
Total Protein: 8.9 g/dL — ABNORMAL HIGH (ref 6.4–8.3)

## 2015-11-25 LAB — LACTATE DEHYDROGENASE: LDH: 251 U/L — ABNORMAL HIGH (ref 125–245)

## 2015-11-25 NOTE — Progress Notes (Signed)
Casey Chapman  [phone] Fax:([phone]) [place] Note   Patient Care Team: Merrilee Seashore, MD as PCP - General (Internal Medicine) 11/25/2015  CHIEF COMPLAINTS:  Follow up lymphoadenopathy   HEM/ONC HISTORY: 1. She was seen by Dr. 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 Her anemia resolved afterwards and she has not required any treatment since then.  2. She had some right low abdomen pain on 01/04/2014 and she was seen at ED. CT abdomen and pelvis showed adenopathy E inguinal, external iliac, CT CAP in February 2016 showed axilla, supraclavicular and subpectoral adenopathy, progressive since 2011. 3. Left axillary lymph node biopsy on 05/03/2014 showed 2 benign lymph nodes, no malignancy.  INTERIM HISTORY: She returns for follow-up. She has unintentionally lost about 20 pounds in the past 11 months, she does have good appetite and eating well, no extra stress in her life today, she denies depression. She also notices intermittent night sweats, sometimes her cloth gets wet in the morning, no fever or chills. No pain, or other complaints. She has good appetite, remains to be physically active.  MEDICAL HISTORY:  Past Medical History:  Diagnosis Date  . Chlamydia 04/16/12   Treated   . Eczema   . Hemolytic anemia ([place]) 2012   Autoimmune     SURGICAL HISTORY: Past Surgical History:  Procedure Laterality Date  . SPLENECTOMY  2012   . TONSILLECTOMY  1990    SOCIAL HISTORY: Social History   Social History  . Marital status: Single    Spouse name: N/A  . Number of children: N/A  . Years of education: N/A   Occupational History  . Not on file.   Social History Main Topics  . Smoking status: Never Smoker  . Smokeless tobacco: Never Used  . Alcohol use 0.0 oz/week     Comment: Socially  . Drug use: No  . Sexual activity: Yes   Partners: Male   Other Topics Concern  . Not on file   Social History Narrative  . No narrative on file    FAMILY HISTORY: Family History  Problem Relation Age of Onset  . Diabetes Mother   . Diabetes Brother   . Diabetes Maternal Grandmother   . Diabetes Paternal Grandmother   . Hypertension Mother   . Hypertension Paternal Grandmother   . Thyroid disease Father     Thyroid removed  . Depression Mother   NO family hsitory of blood disorder or malignancy   ALLERGIES:  has No Known Allergies.  MEDICATIONS:  Current Outpatient Prescriptions  Medication Sig Dispense Refill  . acetaminophen (TYLENOL) 500 MG tablet Take 500 mg by mouth every 6 (six) hours as needed. pain    . hydrocortisone cream 0.5 % Apply 1 application topically daily as needed. Apply to arms and legs as needed    . Multiple Vitamins-Minerals (MULTIVITAMIN PO) Take 1 tablet by mouth daily.      No current facility-administered medications for this visit.     REVIEW OF SYSTEMS:   Constitutional: Denies fevers, chills or abnormal night sweats Eyes: Denies blurriness of vision, double vision or watery eyes Ears, nose, mouth, throat, and face: Denies mucositis or sore throat Respiratory: Denies cough, dyspnea or wheezes Cardiovascular: Denies palpitation, chest discomfort or lower extremity swelling Gastrointestinal:  Denies nausea, heartburn or change in bowel habits Skin: Denies abnormal skin rashes Lymphatics: Denies new lymphadenopathy or easy  bruising Neurological:Denies numbness, tingling or new weaknesses Behavioral/Psych: Mood is stable, no new changes  All other systems were reviewed with the patient and are negative.  PHYSICAL EXAMINATION: ECOG PERFORMANCE STATUS: 0 - Asymptomatic  Vitals:   11/25/15 1432  BP: (!) 147/88  Pulse: (!) 110  Resp: 17  Temp: 98.7 F (37.1 C)   Filed Weights   11/25/15 1432  Weight: 131 lb 8 oz (59.6 kg)    GENERAL:alert, no distress and  comfortable SKIN: skin color, texture, turgor are normal, no rashes or significant lesions EYES: normal, conjunctiva are pink and non-injected, sclera clear OROPHARYNX:no exudate, no erythema and lips, buccal mucosa, and tongue normal  NECK: supple, thyroid normal size, non-tender, without nodularity LYMPH:  no palpable lymphadenopathy in bilateral cervical, supraclavicular or axilla, 2-3 enlarged lymph nodes in the right inguinal, measuring about 1.5 cm. No palpable lymph nodes in the left inguinal. LUNGS: clear to auscultation and percussion with normal breathing effort HEART: regular rate & rhythm and no murmurs and no lower extremity edema ABDOMEN:abdomen soft, non-tender and normal bowel sounds Musculoskeletal:no cyanosis of digits and no clubbing  PSYCH: alert & oriented x 3 with fluent speech NEURO: no focal motor/sensory deficits  LABORATORY DATA:  I have reviewed the data as listed Lab Results  Component Value Date   WBC 9.8 11/25/2015   HGB 12.2 11/25/2015   HCT 38.2 11/25/2015   MCV 90.7 11/25/2015   PLT 206 11/25/2015    Recent Labs  11/25/15 1402  NA 139  K 4.0  CO2 23  GLUCOSE 101  BUN 11.3  CREATININE 0.6  CALCIUM 9.9  PROT 8.9*  ALBUMIN 4.2  AST 22  ALT 14  ALKPHOS 55  BILITOT 0.28   PATHOLOGY REPORT: Diagnosis 05/03/2014 Breast, left, needle core biopsy, left axillary lymph node - BENIGN LYMPH NODE WITH DERMATOPATHIC CHANGE. - NO MALIGNANCY IDENTIFIED. Microscopic Comment Flow cytometry [id number]) is negative for a monoclonal B-cell or phenotypically aberrant T-cell population. Preliminary results were called to [facility] on 05/04/2014. Interpretation Tissue-Flow Cytometry - NO MONOCLONAL B-CELL OR PHENOTYPICALLY ABERRANT T-CELL POPULATION.  RADIOGRAPHIC STUDIES: I have personally reviewed the radiological images as listed and agreed with the findings in the report.  CT abdomen and pelvis with IV contrast on  02/03/2014 PERITONEUM/RETROPERITONEUM: No intraperitoneal free fluid nor free air. Aortoiliac vessels are normal in course and caliber. 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 Increasing pelvic wall lymphadenopathy measuring up to the 9 mm, previously 7 mm Inguinal lymphadenopathy measuring up to 14 mm short axis. External iliac lymphadenopathy, measuring 10 mm on the RIGHT.  SOFT TISSUE/OSSEOUS STRUCTURES: Nonsuspicious.  IMPRESSION: No acute intra-abdominal nor pelvic process.  Status post splenectomy. Worsening pelvic lymphadenopathy.  CT chest, abdomen and pelvis on 03/26/2014 IMPRESSION: 1. Axillary, supraclavicular and subpectoral adenopathy progressive since prior chest CT from 2011. 2. Stable pelvic and inguinal adenopathy since abdominal CT scan 01/04/2014. 3. No mesenteric or retroperitoneal adenopathy. 4. Status post splenectomy with enlarging left upper quadrant splenule wounds. 5. Persistent esophageal dilatation. Possible achalasia. 6. Mild/early emphysematous changes.  CT abdomen and pelvis with contrast 11/12/2014 IMPRESSION: 1. Stable axillary, subpectoral, pelvic and inguinal lymphadenopathy. 2. No acute findings in the chest, abdomen or pelvis. 3. 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 4. Markedly dilated esophagus consistent with achalasia.   ASSESSMENT & PLAN:  30 year old African-American female, history of hemolytic anemia status post splenectomy. She was found to have multiple enlarged lymph  nodes in the abdomen and inguinal area on the CT scan in November 2015, CT chest showed multiple axillary and supraclavicular nodes, larger than 2011.  1. Diffuse Adenopathy -I previously explained her restaging CT scan findings to her. Her pelvic and inguinal adenopathy are stable, however the axillary and mediastinal adenopathy has significantly grown since  2011. -She has several diffuse enlarged lymph nodes and some are palpable on exam. -I reviewed her left axillary lymph node biopsy results with her, which was negative for malignant cells. -I reviewed her repeat a CT scan from 11/12/2014, which showed stable diffuse adenopathy. -Her lymphadenopathy is likely reactive. -She has developed night sweats, and unintentional weight loss about 20 pounds. I reviewed her lab, unremarkable. Her physical exam showed stable small right inguinal lymph nodes, otherwise negative -I recommend her to have a repeated CT scan in the next 2-3 weeks. If scan shows no worsening adenopathy or other new findings, then I do not think this is lymphoma.  I recommend her follow-up with her primary care physician.  2. History of hemolytic anemia -Resolved. Status post splenectomy. -Hb 12.2  today, no lab evidence of hemolysis   3. Cancer screening I encouraged her to follow-up with her GYN and have Pap smear annually.  4. Weight loss, night sweats -will obtain a repeated CT scan    Plan -CT CAP with contrast in 2-3 weeks, I will call her with the result, or see her back if her CT scan is abnormal -If her CT scans shows no worsening adenopathy or other new findings, I recommend her follow-up with her primary care physician and I'll see her as needed in the future.  All questions were answered. The patient knows to call the clinic with any problems, questions or concerns. I spent 25 minutes counseling the patient face to face. The total time spent in the appointment was 30 minutes and more than 50% was on counseling.     Truitt Merle, MD 11/25/2015   3:23 PM

## 2015-11-26 ENCOUNTER — Encounter: Payer: Self-pay | Admitting: Hematology

## 2015-12-09 ENCOUNTER — Ambulatory Visit (HOSPITAL_COMMUNITY)
Admission: RE | Admit: 2015-12-09 | Discharge: 2015-12-09 | Disposition: A | Discharge status: Home / Self Care | Payer: 59 | Source: Ambulatory Visit | Attending: Hematology | Admitting: Hematology

## 2015-12-09 DIAGNOSIS — J984 Other disorders of lung: Secondary | ICD-10-CM | POA: Insufficient documentation

## 2015-12-09 DIAGNOSIS — R591 Generalized enlarged lymph nodes: Secondary | ICD-10-CM | POA: Insufficient documentation

## 2015-12-09 DIAGNOSIS — Z9081 Acquired absence of spleen: Secondary | ICD-10-CM | POA: Diagnosis not present

## 2015-12-09 MED ORDER — IOPAMIDOL (ISOVUE-300) INJECTION 61%
100.0000 mL | Freq: Once | INTRAVENOUS | Status: AC | PRN
  Administered 2015-12-09: 100 mL via INTRAVENOUS

## 2015-12-19 ENCOUNTER — Other Ambulatory Visit: Payer: Self-pay | Admitting: Hematology

## 2015-12-19 ENCOUNTER — Telehealth: Payer: Self-pay | Admitting: Hematology

## 2015-12-19 DIAGNOSIS — J984 Other disorders of lung: Secondary | ICD-10-CM

## 2015-12-19 NOTE — Telephone Encounter (Signed)
I called patient, and left [place] message on her cell phone number regarding her CT scan findings. She has stable mild adenopathy, unchanged from prior scan. I do not think this needs to be followed anymore. However her CT scan showed multiple cystic lesions in her lungs, worse than last scan, there are new since a few years ago. I will refer her to pulmonology for further evaluation. She is encouraged to call me back if she has any questions.  I'll see her as needed in the future.  Casey Chapman  12/19/2015

## 2016-01-12 ENCOUNTER — Ambulatory Visit: Payer: 59 | Admitting: Obstetrics and Gynecology

## 2016-05-02 ENCOUNTER — Emergency Department (HOSPITAL_COMMUNITY): Payer: 59

## 2016-05-02 ENCOUNTER — Emergency Department (HOSPITAL_COMMUNITY): Admit: 2016-05-02 | Payer: 59

## 2016-05-02 ENCOUNTER — Encounter (HOSPITAL_COMMUNITY): Payer: Self-pay | Admitting: Emergency Medicine

## 2016-05-02 ENCOUNTER — Emergency Department (HOSPITAL_COMMUNITY)
Admission: EM | Admit: 2016-05-02 | Discharge: 2016-05-02 | Disposition: A | Discharge status: Home / Self Care | Payer: 59 | Attending: Emergency Medicine | Admitting: Emergency Medicine

## 2016-05-02 DIAGNOSIS — R600 Localized edema: Secondary | ICD-10-CM | POA: Diagnosis not present

## 2016-05-02 DIAGNOSIS — R609 Edema, unspecified: Secondary | ICD-10-CM

## 2016-05-02 DIAGNOSIS — Z79899 Other long term (current) drug therapy: Secondary | ICD-10-CM | POA: Insufficient documentation

## 2016-05-02 DIAGNOSIS — R0600 Dyspnea, unspecified: Secondary | ICD-10-CM | POA: Diagnosis not present

## 2016-05-02 DIAGNOSIS — M7989 Other specified soft tissue disorders: Secondary | ICD-10-CM | POA: Diagnosis present

## 2016-05-02 LAB — I-STAT TROPONIN, ED: Troponin i, poc: 0 ng/mL (ref 0.00–0.08)

## 2016-05-02 LAB — CBC WITH DIFFERENTIAL/PLATELET
BASOS ABS: 0 10*3/uL (ref 0.0–0.1)
BASOS PCT: 0 %
Eosinophils Absolute: 0.1 10*3/uL (ref 0.0–0.7)
Eosinophils Relative: 1 %
HEMATOCRIT: 34.3 % — AB (ref 36.0–46.0)
HEMOGLOBIN: 11 g/dL — AB (ref 12.0–15.0)
Lymphocytes Relative: 38 %
Lymphs Abs: 4.2 10*3/uL — ABNORMAL HIGH (ref 0.7–4.0)
MCH: 29.3 pg (ref 26.0–34.0)
MCHC: 32.1 g/dL (ref 30.0–36.0)
MCV: 91.5 fL (ref 78.0–100.0)
MONOS PCT: 15 %
Monocytes Absolute: 1.7 10*3/uL — ABNORMAL HIGH (ref 0.1–1.0)
NEUTROS PCT: 46 %
Neutro Abs: 5.1 10*3/uL (ref 1.7–7.7)
Platelets: 335 10*3/uL (ref 150–400)
RBC: 3.75 MIL/uL — AB (ref 3.87–5.11)
RDW: 16.9 % — ABNORMAL HIGH (ref 11.5–15.5)
WBC: 11.1 10*3/uL — ABNORMAL HIGH (ref 4.0–10.5)

## 2016-05-02 LAB — COMPREHENSIVE METABOLIC PANEL
ALBUMIN: 3.8 g/dL (ref 3.5–5.0)
ALK PHOS: 44 U/L (ref 38–126)
ALT: 14 U/L (ref 14–54)
AST: 25 U/L (ref 15–41)
Anion gap: 7 (ref 5–15)
BILIRUBIN TOTAL: 0.5 mg/dL (ref 0.3–1.2)
BUN: 10 mg/dL (ref 6–20)
CALCIUM: 9.1 mg/dL (ref 8.9–10.3)
CO2: 23 mmol/L (ref 22–32)
Chloride: 108 mmol/L (ref 101–111)
Creatinine, Ser: 0.39 mg/dL — ABNORMAL LOW (ref 0.44–1.00)
GFR calc Af Amer: >60 mL/min (ref >60)
GFR calc non Af Amer: >60 mL/min (ref >60)
GLUCOSE: 80 mg/dL (ref 65–99)
Potassium: 3.4 mmol/L — ABNORMAL LOW (ref 3.5–5.1)
Sodium: 138 mmol/L (ref 135–145)
TOTAL PROTEIN: 7.7 g/dL (ref 6.5–8.1)

## 2016-05-02 LAB — T4, FREE: FREE T4: 0.89 ng/dL (ref 0.61–1.12)

## 2016-05-02 LAB — BRAIN NATRIURETIC PEPTIDE: B Natriuretic Peptide: 131.6 pg/mL — ABNORMAL HIGH (ref 0.0–100.0)

## 2016-05-02 LAB — I-STAT BETA HCG BLOOD, ED (MC, WL, AP ONLY)

## 2016-05-02 LAB — D-DIMER, QUANTITATIVE: D-Dimer, Quant: 1.76 ug/mL-FEU — ABNORMAL HIGH (ref 0.00–0.50)

## 2016-05-02 LAB — TSH: TSH: 2.098 u[IU]/mL (ref 0.350–4.500)

## 2016-05-02 MED ORDER — FUROSEMIDE 20 MG PO TABS: 20.0000 mg | ORAL_TABLET | Freq: Every day | ORAL | 0 refills | Status: AC

## 2016-05-02 MED ORDER — IOPAMIDOL (ISOVUE-370) INJECTION 76%
INTRAVENOUS | Status: AC
  Administered 2016-05-02: 68 mL
  Filled 2016-05-02: qty 100

## 2016-05-02 NOTE — ED Notes (Signed)
O2 not picking up with finger probe or ear probe

## 2016-05-02 NOTE — ED Notes (Signed)
Unsucessful IV attempt x2.

## 2016-05-02 NOTE — ED Notes (Signed)
[place] and supplies at bedside for MD to attempt [place] IV.

## 2016-05-02 NOTE — ED Notes (Addendum)
Name called to lobby. Patient is in restroom.

## 2016-05-02 NOTE — ED Notes (Signed)
Patient transported to CT 

## 2016-05-02 NOTE — Discharge Instructions (Signed)
You have been scheduled for an Outpatient Vascular Study at [facility].    If tomorrow is a Saturday or Sunday, please go to the [facility] Emergency Department Registration Desk at 8 am tomorrow morning and tell them you are there for a vascular study.  If tomorrow is a weekday (Monday-Friday), please go to [facility] Admitting Department at 8 am and tell them you are  there for a vascular study.   Please call your PCP for close follow-up. You will need an ultrasound of the heart for further work-up. Please return for worsening symptoms, including difficulty breathing, passing out, or any other symptoms concerning to you.

## 2016-05-02 NOTE — ED Provider Notes (Signed)
[facility] DEPT Provider Note   CSN: [id number] Arrival date & time: 05/02/16  1257     History   Chief Complaint Chief Complaint  Patient presents with  . Leg Swelling  . Shortness of Breath    HPI Casey Chapman is a 31 y.o. female.  HPI 31 year old female who presents with right greater than left LE swelling. History of autoimmune hemolytic anemia s/p splenectomy. States longstanding history of intermittent LE edema, but more noticeable this week. It is worse at the end of the day. 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 2 weeks ago drove to [place], but stopped multiple times to rest. Has had mild shortness of breath sometimes when going up steps or walking for long periods. Mild cough, but no fever. No chest pain, syncope or near syncope. Urinating normally. No abdominal pain, n/v/d. No melena, hematochezia, heavy vaginal bleeding. No orthopnea or PND.   Past Medical History:  Diagnosis Date  . Chlamydia 04/16/12   Treated   . Eczema   . Hemolytic anemia ([place]) 2012   Autoimmune     Patient Active Problem List   Diagnosis Date Noted  . Lymphadenopathy 05/15/2014  . Hemolytic anemia ([place])   . Eczema   . Chlamydia 04/16/2012    Past Surgical History:  Procedure Laterality Date  . SPLENECTOMY  2012   . TONSILLECTOMY  1990    OB History    Gravida Para Term Preterm AB Living   0 0 0 0 0 0   SAB TAB Ectopic Multiple Live Births   0 0 0 0         Home Medications    Prior to Admission medications   Medication Sig Start Date End Date Taking? Authorizing Provider  ibuprofen (ADVIL,MOTRIN) 200 MG tablet Take 400 mg by mouth every 6 (six) hours as needed for moderate pain.   Yes Historical Provider, MD  Multiple Vitamins-Minerals (MULTIVITAMIN PO) Take 1 tablet by mouth daily.    Yes Historical Provider, MD  pimecrolimus (ELIDEL) 1 % cream Apply 1 application topically daily.   Yes Historical Provider,  MD  TRIAMCINOLONE ACETONIDE, TOP, (TRIANEX) 0.05 % OINT Apply 1 application topically daily.   Yes Historical Provider, MD  acetaminophen (TYLENOL) 500 MG tablet Take 500 mg by mouth every 6 (six) hours as needed. pain    Historical Provider, MD  furosemide (LASIX) 20 MG tablet Take 1 tablet (20 mg total) by mouth daily. 05/02/16   Forde Dandy, MD  hydrocortisone cream 0.5 % Apply 1 application topically daily as needed. Apply to arms and legs as needed    Historical Provider, MD    Family History Family History  Problem Relation Age of Onset  . Diabetes Mother   . Hypertension Mother   . Depression Mother   . Diabetes Brother   . Diabetes Maternal Grandmother   . Diabetes Paternal Grandmother   . Hypertension Paternal Grandmother   . Thyroid disease Father     Thyroid removed    Social History Social History  Substance Use Topics  . Smoking status: Never Smoker  . Smokeless tobacco: Never Used  . Alcohol use 0.0 oz/week     Comment: Socially     Allergies   Patient has no known allergies.   Review of Systems Review of Systems 10/14 systems reviewed and are negative other than those stated in the HPI   Physical Exam  Updated Vital Signs BP (!) 171/74 (BP Location: Right Arm)   Pulse 72   Temp 97.9 F (36.6 C) (Oral)   Resp 18   Ht 5\' 6"  (1.676 m)   Wt 139 lb (63 kg)   LMP 04/27/2016   SpO2 94%   BMI 22.44 kg/m   Physical Exam Physical Exam  Nursing note and vitals reviewed. Constitutional: Well developed, well nourished, non-toxic, and in no acute distress Head: Normocephalic and atraumatic.  Mouth/Throat: Oropharynx is clear and moist.  Neck: Normal range of motion. Neck supple.  Cardiovascular: Normal rate and regular rhythm.   Pulmonary/Chest: Effort normal and breath sounds normal.  Abdominal: Soft. There is no tenderness. There is no rebound and no guarding.  Musculoskeletal: Trace bilateral LE edema, right > left Neurological: Alert, no facial  droop, fluent speech, moves all extremities symmetrically Skin: Skin is warm and dry.  Psychiatric: Cooperative   ED Treatments / Results  Labs (all labs ordered are listed, but only abnormal results are displayed) Labs Reviewed  CBC WITH DIFFERENTIAL/PLATELET - Abnormal; Notable for the following:       Result Value   WBC 11.1 (*)    RBC 3.75 (*)    Hemoglobin 11.0 (*)    HCT 34.3 (*)    RDW 16.9 (*)    Lymphs Abs 4.2 (*)    Monocytes Absolute 1.7 (*)    All other components within normal limits  COMPREHENSIVE METABOLIC PANEL - Abnormal; Notable for the following:    Potassium 3.4 (*)    Creatinine, Ser 0.39 (*)    All other components within normal limits  BRAIN NATRIURETIC PEPTIDE - Abnormal; Notable for the following:    B Natriuretic Peptide 131.6 (*)    All other components within normal limits  D-DIMER, QUANTITATIVE (NOT AT [facility]) - Abnormal; Notable for the following:    D-Dimer, Quant 1.76 (*)    All other components within normal limits  TSH  T4, FREE  I-STAT TROPOININ, ED  I-STAT BETA HCG BLOOD, ED ([facility], [facility], [facility] ONLY)    EKG  EKG Interpretation  Date/Time:  Wednesday May 02 2016 13:20:04 EDT Ventricular Rate:  65 PR Interval:    QRS Duration: 91 QT Interval:  396 QTC Calculation: 412 R Axis:   -177 Text Interpretation:  Sinus arrhythmia Anterior infarct, old no acute changes  Confirmed by Coston Mandato MD, Eddie Payette [phone]) on 05/02/2016 5:54:10 PM       Radiology Dg Chest 2 View  Result Date: 05/02/2016 CLINICAL DATA:  Right ankle and foot injury and shortness of breath. EXAM: CHEST  2 VIEW COMPARISON:  Chest x-ray a 01/06/2010 and chest CT 12/09/2015 FINDINGS: The heart is upper limits of normal in size but stable. The mediastinal and hilar contours are normal. The lungs are clear. No pleural effusion. The bony thorax is intact. IMPRESSION: No acute cardiopulmonary findings.  Stable borderline heart size. Electronically Signed   By: Marijo Sanes M.D.   On: 05/02/2016  13:32   Ct Angio Chest Pe W And/or Wo Contrast  Result Date: 05/02/2016 CLINICAL DATA:  Right greater than left lower extremity swelling. Mild dyspnea. Autoimmune hemolytic anemia status post splenectomy. EXAM: CT ANGIOGRAPHY CHEST WITH CONTRAST TECHNIQUE: Multidetector CT imaging of the chest was performed using the standard protocol during bolus administration of intravenous contrast. Multiplanar CT image reconstructions and MIPs were obtained to evaluate the vascular anatomy. CONTRAST:  68 cc Isovue 370 COMPARISON:  11/12/2014 chest CT FINDINGS: Cardiovascular: Mild cardiomegaly with trace posterior  and right-sided pericardial effusion at 8 mm in thickness. No acute pulmonary embolus, aortic aneurysm or dissection. Mediastinum/Nodes: Small hiatal hernia with fluid-filled distal esophagus possibly related to reflux. Small prevascular lymph nodes measuring up to 0.8 cm are noted. Retroclavicular, subpectoral and bilateral axillary lymphadenopathy is identified measuring up to 11 mm short axis. Trachea and mainstem bronchi are patent. Stable thyroid gland with slight retroclavicular extension of the left globe. Lungs/Pleura: Scattered pulmonary blebs and bulla. No pneumonic consolidation, dominant mass, effusion or pneumothorax. Upper Abdomen: No acute abnormality. Musculoskeletal: Mild subcutaneous edema suspicious for anasarca. Review of the MIP images confirms the above findings. IMPRESSION: 1. Cardiomegaly with trace pericardial effusion for age. There is mild diffuse anasarca. 2. Nonspecific retroclavicular, subpectoral, and bilateral axillary lymphadenopathy. Findings have been chronic unstable back to 2016 CT. 3. No acute pulmonary embolus. 4. Small hiatal hernia with fluid in the distal esophagus likely related to reflux. Electronically Signed   By: Ashley Royalty M.D.   On: 05/02/2016 20:53    Procedures Procedures (including critical care time)  Medications Ordered in ED Medications  iopamidol  (ISOVUE-370) 76 % injection (68 mLs  Contrast Given 05/02/16 2025)     Initial Impression / Assessment and Plan / ED Course  I have reviewed the triage vital signs and the nursing notes.  Pertinent labs & imaging results that were available during my care of the patient were reviewed by me and considered in my medical decision making (see chart for details).     Presents with right > left LE edema. Well appearing and in no acute distress. Normal vitals and breathing comfortably. With edema on exam, but no other symptoms of CHF. Mildly elevated BNP. CXR visualized and w/o edema, pneumonia or other acute cardiopulmonary processes.DDimer is elevaetd and she underwent CT PE. This shows no PE or other acute cardiopulmonary processes. Potential soft tissue edema noted on CT. Normal renal function and LFTs. Normal thyroid studies. Remainder of blood work unremarkable. Discussed she will need additional work-up of her edema, such as ECHO. Discussed PCP follow-up and cardiology referral provided. 5 days lasix and supportive care (I.e feet elevation, compression stockings) discussed. Strict return and follow-up instructions reviewed. She expressed understanding of all discharge instructions and felt comfortable with the plan of care.   Final Clinical Impressions(s) / ED Diagnoses   Final diagnoses:  Peripheral edema    New Prescriptions Discharge Medication List as of 05/02/2016 10:30 PM    START taking these medications   Details  furosemide (LASIX) 20 MG tablet Take 1 tablet (20 mg total) by mouth daily., Starting Wed 05/02/2016, Print         Forde Dandy, MD 05/03/16 [phone]

## 2016-05-02 NOTE — ED Triage Notes (Signed)
Patient reports swelling to right ankle and right foot since Monday. Denies chest pain. Reports intermittent shortness of breath; SOB worse with exertion. Denies injury to the area of swelling.

## 2016-05-02 NOTE — ED Notes (Signed)
Patient transported to X-ray 

## 2016-05-03 ENCOUNTER — Ambulatory Visit (HOSPITAL_COMMUNITY)
Admission: RE | Admit: 2016-05-03 | Discharge: 2016-05-03 | Disposition: A | Discharge status: Home / Self Care | Payer: 59 | Source: Ambulatory Visit | Attending: Emergency Medicine | Admitting: Emergency Medicine

## 2016-05-03 DIAGNOSIS — R600 Localized edema: Secondary | ICD-10-CM | POA: Diagnosis not present

## 2016-05-03 DIAGNOSIS — M7989 Other specified soft tissue disorders: Secondary | ICD-10-CM | POA: Diagnosis not present

## 2016-05-03 NOTE — Progress Notes (Signed)
**  Preliminary report by tech**  Right lower extremity venous duplex complete. There is no evidence of deep or superficial vein thrombosis involving the right lower extremity. All visualized vessels appear patent and compressible. There is no evidence of a Baker's cyst on the right. Incidental findings are consistent with: multiple enlarged lymph nodes the largest of which measures 1 cm high by 1.8 cm wide by 2.9 cm long on the right.  05/03/16 9:07 AM Casey Chapman RVT

## 2016-06-04 ENCOUNTER — Other Ambulatory Visit: Payer: Self-pay | Admitting: Cardiology

## 2016-06-04 DIAGNOSIS — I872 Venous insufficiency (chronic) (peripheral): Secondary | ICD-10-CM

## 2016-06-04 DIAGNOSIS — R609 Edema, unspecified: Secondary | ICD-10-CM

## 2016-08-12 DIAGNOSIS — R739 Hyperglycemia, unspecified: Secondary | ICD-10-CM | POA: Diagnosis not present

## 2016-08-12 DIAGNOSIS — G40901 Epilepsy, unspecified, not intractable, with status epilepticus: Secondary | ICD-10-CM | POA: Diagnosis present

## 2016-08-12 DIAGNOSIS — I110 Hypertensive heart disease with heart failure: Principal | ICD-10-CM | POA: Diagnosis present

## 2016-08-12 DIAGNOSIS — R6521 Severe sepsis with septic shock: Secondary | ICD-10-CM

## 2016-08-12 DIAGNOSIS — G931 Anoxic brain damage, not elsewhere classified: Secondary | ICD-10-CM | POA: Diagnosis present

## 2016-08-12 DIAGNOSIS — F1210 Cannabis abuse, uncomplicated: Secondary | ICD-10-CM | POA: Diagnosis present

## 2016-08-12 DIAGNOSIS — K7200 Acute and subacute hepatic failure without coma: Secondary | ICD-10-CM | POA: Diagnosis present

## 2016-08-12 DIAGNOSIS — Z833 Family history of diabetes mellitus: Secondary | ICD-10-CM

## 2016-08-12 DIAGNOSIS — I471 Supraventricular tachycardia: Secondary | ICD-10-CM | POA: Diagnosis not present

## 2016-08-12 DIAGNOSIS — D591 Other autoimmune hemolytic anemias: Secondary | ICD-10-CM | POA: Diagnosis present

## 2016-08-12 DIAGNOSIS — E875 Hyperkalemia: Secondary | ICD-10-CM | POA: Diagnosis not present

## 2016-08-12 DIAGNOSIS — I5021 Acute systolic (congestive) heart failure: Secondary | ICD-10-CM | POA: Diagnosis present

## 2016-08-12 DIAGNOSIS — E162 Hypoglycemia, unspecified: Secondary | ICD-10-CM | POA: Diagnosis present

## 2016-08-12 DIAGNOSIS — I309 Acute pericarditis, unspecified: Secondary | ICD-10-CM | POA: Diagnosis present

## 2016-08-12 DIAGNOSIS — A419 Sepsis, unspecified organism: Secondary | ICD-10-CM

## 2016-08-12 DIAGNOSIS — R4182 Altered mental status, unspecified: Secondary | ICD-10-CM | POA: Diagnosis not present

## 2016-08-12 DIAGNOSIS — Z79899 Other long term (current) drug therapy: Secondary | ICD-10-CM

## 2016-08-12 DIAGNOSIS — I160 Hypertensive urgency: Secondary | ICD-10-CM | POA: Diagnosis present

## 2016-08-12 DIAGNOSIS — R34 Anuria and oliguria: Secondary | ICD-10-CM | POA: Diagnosis present

## 2016-08-12 DIAGNOSIS — D65 Disseminated intravascular coagulation [defibrination syndrome]: Secondary | ICD-10-CM | POA: Diagnosis present

## 2016-08-12 DIAGNOSIS — Z515 Encounter for palliative care: Secondary | ICD-10-CM | POA: Diagnosis present

## 2016-08-12 DIAGNOSIS — Z9081 Acquired absence of spleen: Secondary | ICD-10-CM

## 2016-08-12 DIAGNOSIS — I161 Hypertensive emergency: Secondary | ICD-10-CM | POA: Diagnosis present

## 2016-08-12 DIAGNOSIS — N179 Acute kidney failure, unspecified: Secondary | ICD-10-CM | POA: Diagnosis present

## 2016-08-12 DIAGNOSIS — I43 Cardiomyopathy in diseases classified elsewhere: Secondary | ICD-10-CM | POA: Diagnosis present

## 2016-08-12 DIAGNOSIS — D62 Acute posthemorrhagic anemia: Secondary | ICD-10-CM | POA: Diagnosis present

## 2016-08-12 DIAGNOSIS — E876 Hypokalemia: Secondary | ICD-10-CM | POA: Diagnosis present

## 2016-08-12 DIAGNOSIS — I96 Gangrene, not elsewhere classified: Secondary | ICD-10-CM | POA: Diagnosis present

## 2016-08-12 DIAGNOSIS — E872 Acidosis: Secondary | ICD-10-CM | POA: Diagnosis present

## 2016-08-12 DIAGNOSIS — R570 Cardiogenic shock: Secondary | ICD-10-CM | POA: Diagnosis present

## 2016-08-12 DIAGNOSIS — I272 Pulmonary hypertension, unspecified: Secondary | ICD-10-CM | POA: Diagnosis present

## 2016-08-12 DIAGNOSIS — J9601 Acute respiratory failure with hypoxia: Secondary | ICD-10-CM | POA: Diagnosis present

## 2016-08-12 DIAGNOSIS — Z6824 Body mass index (BMI) 24.0-24.9, adult: Secondary | ICD-10-CM

## 2016-08-12 DIAGNOSIS — K921 Melena: Secondary | ICD-10-CM | POA: Diagnosis not present

## 2016-08-12 DIAGNOSIS — Z8249 Family history of ischemic heart disease and other diseases of the circulatory system: Secondary | ICD-10-CM

## 2016-08-12 DIAGNOSIS — Z66 Do not resuscitate: Secondary | ICD-10-CM | POA: Diagnosis present

## 2016-08-13 ENCOUNTER — Inpatient Hospital Stay (HOSPITAL_COMMUNITY)
Admission: EM | Admit: 2016-08-13 | Discharge: 2016-09-05 | DRG: 291 | Disposition: E | Payer: 59 | Attending: Internal Medicine | Admitting: Internal Medicine

## 2016-08-13 ENCOUNTER — Emergency Department (HOSPITAL_COMMUNITY): Payer: 59

## 2016-08-13 ENCOUNTER — Inpatient Hospital Stay (HOSPITAL_COMMUNITY): Payer: 59

## 2016-08-13 ENCOUNTER — Other Ambulatory Visit: Payer: Self-pay

## 2016-08-13 ENCOUNTER — Encounter (HOSPITAL_COMMUNITY): Payer: Self-pay | Admitting: Emergency Medicine

## 2016-08-13 DIAGNOSIS — N179 Acute kidney failure, unspecified: Secondary | ICD-10-CM

## 2016-08-13 DIAGNOSIS — I43 Cardiomyopathy in diseases classified elsewhere: Secondary | ICD-10-CM | POA: Diagnosis not present

## 2016-08-13 DIAGNOSIS — I309 Acute pericarditis, unspecified: Secondary | ICD-10-CM | POA: Diagnosis present

## 2016-08-13 DIAGNOSIS — R599 Enlarged lymph nodes, unspecified: Secondary | ICD-10-CM | POA: Diagnosis not present

## 2016-08-13 DIAGNOSIS — Z9081 Acquired absence of spleen: Secondary | ICD-10-CM | POA: Diagnosis not present

## 2016-08-13 DIAGNOSIS — I161 Hypertensive emergency: Secondary | ICD-10-CM | POA: Diagnosis present

## 2016-08-13 DIAGNOSIS — B3324 Viral cardiomyopathy: Secondary | ICD-10-CM | POA: Diagnosis not present

## 2016-08-13 DIAGNOSIS — E162 Hypoglycemia, unspecified: Secondary | ICD-10-CM | POA: Diagnosis present

## 2016-08-13 DIAGNOSIS — R34 Anuria and oliguria: Secondary | ICD-10-CM | POA: Diagnosis not present

## 2016-08-13 DIAGNOSIS — D649 Anemia, unspecified: Secondary | ICD-10-CM | POA: Diagnosis not present

## 2016-08-13 DIAGNOSIS — J9600 Acute respiratory failure, unspecified whether with hypoxia or hypercapnia: Secondary | ICD-10-CM

## 2016-08-13 DIAGNOSIS — R570 Cardiogenic shock: Secondary | ICD-10-CM | POA: Diagnosis present

## 2016-08-13 DIAGNOSIS — J9601 Acute respiratory failure with hypoxia: Secondary | ICD-10-CM

## 2016-08-13 DIAGNOSIS — I471 Supraventricular tachycardia: Secondary | ICD-10-CM | POA: Diagnosis not present

## 2016-08-13 DIAGNOSIS — Z515 Encounter for palliative care: Secondary | ICD-10-CM

## 2016-08-13 DIAGNOSIS — R579 Shock, unspecified: Secondary | ICD-10-CM | POA: Diagnosis not present

## 2016-08-13 DIAGNOSIS — D589 Hereditary hemolytic anemia, unspecified: Secondary | ICD-10-CM | POA: Diagnosis not present

## 2016-08-13 DIAGNOSIS — Z01818 Encounter for other preprocedural examination: Secondary | ICD-10-CM

## 2016-08-13 DIAGNOSIS — Z452 Encounter for adjustment and management of vascular access device: Secondary | ICD-10-CM

## 2016-08-13 DIAGNOSIS — A419 Sepsis, unspecified organism: Secondary | ICD-10-CM | POA: Diagnosis not present

## 2016-08-13 DIAGNOSIS — K921 Melena: Secondary | ICD-10-CM | POA: Diagnosis not present

## 2016-08-13 DIAGNOSIS — D591 Other autoimmune hemolytic anemias: Secondary | ICD-10-CM | POA: Diagnosis present

## 2016-08-13 DIAGNOSIS — D65 Disseminated intravascular coagulation [defibrination syndrome]: Secondary | ICD-10-CM | POA: Diagnosis present

## 2016-08-13 DIAGNOSIS — I10 Essential (primary) hypertension: Secondary | ICD-10-CM | POA: Diagnosis not present

## 2016-08-13 DIAGNOSIS — I160 Hypertensive urgency: Secondary | ICD-10-CM

## 2016-08-13 DIAGNOSIS — Z9289 Personal history of other medical treatment: Secondary | ICD-10-CM

## 2016-08-13 DIAGNOSIS — I313 Pericardial effusion (noninflammatory): Secondary | ICD-10-CM | POA: Diagnosis not present

## 2016-08-13 DIAGNOSIS — I351 Nonrheumatic aortic (valve) insufficiency: Secondary | ICD-10-CM | POA: Diagnosis not present

## 2016-08-13 DIAGNOSIS — G931 Anoxic brain damage, not elsewhere classified: Secondary | ICD-10-CM

## 2016-08-13 DIAGNOSIS — I5021 Acute systolic (congestive) heart failure: Secondary | ICD-10-CM | POA: Diagnosis present

## 2016-08-13 DIAGNOSIS — Z992 Dependence on renal dialysis: Secondary | ICD-10-CM

## 2016-08-13 DIAGNOSIS — Z8249 Family history of ischemic heart disease and other diseases of the circulatory system: Secondary | ICD-10-CM | POA: Diagnosis not present

## 2016-08-13 DIAGNOSIS — Z833 Family history of diabetes mellitus: Secondary | ICD-10-CM | POA: Diagnosis not present

## 2016-08-13 DIAGNOSIS — D62 Acute posthemorrhagic anemia: Secondary | ICD-10-CM | POA: Diagnosis not present

## 2016-08-13 DIAGNOSIS — R569 Unspecified convulsions: Secondary | ICD-10-CM

## 2016-08-13 DIAGNOSIS — L943 Sclerodactyly: Secondary | ICD-10-CM

## 2016-08-13 DIAGNOSIS — I517 Cardiomegaly: Secondary | ICD-10-CM

## 2016-08-13 DIAGNOSIS — R4182 Altered mental status, unspecified: Secondary | ICD-10-CM | POA: Diagnosis present

## 2016-08-13 DIAGNOSIS — R6521 Severe sepsis with septic shock: Secondary | ICD-10-CM | POA: Diagnosis not present

## 2016-08-13 DIAGNOSIS — I96 Gangrene, not elsewhere classified: Secondary | ICD-10-CM | POA: Diagnosis present

## 2016-08-13 DIAGNOSIS — I469 Cardiac arrest, cause unspecified: Secondary | ICD-10-CM | POA: Diagnosis not present

## 2016-08-13 DIAGNOSIS — E872 Acidosis: Secondary | ICD-10-CM | POA: Diagnosis present

## 2016-08-13 DIAGNOSIS — K7201 Acute and subacute hepatic failure with coma: Secondary | ICD-10-CM | POA: Diagnosis not present

## 2016-08-13 DIAGNOSIS — I429 Cardiomyopathy, unspecified: Secondary | ICD-10-CM | POA: Diagnosis not present

## 2016-08-13 DIAGNOSIS — I110 Hypertensive heart disease with heart failure: Secondary | ICD-10-CM | POA: Diagnosis present

## 2016-08-13 DIAGNOSIS — D696 Thrombocytopenia, unspecified: Secondary | ICD-10-CM | POA: Diagnosis not present

## 2016-08-13 DIAGNOSIS — K7200 Acute and subacute hepatic failure without coma: Secondary | ICD-10-CM | POA: Diagnosis present

## 2016-08-13 LAB — BLOOD GAS, VENOUS
ACID-BASE DEFICIT: 5 mmol/L — AB (ref 0.0–2.0)
Bicarbonate: 19.6 mmol/L — ABNORMAL LOW (ref 20.0–28.0)
O2 Saturation: 51.6 %
PH VEN: 7.303 (ref 7.250–7.430)
PO2 VEN: 41.5 mmHg (ref 32.0–45.0)
Patient temperature: 104
pCO2, Ven: 42.5 mmHg — ABNORMAL LOW (ref 44.0–60.0)

## 2016-08-13 LAB — BLOOD GAS, ARTERIAL
ACID-BASE DEFICIT: 20.7 mmol/L — AB (ref 0.0–2.0)
ACID-BASE DEFICIT: 4.2 mmol/L — AB (ref 0.0–2.0)
Acid-base deficit: 16.1 mmol/L — ABNORMAL HIGH (ref 0.0–2.0)
BICARBONATE: 7.3 mmol/L — AB (ref 20.0–28.0)
Bicarbonate: 10.1 mmol/L — ABNORMAL LOW (ref 20.0–28.0)
Bicarbonate: 20.3 mmol/L (ref 20.0–28.0)
DRAWN BY: 42624
Drawn by: 308601
Drawn by: 308601
FIO2: 100
FIO2: 50
FIO2: 100
MECHVT: 470 mL
MECHVT: 470 mL
O2 SAT: 99.3 %
O2 SAT: 99.5 %
O2 Saturation: 98.9 %
PATIENT TEMPERATURE: 98.6
PATIENT TEMPERATURE: 98.6
PCO2 ART: 24.2 mmHg — AB (ref 32.0–48.0)
PCO2 ART: 36.8 mmHg (ref 32.0–48.0)
PEEP/CPAP: 5 cmH2O
PEEP/CPAP: 8 cmH2O
PEEP: 5 cmH2O
PH ART: 7.107 — AB (ref 7.350–7.450)
PH ART: 7.361 (ref 7.350–7.450)
PO2 ART: 178 mmHg — AB (ref 83.0–108.0)
Patient temperature: 98.6
RATE: 15 resp/min
RATE: 15 resp/min
RATE: 35 resp/min
VT: 470 mL
pCO2 arterial: 25.7 mmHg — ABNORMAL LOW (ref 32.0–48.0)
pH, Arterial: 7.219 — ABNORMAL LOW (ref 7.350–7.450)
pO2, Arterial: 464 mmHg — ABNORMAL HIGH (ref 83.0–108.0)
pO2, Arterial: 207 mmHg — ABNORMAL HIGH (ref 83.0–108.0)

## 2016-08-13 LAB — BASIC METABOLIC PANEL
ANION GAP: 19 — AB (ref 5–15)
Anion gap: 20 — ABNORMAL HIGH (ref 5–15)
Anion gap: 21 — ABNORMAL HIGH (ref 5–15)
Anion gap: 21 — ABNORMAL HIGH (ref 5–15)
Anion gap: 23 — ABNORMAL HIGH (ref 5–15)
Anion gap: 9 (ref 5–15)
BUN: 19 mg/dL (ref 6–20)
BUN: 26 mg/dL — AB (ref 6–20)
BUN: 30 mg/dL — ABNORMAL HIGH (ref 6–20)
BUN: 36 mg/dL — AB (ref 6–20)
BUN: 38 mg/dL — ABNORMAL HIGH (ref 6–20)
BUN: 40 mg/dL — AB (ref 6–20)
CALCIUM: 8.2 mg/dL — AB (ref 8.9–10.3)
CALCIUM: 9.3 mg/dL (ref 8.9–10.3)
CHLORIDE: 100 mmol/L — AB (ref 101–111)
CHLORIDE: 103 mmol/L (ref 101–111)
CHLORIDE: 95 mmol/L — AB (ref 101–111)
CHLORIDE: 97 mmol/L — AB (ref 101–111)
CHLORIDE: 99 mmol/L — AB (ref 101–111)
CO2: 13 mmol/L — ABNORMAL LOW (ref 22–32)
CO2: 15 mmol/L — AB (ref 22–32)
CO2: 19 mmol/L — ABNORMAL LOW (ref 22–32)
CO2: 20 mmol/L — ABNORMAL LOW (ref 22–32)
CO2: 20 mmol/L — ABNORMAL LOW (ref 22–32)
CO2: 23 mmol/L (ref 22–32)
CREATININE: 1.46 mg/dL — AB (ref 0.44–1.00)
CREATININE: 2.59 mg/dL — AB (ref 0.44–1.00)
Calcium: 8 mg/dL — ABNORMAL LOW (ref 8.9–10.3)
Calcium: 7.7 mg/dL — ABNORMAL LOW (ref 8.9–10.3)
Calcium: 7.3 mg/dL — ABNORMAL LOW (ref 8.9–10.3)
Calcium: 7 mg/dL — ABNORMAL LOW (ref 8.9–10.3)
Chloride: 96 mmol/L — ABNORMAL LOW (ref 101–111)
Creatinine, Ser: 0.88 mg/dL (ref 0.44–1.00)
Creatinine, Ser: 1.77 mg/dL — ABNORMAL HIGH (ref 0.44–1.00)
Creatinine, Ser: 2.61 mg/dL — ABNORMAL HIGH (ref 0.44–1.00)
Creatinine, Ser: 2.77 mg/dL — ABNORMAL HIGH (ref 0.44–1.00)
GFR calc Af Amer: 27 mL/min — ABNORMAL LOW (ref >60)
GFR calc Af Amer: 25 mL/min — ABNORMAL LOW (ref >60)
GFR calc non Af Amer: 46 mL/min — ABNORMAL LOW (ref >60)
GFR calc non Af Amer: 37 mL/min — ABNORMAL LOW (ref >60)
GFR calc non Af Amer: 23 mL/min — ABNORMAL LOW (ref >60)
GFR calc non Af Amer: 23 mL/min — ABNORMAL LOW (ref >60)
GFR, EST AFRICAN AMERICAN: 54 mL/min — AB (ref >60)
GFR, EST AFRICAN AMERICAN: 43 mL/min — AB (ref >60)
GFR, EST AFRICAN AMERICAN: 27 mL/min — AB (ref >60)
GFR, EST NON AFRICAN AMERICAN: 21 mL/min — AB (ref >60)
GLUCOSE: 88 mg/dL (ref 65–99)
Glucose, Bld: 131 mg/dL — ABNORMAL HIGH (ref 65–99)
Glucose, Bld: 173 mg/dL — ABNORMAL HIGH (ref 65–99)
Glucose, Bld: 183 mg/dL — ABNORMAL HIGH (ref 65–99)
Glucose, Bld: 205 mg/dL — ABNORMAL HIGH (ref 65–99)
Glucose, Bld: 98 mg/dL (ref 65–99)
POTASSIUM: 5.6 mmol/L — AB (ref 3.5–5.1)
POTASSIUM: 3 mmol/L — AB (ref 3.5–5.1)
POTASSIUM: 3.4 mmol/L — AB (ref 3.5–5.1)
Potassium: 3.3 mmol/L — ABNORMAL LOW (ref 3.5–5.1)
Potassium: 3.3 mmol/L — ABNORMAL LOW (ref 3.5–5.1)
Potassium: 5.1 mmol/L (ref 3.5–5.1)
SODIUM: 135 mmol/L (ref 135–145)
SODIUM: 135 mmol/L (ref 135–145)
SODIUM: 136 mmol/L (ref 135–145)
SODIUM: 139 mmol/L (ref 135–145)
Sodium: 134 mmol/L — ABNORMAL LOW (ref 135–145)
Sodium: 134 mmol/L — ABNORMAL LOW (ref 135–145)

## 2016-08-13 LAB — RAPID URINE DRUG SCREEN, HOSP PERFORMED
AMPHETAMINES: NOT DETECTED
Amphetamines: NOT DETECTED
BENZODIAZEPINES: NOT DETECTED
BENZODIAZEPINES: NOT DETECTED
Barbiturates: NOT DETECTED
Barbiturates: NOT DETECTED
COCAINE: NOT DETECTED
Cocaine: NOT DETECTED
OPIATES: NOT DETECTED
OPIATES: NOT DETECTED
TETRAHYDROCANNABINOL: POSITIVE — AB
Tetrahydrocannabinol: POSITIVE — AB

## 2016-08-13 LAB — COMPREHENSIVE METABOLIC PANEL
ALBUMIN: 1.8 g/dL — AB (ref 3.5–5.0)
ALK PHOS: 58 U/L (ref 38–126)
ALT: 265 U/L — ABNORMAL HIGH (ref 14–54)
ANION GAP: 27 — AB (ref 5–15)
AST: 732 U/L — ABNORMAL HIGH (ref 15–41)
BILIRUBIN TOTAL: 0.6 mg/dL (ref 0.3–1.2)
BUN: 32 mg/dL — ABNORMAL HIGH (ref 6–20)
CALCIUM: 8.2 mg/dL — AB (ref 8.9–10.3)
CO2: 40 mmol/L — AB (ref 22–32)
Chloride: 92 mmol/L — ABNORMAL LOW (ref 101–111)
Creatinine, Ser: 2.23 mg/dL — ABNORMAL HIGH (ref 0.44–1.00)
GFR calc non Af Amer: 28 mL/min — ABNORMAL LOW (ref >60)
GFR, EST AFRICAN AMERICAN: 32 mL/min — AB (ref >60)
GLUCOSE: 133 mg/dL — AB (ref 65–99)
POTASSIUM: 5.4 mmol/L — AB (ref 3.5–5.1)
SODIUM: 159 mmol/L — AB (ref 135–145)
TOTAL PROTEIN: 3.9 g/dL — AB (ref 6.5–8.1)

## 2016-08-13 LAB — CBC WITH DIFFERENTIAL/PLATELET
BASOS ABS: 0.2 10*3/uL — AB (ref 0.0–0.1)
Basophils Relative: 1 %
EOS ABS: 0 10*3/uL (ref 0.0–0.7)
Eosinophils Relative: 0 %
HCT: 20 % — ABNORMAL LOW (ref 36.0–46.0)
Hemoglobin: 6.3 g/dL — CL (ref 12.0–15.0)
LYMPHS PCT: 27 %
Lymphs Abs: 5.9 10*3/uL — ABNORMAL HIGH (ref 0.7–4.0)
MCH: 28 pg (ref 26.0–34.0)
MCHC: 31.5 g/dL (ref 30.0–36.0)
MCV: 88.9 fL (ref 78.0–100.0)
MONOS PCT: 10 %
Monocytes Absolute: 2.2 10*3/uL — ABNORMAL HIGH (ref 0.1–1.0)
NEUTROS PCT: 62 %
Neutro Abs: 13.5 10*3/uL — ABNORMAL HIGH (ref 1.7–7.7)
PLATELETS: 34 10*3/uL — AB (ref 150–400)
RBC: 2.25 MIL/uL — ABNORMAL LOW (ref 3.87–5.11)
RDW: 20.9 % — AB (ref 11.5–15.5)
WBC: 21.8 10*3/uL — ABNORMAL HIGH (ref 4.0–10.5)

## 2016-08-13 LAB — CBC
HCT: 26.4 % — ABNORMAL LOW (ref 36.0–46.0)
HCT: 26.8 % — ABNORMAL LOW (ref 36.0–46.0)
HCT: 29.4 % — ABNORMAL LOW (ref 36.0–46.0)
Hemoglobin: 8.2 g/dL — ABNORMAL LOW (ref 12.0–15.0)
Hemoglobin: 8.6 g/dL — ABNORMAL LOW (ref 12.0–15.0)
Hemoglobin: 9.5 g/dL — ABNORMAL LOW (ref 12.0–15.0)
MCH: 28.3 pg (ref 26.0–34.0)
MCH: 28.4 pg (ref 26.0–34.0)
MCH: 29 pg (ref 26.0–34.0)
MCHC: 31.1 g/dL (ref 30.0–36.0)
MCHC: 32.1 g/dL (ref 30.0–36.0)
MCHC: 32.3 g/dL (ref 30.0–36.0)
MCV: 87.8 fL (ref 78.0–100.0)
MCV: 90.2 fL (ref 78.0–100.0)
MCV: 91 fL (ref 78.0–100.0)
PLATELETS: 120 10*3/uL — AB (ref 150–400)
PLATELETS: 64 10*3/uL — AB (ref 150–400)
Platelets: 40 10*3/uL — ABNORMAL LOW (ref 150–400)
RBC: 2.9 MIL/uL — AB (ref 3.87–5.11)
RBC: 2.97 MIL/uL — AB (ref 3.87–5.11)
RBC: 3.35 MIL/uL — AB (ref 3.87–5.11)
RDW: 20.1 % — ABNORMAL HIGH (ref 11.5–15.5)
RDW: 20.3 % — AB (ref 11.5–15.5)
RDW: 20.5 % — ABNORMAL HIGH (ref 11.5–15.5)
WBC: 13.6 10*3/uL — ABNORMAL HIGH (ref 4.0–10.5)
WBC: 13.8 10*3/uL — AB (ref 4.0–10.5)
WBC: 16.5 10*3/uL — AB (ref 4.0–10.5)

## 2016-08-13 LAB — POCT I-STAT 3, ART BLOOD GAS (G3+)
ACID-BASE DEFICIT: 8 mmol/L — AB (ref 0.0–2.0)
Acid-base deficit: 14 mmol/L — ABNORMAL HIGH (ref 0.0–2.0)
BICARBONATE: 15.6 mmol/L — AB (ref 20.0–28.0)
BICARBONATE: 22.2 mmol/L (ref 20.0–28.0)
O2 Saturation: 81 %
O2 Saturation: 94 %
PCO2 ART: 53.6 mmHg — AB (ref 32.0–48.0)
PH ART: 7.072 — AB (ref 7.350–7.450)
PO2 ART: 63 mmHg — AB (ref 83.0–108.0)
PO2 ART: 95 mmHg (ref 83.0–108.0)
Patient temperature: 97
TCO2: 17 mmol/L (ref 0–100)
TCO2: 25 mmol/L (ref 0–100)
pCO2 arterial: 76.6 mmHg (ref 32.0–48.0)
pH, Arterial: 7.064 — CL (ref 7.350–7.450)

## 2016-08-13 LAB — PREPARE RBC (CROSSMATCH)

## 2016-08-13 LAB — COOXEMETRY PANEL
CARBOXYHEMOGLOBIN: 1 % (ref 0.5–1.5)
CARBOXYHEMOGLOBIN: 1.3 % (ref 0.5–1.5)
METHEMOGLOBIN: 0.9 % (ref 0.0–1.5)
Methemoglobin: 1.5 % (ref 0.0–1.5)
O2 SAT: 50.7 %
O2 SAT: 60 %
TOTAL HEMOGLOBIN: 6.2 g/dL — AB (ref 12.0–16.0)
Total hemoglobin: 9.5 g/dL — ABNORMAL LOW (ref 12.0–16.0)

## 2016-08-13 LAB — POCT I-STAT TROPONIN I: Troponin i, poc: 0.16 ng/mL (ref 0.00–0.08)

## 2016-08-13 LAB — LACTIC ACID, PLASMA
LACTIC ACID, VENOUS: 11.5 mmol/L — AB (ref 0.5–1.9)
LACTIC ACID, VENOUS: 9 mmol/L — AB (ref 0.5–1.9)
LACTIC ACID, VENOUS: 17.1 mmol/L — AB (ref 0.5–1.9)
LACTIC ACID, VENOUS: 12.5 mmol/L — AB (ref 0.5–1.9)
Lactic Acid, Venous: 11.1 mmol/L (ref 0.5–1.9)

## 2016-08-13 LAB — URINALYSIS, ROUTINE W REFLEX MICROSCOPIC
BILIRUBIN URINE: NEGATIVE
Glucose, UA: 50 mg/dL — AB
KETONES UR: 5 mg/dL — AB
LEUKOCYTES UA: NEGATIVE
Nitrite: NEGATIVE
Specific Gravity, Urine: 1.019 (ref 1.005–1.030)
pH: 6 (ref 5.0–8.0)

## 2016-08-13 LAB — PHOSPHORUS
PHOSPHORUS: 11.2 mg/dL — AB (ref 2.5–4.6)
Phosphorus: 10.6 mg/dL — ABNORMAL HIGH (ref 2.5–4.6)
Phosphorus: 9.8 mg/dL — ABNORMAL HIGH (ref 2.5–4.6)

## 2016-08-13 LAB — VITAMIN B12: VITAMIN B 12: 591 pg/mL (ref 180–914)

## 2016-08-13 LAB — GLUCOSE, CAPILLARY
GLUCOSE-CAPILLARY: 83 mg/dL (ref 65–99)
GLUCOSE-CAPILLARY: 164 mg/dL — AB (ref 65–99)
Glucose-Capillary: 42 mg/dL — CL (ref 65–99)
Glucose-Capillary: 112 mg/dL — ABNORMAL HIGH (ref 65–99)

## 2016-08-13 LAB — MRSA PCR SCREENING
MRSA BY PCR: POSITIVE — AB
MRSA by PCR: NEGATIVE

## 2016-08-13 LAB — MAGNESIUM
MAGNESIUM: 2.3 mg/dL (ref 1.7–2.4)
MAGNESIUM: 1.9 mg/dL (ref 1.7–2.4)
Magnesium: 2.6 mg/dL — ABNORMAL HIGH (ref 1.7–2.4)
Magnesium: 2.4 mg/dL (ref 1.7–2.4)

## 2016-08-13 LAB — I-STAT BETA HCG BLOOD, ED (NOT ORDERABLE)

## 2016-08-13 LAB — TROPONIN I
Troponin I: 7.21 ng/mL (ref <0.03)
Troponin I: 42.94 ng/mL (ref <0.03)
Troponin I: >65 ng/mL (ref <0.03)
Troponin I: >65 ng/mL (ref <0.03)

## 2016-08-13 LAB — ECHOCARDIOGRAM COMPLETE: Height: 66 in

## 2016-08-13 LAB — CORTISOL: CORTISOL PLASMA: 19.8 ug/dL

## 2016-08-13 LAB — PROTIME-INR
INR: 2.9
Prothrombin Time: 31 seconds — ABNORMAL HIGH (ref 11.4–15.2)

## 2016-08-13 LAB — C-REACTIVE PROTEIN: CRP: 2.6 mg/dL — ABNORMAL HIGH (ref <1.0)

## 2016-08-13 LAB — SEDIMENTATION RATE: Sed Rate: 20 mm/hr (ref 0–22)

## 2016-08-13 LAB — TRIGLYCERIDES: TRIGLYCERIDES: 252 mg/dL — AB (ref <150)

## 2016-08-13 LAB — SALICYLATE LEVEL: Salicylate Lvl: <7 mg/dL (ref 2.8–30.0)

## 2016-08-13 LAB — HIV ANTIBODY (ROUTINE TESTING W REFLEX): HIV Screen 4th Generation wRfx: NONREACTIVE

## 2016-08-13 LAB — TSH: TSH: 6.435 u[IU]/mL — ABNORMAL HIGH (ref 0.350–4.500)

## 2016-08-13 LAB — PROCALCITONIN: Procalcitonin: 3.08 ng/mL

## 2016-08-13 LAB — ACETAMINOPHEN LEVEL

## 2016-08-13 LAB — PREALBUMIN: PREALBUMIN: 14.3 mg/dL — AB (ref 18–38)

## 2016-08-13 MED ORDER — POTASSIUM CHLORIDE 10 MEQ/50ML IV SOLN
10.0000 meq | INTRAVENOUS | Status: AC
  Administered 2016-08-13 (×2): 10 meq via INTRAVENOUS
  Filled 2016-08-13: qty 50

## 2016-08-13 MED ORDER — VANCOMYCIN HCL IN DEXTROSE 750-5 MG/150ML-% IV SOLN: 750.0000 mg | INTRAVENOUS | Status: DC

## 2016-08-13 MED ORDER — FENTANYL CITRATE (PF) 100 MCG/2ML IJ SOLN
50.0000 ug | Freq: Once | INTRAMUSCULAR | Status: AC
  Administered 2016-08-13: 50 ug via INTRAVENOUS
  Filled 2016-08-13: qty 2

## 2016-08-13 MED ORDER — DEXTROSE 5 % IV SOLN
0.0000 ug/min | INTRAVENOUS | Status: DC
  Administered 2016-08-16: 28 ug/min via INTRAVENOUS
  Administered 2016-08-18: 1 ug/min via INTRAVENOUS
  Filled 2016-08-13 (×6): qty 16

## 2016-08-13 MED ORDER — VITAL 1.5 CAL PO LIQD
1000.0000 mL | ORAL | Status: DC
  Filled 2016-08-13: qty 1000

## 2016-08-13 MED ORDER — ORAL CARE MOUTH RINSE: 15.0000 mL | Freq: Four times a day (QID) | OROMUCOSAL | Status: DC

## 2016-08-13 MED ORDER — MILRINONE LACTATE IN DEXTROSE 20-5 MG/100ML-% IV SOLN
0.3750 ug/kg/min | INTRAVENOUS | Status: DC
  Administered 2016-08-13: 0.25 ug/kg/min via INTRAVENOUS
  Administered 2016-08-14 (×2): 0.375 ug/kg/min via INTRAVENOUS
  Filled 2016-08-13 (×2): qty 100

## 2016-08-13 MED ORDER — EPINEPHRINE PF 1 MG/10ML IJ SOSY
PREFILLED_SYRINGE | INTRAMUSCULAR | Status: AC
  Administered 2016-08-13: 16:00:00
  Filled 2016-08-13: qty 10

## 2016-08-13 MED ORDER — SODIUM BICARBONATE 8.4 % IV SOLN
50.0000 meq | Freq: Once | INTRAVENOUS | Status: AC
  Administered 2016-08-13: 50 meq via INTRAVENOUS

## 2016-08-13 MED ORDER — DEXTROSE 5 % IV SOLN
1.0000 g | INTRAVENOUS | Status: DC
  Administered 2016-08-14 – 2016-08-15 (×2): 1 g via INTRAVENOUS
  Filled 2016-08-13 (×2): qty 1

## 2016-08-13 MED ORDER — MAGNESIUM SULFATE 2 GM/50ML IV SOLN
INTRAVENOUS | Status: AC
  Filled 2016-08-13: qty 50

## 2016-08-13 MED ORDER — ATROPINE SULFATE 1 MG/10ML IJ SOSY
PREFILLED_SYRINGE | INTRAMUSCULAR | Status: AC
  Filled 2016-08-13: qty 10

## 2016-08-13 MED ORDER — IOPAMIDOL (ISOVUE-370) INJECTION 76%
100.0000 mL | Freq: Once | INTRAVENOUS | Status: AC | PRN
  Administered 2016-08-13: 100 mL via INTRAVENOUS

## 2016-08-13 MED ORDER — VITAL 1.5 CAL PO LIQD
1000.0000 mL | ORAL | Status: DC
  Filled 2016-08-13 (×3): qty 1000

## 2016-08-13 MED ORDER — IOPAMIDOL (ISOVUE-370) INJECTION 76%
INTRAVENOUS | Status: AC
  Administered 2016-08-13: 100 mL via INTRAVENOUS
  Filled 2016-08-13: qty 100

## 2016-08-13 MED ORDER — NOREPINEPHRINE BITARTRATE 1 MG/ML IV SOLN
0.0000 ug/min | INTRAVENOUS | Status: DC
  Administered 2016-08-13: 0 ug/min via INTRAVENOUS
  Administered 2016-08-13: 2 ug/min via INTRAVENOUS
  Filled 2016-08-13: qty 4

## 2016-08-13 MED ORDER — CHLORHEXIDINE GLUCONATE 0.12% ORAL RINSE (MEDLINE KIT)
15.0000 mL | Freq: Two times a day (BID) | OROMUCOSAL | Status: DC
  Administered 2016-08-13 – 2016-08-19 (×13): 15 mL via OROMUCOSAL

## 2016-08-13 MED ORDER — ORAL CARE MOUTH RINSE
15.0000 mL | Freq: Four times a day (QID) | OROMUCOSAL | Status: DC
  Administered 2016-08-13 – 2016-08-14 (×5): 15 mL via OROMUCOSAL

## 2016-08-13 MED ORDER — CHLORHEXIDINE GLUCONATE CLOTH 2 % EX PADS
6.0000 | MEDICATED_PAD | Freq: Every day | CUTANEOUS | Status: DC
  Administered 2016-08-14: 6 via TOPICAL

## 2016-08-13 MED ORDER — DEXTROSE 5 % IV SOLN
INTRAVENOUS | Status: DC
  Administered 2016-08-13: 13:00:00 via INTRAVENOUS
  Filled 2016-08-13 (×3): qty 150

## 2016-08-13 MED ORDER — SODIUM CHLORIDE 0.9 % IV SOLN
Freq: Once | INTRAVENOUS | Status: AC
  Administered 2016-08-13: 17:00:00 via INTRAVENOUS

## 2016-08-13 MED ORDER — PROPOFOL 1000 MG/100ML IV EMUL
5.0000 ug/kg/min | INTRAVENOUS | Status: DC
  Filled 2016-08-13: qty 100

## 2016-08-13 MED ORDER — VANCOMYCIN HCL IN DEXTROSE 1-5 GM/200ML-% IV SOLN
1000.0000 mg | Freq: Once | INTRAVENOUS | Status: AC
  Administered 2016-08-13: 1000 mg via INTRAVENOUS
  Filled 2016-08-13: qty 200

## 2016-08-13 MED ORDER — MAGNESIUM SULFATE 2 GM/50ML IV SOLN
2.0000 g | Freq: Once | INTRAVENOUS | Status: AC
  Administered 2016-08-13: 2 g via INTRAVENOUS
  Filled 2016-08-13: qty 50

## 2016-08-13 MED ORDER — DEXTROSE-NACL 5-0.45 % IV SOLN
INTRAVENOUS | Status: DC
  Administered 2016-08-13: 08:00:00 via INTRAVENOUS

## 2016-08-13 MED ORDER — NITROGLYCERIN IN D5W 200-5 MCG/ML-% IV SOLN
5.0000 ug/min | INTRAVENOUS | Status: DC
  Administered 2016-08-13: 5 ug/min via INTRAVENOUS
  Filled 2016-08-13: qty 250

## 2016-08-13 MED ORDER — SODIUM BICARBONATE 8.4 % IV SOLN
INTRAVENOUS | Status: AC
  Administered 2016-08-13: 16:00:00
  Filled 2016-08-13: qty 50

## 2016-08-13 MED ORDER — EPINEPHRINE PF 1 MG/10ML IJ SOSY
PREFILLED_SYRINGE | INTRAMUSCULAR | Status: AC
Start: 2016-08-13 — End: 2016-08-13
  Administered 2016-08-13: 18:00:00
  Filled 2016-08-13: qty 30

## 2016-08-13 MED ORDER — DEXTROSE 5 % IV SOLN
1.0000 g | INTRAVENOUS | Status: DC
  Administered 2016-08-13: 1 g via INTRAVENOUS
  Filled 2016-08-13: qty 10

## 2016-08-13 MED ORDER — PROPOFOL 1000 MG/100ML IV EMUL
0.0000 ug/kg/min | INTRAVENOUS | Status: DC
  Administered 2016-08-13: 10 ug/kg/min via INTRAVENOUS

## 2016-08-13 MED ORDER — HYDROCORTISONE NA SUCCINATE PF 100 MG IJ SOLR: 50.0000 mg | Freq: Four times a day (QID) | INTRAMUSCULAR | Status: DC

## 2016-08-13 MED ORDER — ACETAMINOPHEN 160 MG/5ML PO SOLN
650.0000 mg | ORAL | Status: DC | PRN
  Administered 2016-08-13: 650 mg
  Filled 2016-08-13: qty 20.3

## 2016-08-13 MED ORDER — CHLORHEXIDINE GLUCONATE 0.12% ORAL RINSE (MEDLINE KIT): 15.0000 mL | Freq: Two times a day (BID) | OROMUCOSAL | Status: DC

## 2016-08-13 MED ORDER — DEXTROSE 50 % IV SOLN
INTRAVENOUS | Status: AC
  Filled 2016-08-13: qty 50

## 2016-08-13 MED ORDER — VITAL HIGH PROTEIN PO LIQD
1000.0000 mL | ORAL | Status: DC
  Filled 2016-08-13: qty 1000

## 2016-08-13 MED ORDER — SODIUM CHLORIDE 0.9 % IV SOLN
0.5000 mg/h | INTRAVENOUS | Status: DC
  Filled 2016-08-13: qty 10

## 2016-08-13 MED ORDER — SODIUM CHLORIDE 0.9 % IV SOLN
25.0000 ug/h | INTRAVENOUS | Status: DC
  Administered 2016-08-13: 25 ug/h via INTRAVENOUS
  Filled 2016-08-13 (×2): qty 50

## 2016-08-13 MED ORDER — SODIUM BICARBONATE 8.4 % IV SOLN
INTRAVENOUS | Status: AC
  Filled 2016-08-13: qty 100

## 2016-08-13 MED ORDER — DEXTROSE 50 % IV SOLN
25.0000 mL | Freq: Once | INTRAVENOUS | Status: AC
  Administered 2016-08-13: 25 mL via INTRAVENOUS

## 2016-08-13 MED ORDER — PANTOPRAZOLE SODIUM 40 MG IV SOLR
40.0000 mg | Freq: Every day | INTRAVENOUS | Status: DC
  Administered 2016-08-13: 40 mg via INTRAVENOUS
  Filled 2016-08-13 (×2): qty 40

## 2016-08-13 MED ORDER — HEPARIN SODIUM (PORCINE) 5000 UNIT/ML IJ SOLN
5000.0000 [IU] | Freq: Three times a day (TID) | INTRAMUSCULAR | Status: DC
  Administered 2016-08-13 – 2016-08-16 (×10): 5000 [IU] via SUBCUTANEOUS
  Filled 2016-08-13 (×11): qty 1

## 2016-08-13 MED ORDER — METHYLPREDNISOLONE SODIUM SUCC 125 MG IJ SOLR
125.0000 mg | Freq: Four times a day (QID) | INTRAMUSCULAR | Status: DC
  Administered 2016-08-13 – 2016-08-19 (×23): 125 mg via INTRAVENOUS
  Filled 2016-08-13 (×22): qty 2

## 2016-08-13 MED ORDER — DEXTROSE 5 % IV SOLN
0.5000 ug/min | INTRAVENOUS | Status: DC
  Administered 2016-08-13: 8 ug/min via INTRAVENOUS
  Administered 2016-08-13: 5 ug/min via INTRAVENOUS
  Administered 2016-08-14 (×2): 8 ug/min via INTRAVENOUS
  Administered 2016-08-15: 1 ug/min via INTRAVENOUS
  Administered 2016-08-15: 3 ug/min via INTRAVENOUS
  Filled 2016-08-13 (×7): qty 4

## 2016-08-13 MED ORDER — VANCOMYCIN HCL 500 MG IV SOLR: 500.0000 mg | INTRAVENOUS | Status: DC

## 2016-08-13 MED ORDER — LABETALOL HCL 5 MG/ML IV SOLN
0.5000 mg/min | INTRAVENOUS | Status: DC
  Administered 2016-08-13: 0.5 mg/min via INTRAVENOUS
  Filled 2016-08-13: qty 20

## 2016-08-13 MED ORDER — NITROGLYCERIN IN D5W 200-5 MCG/ML-% IV SOLN
0.0000 ug/min | INTRAVENOUS | Status: DC
  Administered 2016-08-13: 5 ug/min via INTRAVENOUS

## 2016-08-13 MED ORDER — SODIUM CHLORIDE 0.9 % IV SOLN: 250.0000 mL | INTRAVENOUS | Status: DC | PRN

## 2016-08-13 MED ORDER — DEXTROSE 5 % IV SOLN
2.0000 g | INTRAVENOUS | Status: DC
  Administered 2016-08-13: 2 g via INTRAVENOUS
  Filled 2016-08-13: qty 2

## 2016-08-13 MED ORDER — NICARDIPINE HCL IN NACL 20-0.86 MG/200ML-% IV SOLN
3.0000 mg/h | INTRAVENOUS | Status: DC
  Administered 2016-08-13: 5 mg/h via INTRAVENOUS
  Filled 2016-08-13: qty 200

## 2016-08-13 MED ORDER — PRO-STAT SUGAR FREE PO LIQD
30.0000 mL | Freq: Every day | ORAL | Status: DC
  Administered 2016-08-13: 30 mL
  Filled 2016-08-13: qty 30

## 2016-08-13 MED ORDER — SODIUM BICARBONATE 8.4 % IV SOLN
100.0000 meq | Freq: Once | INTRAVENOUS | Status: AC
  Administered 2016-08-13: 100 meq via INTRAVENOUS

## 2016-08-13 MED ORDER — FENTANYL BOLUS VIA INFUSION
50.0000 ug | INTRAVENOUS | Status: DC | PRN
  Administered 2016-08-13: 50 ug via INTRAVENOUS
  Filled 2016-08-13: qty 50

## 2016-08-13 MED FILL — Medication: Qty: 1 | Status: AC

## 2016-08-13 NOTE — Consult Note (Addendum)
   Was called to [facility] ICU to see patient s/p cardiac arrests - PEA.  31 y/o woman admitted with ? HTN Emergency; HTN & Tachycardic-> started on IV Labetalol gtt --> had brief PEA Arrest. Has known h/o hemolytic anemia s/p splenectomy (follows Hem -Onc) -- has had ~60 lb wgt loss in ~2 yrs.  Progressive weakness & edema.   BPs ~657Q systolic upon arrival.   Post-arrest Echo showed ~mod posterior/basal pericardial effusion with EF ~20-25%; changed to CCB gtt & became hypotensive after ~ 1 hr.  (~9AM).,  ~1220 PM - PEA arrest - we were called for ? Pericardial tamponade.  Upon my arrival - she was STachy 140s-150s with SBP in 170s.  Intubated. Acidosis being corrected with bicarb -- since bicarb initiated, now HTN.  I personally reviewed the Echo & discussed with Drs. Croitoru & Bensimhon. Effusion is moderate & there are som features of increased pressures, but not necessarily tamponade.  Clinical picture is more c/w acute myopericarditis with effusion & very poor EF.    Echo from this AM (personally rewviewed)  Severe global reduction in LV function - EF 20-25%; mild LVH; mild AI; mild MR; mild LAE; severely reduced RV function; moderate to large pericardial effusion predominantly anterior and around RA; mild collapse of RA but no RV diastolic collapse; IVC mildly dilated but there is some collapse.  She has had a relatively large odorous BM - ? Concern for bowel ischemia.   Principal Problem:   Malignant hypertensive cardiomyopathy, with heart failure ([place]) Active Problems:   Acute respiratory failure ([place])   Acute myopericarditis   Cardiac arrest [facility])   Recommendation:  is to hold off on Pericardiocentesis for now.  Transfer to [facility] MICU (65M) since no 2H beds available  CHF team (Dr. Haroldine Laws) will consult formally upon arrival. - recommends no Labetalol, will likely start [place] for inotropic support; if hypotensive - Levophed gtt for BP support.  Will need R&LHC & potentially  Pericardiocentesis vs. Window  PCCM is running [facility] [employer] Disease labs.  Empirically cover with Abx given fever  The patient is critically ill with multiple organ system failure. - s/p cardiac arrest with severe cardiomyopathy / myopericarditis. I personally reviewed the Echo & discussed care with Echo reader & Dr. Haroldine Laws as well as PCCM to coordinate care. > 60 min total spent with patient & chart review.  Glenetta Hew, MD

## 2016-08-13 NOTE — Procedures (Signed)
  The the pre-existing LIJ cordis placed by Dr. Lake Bells, I place a 7FR swan into the central circulation. A ful RHC was performed using pressure waveform data. (see full hemodynamics in the chart).   The catheter was secured into place and postioning confirmed by CXR.  No complications noted.   [name], MD  11:26 PM

## 2016-08-13 NOTE — Progress Notes (Signed)
Code Blue Note  Called urgently to bedside. Pt experienced several minutes progressive hypotension that prompted discontinuation of her sedation. Then acute brady to 50's (from sinus tachy 140's) and loss of pulse. CPR initiated. 2 amps bicarb given (had already been ordered prior to code blue). Restoration of pulse after 3 minutes, before any epi was given. Considered emergency pericardiocentesis, but I deferred as she stabilized. I feel that this would best be done by Cardiology under more controlled circumstances if at all possible. If she has a recurrent arrest then I will perform.   Back to sinus tachy, 150 now. BP currently 180/130 with sedation and nicardipine off.   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 At high risk tamponade physiology. Cardiology has been contacted and will see her ASAP.   Baltazar Apo, MD, PhD 08/26/2016, 12:50 PM [facility] [phone] or if no answer [phone]

## 2016-08-13 NOTE — Progress Notes (Signed)
[facility] Progress Note Patient Name: Casey Chapman DOB: December 05, 1985 MRN: [id number]   Date of Service  08/12/2016  HPI/Events of Note  Lactic Acid level = 17.1 --> 12.5. Lactic acid is clearing.   eICU Interventions  Continue present management.      Intervention Category Major Interventions: Acid-Base disturbance - evaluation and management  Sommer,Steven Eugene 08/16/2016, 9:52 PM

## 2016-08-13 NOTE — Progress Notes (Signed)
Notified of elevated troponin.  Now 42.94 (up from 7.21).  ECHO pending.  Cardiology consulted.  Likely will need to transfer to [facility].  May need pericardial drain with biopsy.  Noe Gens, NP-C [facility] Pgr: [phone] or if no answer [phone] 08/18/2016, 11:41 AM

## 2016-08-13 NOTE — Progress Notes (Signed)
[facility] PCCM  Arrived to 80M in cardiac arrest.  CPR performed Cordis placed to facilitate [facility] by Cardiology team Has severe shock, respiratory failure.  Vitals:   08/28/2016 1315 08/12/2016 1330 08/14/2016 1400 09/04/2016 1500  BP: (!) 144/113 98/78 100/83 (!) 174/130  Pulse:  (!) 155 (!) 154 (!) 165  Resp: (!) 30 (!) 30 (!) 31 (!) 22  Temp: (!) 103.8 F (39.9 C) (!) 104 F (40 C) (!) 104.2 F (40.1 C) (!) 103.6 F (39.8 C)  TempSrc:      SpO2:  91% 91% (!) 75%  Weight:      Height:   5\' 6"  (1.676 m)    Vent Mode: PRVC FiO2 (%):  [40 %-100 %] 100 % Set Rate:  [15 bmp-30 bmp] 30 bmp Vt Set:  [420 mL-470 mL] 470 mL PEEP:  [5 cmH20] 5 cmH20 Plateau Pressure:  [16 cmH20-21 cmH20] 19 cmH20  Swan: RA 42/24, Mean 30 Wedge 14 CVP 10  General:  In bed on vent HENT: NCAT ETT in place PULM: CTA B, vent supported breathing CV: RRR, no mgr GI: BS+, soft, nontender MSK: normal bulk and tone Derm: thick skin over neck/chest Neuro: sedated, unresponsive  CBC    Component Value Date/Time   WBC 16.5 (H) 09/02/2016 0952   RBC 2.97 (L) 08/29/2016 0952   HGB 8.6 (L) 09/04/2016 0952   HGB 12.2 11/25/2015 1401   HCT 26.8 (L) 09/02/2016 0952   HCT 38.2 11/25/2015 1401   PLT 40 (L) 08/27/2016 0952   PLT 206 11/25/2015 1401   MCV 90.2 08/17/2016 0952   MCV 90.7 11/25/2015 1401   MCH 29.0 08/09/2016 0952   MCHC 32.1 08/10/2016 0952   RDW 20.5 (H) 08/30/2016 0952   RDW 16.3 (H) 11/25/2015 1401   LYMPHSABS 4.2 (H) 05/02/2016 1740   LYMPHSABS 4.1 (H) 11/25/2015 1401   MONOABS 1.7 (H) 05/02/2016 1740   MONOABS 1.4 (H) 11/25/2015 1401   EOSABS 0.1 05/02/2016 1740   EOSABS 0.1 11/25/2015 1401   EOSABS 0.1 10/25/2009 1358   BASOSABS 0.0 05/02/2016 1740   BASOSABS 0.0 11/25/2015 1401   BMET    Component Value Date/Time   NA 134 (L) 08/10/2016 0952   NA 139 11/25/2015 1402   K 5.6 (H) 08/25/2016 0952   K 4.0 11/25/2015 1402   CL 100 (L) 08/16/2016 0952   CL 99 10/25/2009 1358   CO2 13  (L) 08/08/2016 0952   CO2 23 11/25/2015 1402   GLUCOSE 131 (H) 08/06/2016 0952   GLUCOSE 101 11/25/2015 1402   GLUCOSE 96 10/25/2009 1358   BUN 30 (H) 08/29/2016 0952   BUN 11.3 11/25/2015 1402   CREATININE 1.77 (H) 08/12/2016 0952   CREATININE 0.6 11/25/2015 1402   CALCIUM 8.0 (L) 08/23/2016 0952   CALCIUM 9.9 11/25/2015 1402   GFRNONAA 37 (L) 08/15/2016 0952   GFRAA 43 (L) 08/20/2016 0952     Impression: Severe refractory shock: favor cardiogenic: viral? Lupus related? Acute respiratory failuire with hypoxemia History of autoimmune hemolytic anemia Pericardial effusion Lactic acidosis AKI  Plan: Shock: if develops pulses paradoxicus will need pericardiocentesis; in the meantime support with epinephrine, correct acidosis; hold further fluids; add stress dose steroids: solumedrol; empirically cover for sepsis while awaiting cultures Respiratory failure: paralyze for vent synchrony, continue fentanyl for sedation AKI: f/u repeat ABG and BMET, may need CVVHD tonight if persistent acidosis SLE: labs pending from this morning, f/u; empirically cover with solumedrol now Anemia: transfuse 1 U PRBC now  My cc time 90 minutes  Roselie Awkward, MD [facility] Pager: [phone] Cell: [phone] After 3pm or if no response, call [phone]

## 2016-08-13 NOTE — ED Triage Notes (Signed)
Patient is complaining of bp being high. Patient states she went to urgent care on Friday and her bp was high then. She had someone to check bp today and it was high. Patient is also having numbness in left hand and both legs. Patient fell on Tuesday of last week and had a muscle strain in left leg.

## 2016-08-13 NOTE — Progress Notes (Addendum)
Pharmacy Antibiotic Note  Makilah Dowda is a 31 y.o. female admitted on 08/24/2016 s/p cardiac arrest several lines placed during code.  WBC elevated on admission 16 Tm 104 Cr 1 at BL bust suspect rising post arrest.  Pharmacy has been consulted for Cefepime and Vancomycin dosing.  Pt with AKI, scr 0.88 >> 2.23, trending up quickly, est. crcl ~ 30-35 ml/min.  Plan: Stop ceftriaxone Cefepime 2gm q24h Vancomycin 1gm x1 Vancomycin 750 mg Q 24 hrs next dose tomorrow @ 1800 Monitor renal function closely.  Height: 5\' 6"  (167.6 cm) Weight: 130 lb 15.3 oz (59.4 kg) IBW/kg (Calculated) : 59.3  Temp (24hrs), Avg:101.7 F (38.7 C), Min:97.8 F (36.6 C), Max:104.2 F (40.1 C)   Recent Labs Lab 09/04/2016 0028 08/16/2016 0631 08/11/2016 0810 09/04/2016 0952 09/04/2016 1000 08/14/2016 1653  WBC 13.8* 13.6*  --  16.5*  --  21.8*  CREATININE 0.88 1.46*  --  1.77*  --  2.23*  LATICACIDVEN  --  11.5* 11.1*  --  9.0*  --     Estimated Creatinine Clearance: 33.6 mL/min (A) (by C-G formula based on SCr of 2.23 mg/dL (H)).    No Known Allergies  Antimicrobials this admission: Ceftriaxone 7/9>7/9  Dose adjustments this admission:   Maryanna Shape, PharmD, BCPS  Clinical Pharmacist  Pager: [phone]   08/16/2016 6:00 PM   Addendum:  Pt's renal function continue worsening overnight. Scr 0.88 >> 2.61. Est. crcl ~ 25-30 ml/min  Plan: Change vancomycin to 500 mg Q 24 hrs for now Change cefepime to 1g Q 24 hrs F/u renal function

## 2016-08-13 NOTE — Procedures (Signed)
Central Venous Catheter Insertion Procedure Note Casey Chapman [id number] Sep 16, 1985  Procedure: Insertion of Central Venous Catheter Indications: Assessment of intravascular volume  Procedure Details Consent: Risks of procedure as well as the alternatives and risks of each were explained to the (patient/caregiver).  Consent for procedure obtained. Time Out: Verified patient identification, verified procedure, site/side was marked, verified correct patient position, special equipment/implants available, medications/allergies/relevent history reviewed, required imaging and test results available.  Performed  Maximum sterile technique was used including antiseptics, cap, gloves, gown, hand hygiene, mask and sheet. Skin prep: Chlorhexidine; local anesthetic administered A antimicrobial bonded/coated single lumen catheter was placed in the left internal jugular vein using the Seldinger technique.  Ultrasound was used to verify the patency of the vein and for real time needle guidance.  Evaluation Blood flow good Complications: No apparent complications Patient did tolerate procedure well. Chest X-ray ordered to verify placement.  CXR: pending.  [name] 08/27/2016, 5:05 PM

## 2016-08-13 NOTE — Progress Notes (Signed)
Pt transported to ICU on VENT without incident.  Pt tolerated well, RT to monitor and assess as needed.

## 2016-08-13 NOTE — Progress Notes (Signed)
Ventilator circuit change, due to it being soiled. Pt was manually ventilated throughout circuit change. no distress or complications noted. Pt stable throughout.

## 2016-08-13 NOTE — Progress Notes (Signed)
   08/22/2016 1555  Clinical Encounter Type  Visited With Patient and family together  Visit Type Code  Spiritual Encounters  Spiritual Needs Emotional  Stress Factors  Patient Stress Factors Not reviewed  Family Stress Factors Family relationships  Responded to Code Blue. Ministry of presence with family. Have on call chaplain follow as needed.

## 2016-08-13 NOTE — Progress Notes (Signed)
Initial Nutrition Assessment  INTERVENTION:   Monitor magnesium, potassium, and phosphorus daily for at least 3 days, MD to replete as needed, as pt is at risk for refeeding syndrome given severe malnutrition and poor PO intake for >2 weeks.  -Initiate Vital 1.5 @ 20 ml/hr via OGT and increase by 10 ml every 12 hours to goal rate of 45 ml/hr.  -30 ml Prostat once daily.  Tube feeding regimen +current Propofol infusion provides 1912 kcal (98% of needs), 88 grams of protein, and 825 ml of H2O.   NUTRITION DIAGNOSIS:   Malnutrition (severe) related to acute illness as evidenced by energy intake < or equal to 50% for > or equal to 5 days, moderate depletion of body fat, moderate depletions of muscle mass.  GOAL:   Patient will meet greater than or equal to 90% of their needs  MONITOR:   Vent status, Labs, Weight trends, TF tolerance, I & O's  REASON FOR ASSESSMENT:   Consult Enteral/tube feeding initiation and management  ASSESSMENT:   31 year old female with PMH of autoimmune hemolytic anemia s/p splenectomy, being followed by Dr. Burr Medico in Hematology. Has reported 60 lb weight loss in past 2 years with history of weakness and swelling in lower extremity.   Patient in room with family at bedside. Per husband, pt has not been eating well for the past 2 weeks but her intake has slowly decreased over the past 2 years. The patient has tried to consume protein shakes but did not drink them consistently. PTA pt ate some chicken and BBQ on July 4th. She tried to eat a salad from [employer] and she ate a few bites of gumbo.   Per pt's husband, pt has lost 60 lb in the last 2 years. Per chart review, pt has lost 50 lb over the past 3 years. All weight loss is progressive but insignificant for time frame).  Nutrition-Focused physical exam completed. Findings are mild-moderate fat depletion, mild-moderate muscle depletion, and no edema.   Patient is currently intubated on ventilator support MV:  10.3 L/min Temp (24hrs), Avg:99.5 F (37.5 C), Min:97.8 F (36.6 C), Max:102.6 F (39.2 C)  Propofol: 7.3 ml/hr -provides 192 kcal  Labs reviewed. Medications: D5 .45% NaCl infusion at 75 ml/hr -provides 306 kcal  Diet Order:  Diet NPO time specified  Skin:  Reviewed, no issues  Last BM:  7/9  Height:   Ht Readings from Last 1 Encounters:  09/04/2016 5\' 7"  (1.702 m)    Weight:   Wt Readings from Last 1 Encounters:  08/08/2016 130 lb 15.3 oz (59.4 kg)    Ideal Body Weight:  61.4 kg  BMI:  Body mass index is 20.51 kg/m.  Estimated Nutritional Needs:   Kcal:  1934  Protein:  90-100g  Fluid:  2L/day  EDUCATION NEEDS:   No education needs identified at this time  Clayton Bibles, MS, RD, LDN Pager: [phone] After Hours Pager: [phone]

## 2016-08-13 NOTE — H&P (Signed)
STAFF NOTE: I, Dr Casey Chapman have personally reviewed patient's available data, including medical history, events of note, physical examination and test results as part of my evaluation. I have discussed with resident/NP and other care providers such as pharmacist, RN and RRT.  In addition,  I personally evaluated patient and elicited key findings of   S: 31 year old AA female. Accompanied by husband. Hx is ltd from husband. In 2011 s/p splenectomy @ [facility] for AIHA (dr Burr Medico) and since then no specific Rx  Per husband. Other than general chronic fatigue came into ER for high bp based on an urgent care visit. In ER s/p labetalol gtt and s/p brief PEA s/p epi zx 1 with ROSC and followed commands per ER-RN to APP. Since then intubated and fent gtt - BP still high 188 sbp / HR 141 sinus. Workup shows - anemia, metabolic acidosis, severe pericardial effusion on CT (woprse since march 2018), chronic lung cysts (Pulm appt was pending), and hiatal hernia an diffuse chronic adenopathy  Other acute CT findings  - possible pyelo - diffuse anasarca - ileus s- small bowel  Not on chronic pred Does do some MJ Not on immune modulators    has a past medical history of Chlamydia (04/16/12); Eczema; and Hemolytic anemia ([place]) (2012).   reports that she has never smoked. She has never used smokeless tobacco.  Past Surgical History:  Procedure Laterality Date  . SPLENECTOMY  2012   . TONSILLECTOMY  1990    No Known Allergies  Immunization History  Administered Date(s) Administered  . Tdap 04/29/2013    Family History  Problem Relation Age of Onset  . Diabetes Mother   . Hypertension Mother   . Depression Mother   . Diabetes Brother   . Diabetes Maternal Grandmother   . Diabetes Paternal Grandmother   . Hypertension Paternal Grandmother   . Thyroid disease Father        Thyroid removed     Current Facility-Administered Medications:  .  0.9 %  sodium chloride infusion, 250 mL, Intravenous,  PRN, Omar Person, NP .  fentaNYL (SUBLIMAZE) 2,500 mcg in sodium chloride 0.9 % 250 mL (10 mcg/mL) infusion, 25-400 mcg/hr, Intravenous, Continuous, Eubanks, Katalina M, NP, Last Rate: 5 mL/hr at 08/15/2016 0524, 50 mcg/hr at 08/07/2016 0524 .  fentaNYL (SUBLIMAZE) bolus via infusion 50 mcg, 50 mcg, Intravenous, Q1H PRN, Omar Person, NP .  heparin injection 5,000 Units, 5,000 Units, Subcutaneous, Q8H, Eubanks, Katalina M, NP .  pantoprazole (PROTONIX) injection 40 mg, 40 mg, Intravenous, QHS, Eubanks, Katalina M, NP .  propofol (DIPRIVAN) 1000 MG/100ML infusion, 0-50 mcg/kg/min, Intravenous, Continuous, Omar Person, NP, Last Rate: 3.6 mL/hr at 08/20/2016 0524, 10 mcg/kg/min at 08/10/2016 0524  Current Outpatient Prescriptions:  .  acetaminophen (TYLENOL) 500 MG tablet, Take 500-1,000 mg by mouth every 6 (six) hours as needed for mild pain, moderate pain, fever or headache. pain , Disp: , Rfl:  .  furosemide (LASIX) 20 MG tablet, Take 1 tablet (20 mg total) by mouth daily. (Patient taking differently: Take 20 mg by mouth daily as needed for edema. ), Disp: 5 tablet, Rfl: 0 .  hydrocortisone cream 0.5 %, Apply 1 application topically 2 (two) times daily as needed (for itching on arms/legs). , Disp: , Rfl:  .  ibuprofen (ADVIL,MOTRIN) 200 MG tablet, Take 400-800 mg by mouth every 6 (six) hours as needed for fever, headache, mild pain, moderate pain or cramping. , Disp: , Rfl:  O: cachecti female Pale Chronic and crtiically unwell looking Sync with vent Hypertensive and Tachycardic  PULMONARY  Recent Labs Lab 08/28/2016 0425  PHART 7.107*  PCO2ART 24.2*  PO2ART 464*  HCO3 7.3*  O2SAT 99.3    CBC  Recent Labs Lab 08/25/2016 0028  HGB 9.5*  HCT 29.4*  WBC 13.8*  PLT 120*    COAGULATION No results for input(s): INR in the last 168 hours.  CARDIAC  No results for input(s): TROPONINI in the last 168 hours. No results for input(s): PROBNP in the last 168  hours.   CHEMISTRY  Recent Labs Lab 08/15/2016 0028  NA 135  K 3.3*  CL 103  CO2 23  GLUCOSE 98  BUN 19  CREATININE 0.88  CALCIUM 9.3   Estimated Creatinine Clearance: 85.1 mL/min (by C-G formula based on SCr of 0.88 mg/dL).   LIVER No results for input(s): AST, ALT, ALKPHOS, BILITOT, PROT, ALBUMIN, INR in the last 168 hours.   INFECTIOUS No results for input(s): LATICACIDVEN, PROCALCITON in the last 168 hours.   ENDOCRINE CBG (last 3)  No results for input(s): GLUCAP in the last 72 hours.       IMAGING x48h  - image(s) personally visualized  -   highlighted in bold Dg Chest 2 View  Result Date: 08/25/2016 CLINICAL DATA:  Initial evaluation for acute shortness of breath. EXAM: CHEST  2 VIEW COMPARISON:  Prior radiograph from 05/02/2016. FINDINGS: Severe cardiomegaly, mildly worsened relative to prior. Possible pericardial fusion could be considered. Mediastinal silhouette within normal limits. Lungs normally inflated. No focal infiltrate, pulmonary edema, or pleural effusion. No pneumothorax. No acute osseus abnormality.  Mild scoliosis noted. IMPRESSION: 1. Severe cardiomegaly, mildly worsened from previous. Possible pericardial effusion could be considered. 2. No other active cardiopulmonary disease. Electronically Signed   By: Jeannine Boga M.D.   On: 08/14/2016 00:44   Ct Head Wo Contrast  Result Date: 08/14/2016 CLINICAL DATA:  Elevated blood pressure, numbness in the left hand EXAM: CT HEAD WITHOUT CONTRAST TECHNIQUE: Contiguous axial images were obtained from the base of the skull through the vertex without intravenous contrast. COMPARISON:  08/03/2009 FINDINGS: Brain: No evidence of acute infarction, hemorrhage, hydrocephalus, extra-axial collection or mass lesion/mass effect. Vascular: No hyperdense vessel or unexpected calcification. Skull: Normal. Negative for fracture or focal lesion. Sinuses/Orbits: Minimal mucosal thickening in the ethmoid sinuses. No  acute orbital abnormality. Other: None IMPRESSION: No CT evidence for acute intracranial abnormality Electronically Signed   By: Donavan Foil M.D.   On: 08/18/2016 03:59   Dg Chest Portable 1 View  Result Date: 08/10/2016 CLINICAL DATA:  Post intubation EXAM: PORTABLE CHEST 1 VIEW COMPARISON:  08/05/2016, 05/02/2016 FINDINGS: Interval intubation, tip of the endotracheal tube is approximately 2.3 cm superior to the carina. Esophageal tube tip is below the diaphragm but is not included. Moderate-to-marked cardiomegaly with globular cardiac configuration. Increased hazy perihilar opacities suspicious for vascular congestion and edema. No pneumothorax. IMPRESSION: 1. Endotracheal tube tip approximately 2.3 cm superior to the carina 2. Moderate to marked cardiomegaly with globular cardiac configuration, consider pericardial effusion 3. Development of hazy bilateral pulmonary opacity suspicious for vascular congestion and pulmonary edema Electronically Signed   By: Donavan Foil M.D.   On: 09/03/2016 03:43   Ct Angio Chest/abd/pel For Dissection W And/or Wo Contrast  Result Date: 08/24/2016 CLINICAL DATA:  31 year old female with elevated blood pressure for several days presenting with left and and bilateral lower extremity numbness. Patient has a history of autoimmune hemolytic anemia  and splenectomy in 2012. EXAM: CT ANGIOGRAPHY CHEST, ABDOMEN AND PELVIS TECHNIQUE: Multidetector CT imaging through the chest, abdomen and pelvis was performed using the standard protocol during bolus administration of intravenous contrast. Multiplanar reconstructed images and MIPs were obtained and reviewed to evaluate the vascular anatomy. CONTRAST:  100 cc Isovue 370 COMPARISON:  Chest radiograph dated 08/06/2016 and CT dated 05/02/2016 FINDINGS: CTA CHEST FINDINGS Cardiovascular: There is stable moderate cardiomegaly. There is a large pericardial effusion, increased compared to the prior study measuring approximately 3 cm in  greatest axial thickness. The pericardial fluid demonstrates lower attenuation, likely simple or minimally complex fluid. There is retrograde flow of contrast from the right atrium into the IVC consistent with right cardiac dysfunction. Correlation with clinical exam and echocardiogram recommended. The thoracic aorta is unremarkable. The origins of the great vessels of the aortic arch appear patent. The central pulmonary artery is appear patent. Mediastinum/Nodes: No hilar or mediastinal adenopathy. An enteric tube is noted extending along the esophagus and terminating in the body of the stomach. Lungs/Pleura: Evaluation of the lungs is somewhat limited due to respiratory motion artifact. There are small scattered pneumatoceles. There is diffuse interstitial prominence and ground-glass airspace opacity, likely interstitial and alveolar edema. Patchy areas of increased density primarily involving the lower lobes as well as a small area of density in the right upper lobe along the pleura may be related to edema or represent superimposed pneumonia. Clinical correlation is recommended. Small amount of fluid noted layering along the seizures. No large pleural effusion. There is no pneumothorax. The central airways are patent. An endotracheal tube remains above the carina. Musculoskeletal: Large left axillary lymph nodes measures up to 1.5 cm in short axis similar to prior study. Top-normal retropectoral lymph nodes appear improved compared to the prior study. There is mild diffuse soft tissue edema. No acute osseous pathology. Review of the MIP images confirms the above findings. CTA ABDOMEN AND PELVIS FINDINGS VASCULAR Aorta: Normal caliber aorta without aneurysm, dissection, vasculitis or significant stenosis. Celiac: Patent without evidence of aneurysm, dissection, vasculitis or significant stenosis. SMA: Patent without evidence of aneurysm, dissection, vasculitis or significant stenosis. Renals: Both renal arteries  are patent without evidence of aneurysm, dissection, vasculitis, fibromuscular dysplasia or significant stenosis. IMA: Patent without evidence of aneurysm, dissection, vasculitis or significant stenosis. Inflow: Patent without evidence of aneurysm, dissection, vasculitis or significant stenosis. Veins: No obvious venous abnormality within the limitations of this arterial phase study. Review of the MIP images confirms the above findings. NON-VASCULAR No intra-abdominal free air.  Small free fluid within the pelvis. Hepatobiliary: Mild enlargement of the liver. No intrahepatic biliary ductal dilatation. No calcified gallstone. Trace pericholecystic fluid may be present. Pancreas: Unremarkable. No pancreatic ductal dilatation or surrounding inflammatory changes. Spleen: Splenectomy. Multiple small splenules noted inferior to the splenectomy bed. Adrenals/Urinary Tract: The adrenal glands appear unremarkable. There is heterogeneously appearing enhancing kidneys with serrated parenchymal enhancement concerning for pyelonephritis. Correlation with clinical exam and urinalysis recommended. Evaluation of the kidneys is however limited as nephrographic phase images are not provided. No fluid collection. No hydronephrosis. The urinary bladder is grossly unremarkable. Stomach/Bowel: An enteric tube is seen with tip in the body of the stomach. The stomach is somewhat decompressed. There are multiple dilated loops of small bowel 4.5 cm in diameter. No discrete transition zone identified. Findings may represent a functional ileus however, a distal small bowel obstruction is not entirely excluded. The colon is predominantly collapsed. The appendix is not visualized with certainty. No inflammatory changes  identified in the right lower quadrant. Lymphatic: Bilateral iliac chain adenopathy measuring up to 19 mm in short axis. Mildly enlarged bilateral inguinal lymph nodes. This findings are relatively similar or slightly improved  compared to the prior CT. Reproductive: The uterus and ovaries are grossly unremarkable. Other: Diffuse subcutaneous soft tissue edema.  No fluid collection. Musculoskeletal: No acute or significant osseous findings. Review of the MIP images confirms the above findings. IMPRESSION: 1. Stable cardiomegaly. There is a large pericardial effusion which has increased compared to the prior studies with findings of right cardiac dysfunction. Correlation with clinical exam and echocardiogram recommended. 2. No CT evidence of aortic dissection or aneurysm. No central pulmonary artery embolus. 3. Vascular congestion and pulmonary edema. Bilateral lower lobe airspace opacities may represent alveolar edema, atelectasis, or infiltrate. 4. Scattered cystic changes of the lungs similar to the study of 12/09/2015. 5. Left axillary, subpectoral, and bilateral iliac chain adenopathy, similar or slightly improved compared to the prior CTs. 6. Heterogeneous enhancement of the kidneys concerning for pyelonephritis. Correlation with clinical exam and urinalysis recommended. 7. Diffusely dilated small bowel may represent ileus. A distal small bowel obstruction is less likely but not excluded. 8. Diffuse subcutaneous edema and anasarca. These results were called by telephone at the time of interpretation on 08/28/2016 at 5:04 am to physician assistant Quincy Carnes , who verbally acknowledged these results. Electronically Signed   By: Anner Crete M.D.   On: 08/09/2016 05:05      A:  #ACUTE Hypertensive emergency -> cardiac arrest PEA -> ROSC wtith epi x 1 and following commands Pericardia effusion - large on echo - no tamponade clinically in terms of BP - new Acidosis - suspect due to arrest Multiple other findings  - anasarca - could be nephrotic syndrome / autoimmuen  - possible pyelo  - small bowel ileus - could be autoimmune  #CHRONIC Hx of AIHA and s.p splenectomy Diffuse adenopathy Lung cysts Hiatal hernia  P:  Control BP on vent - goal sbp 160-170 - 25% drop - fent gtt and diprivan  Rule out SLE/MCTD  Or vasculitis Cards consult for echo  + stat echo Temporize acidosis -> bic bolus and check abg at 6.30am 09/04/2016 and if still acidotic -> start bic gtt Might need heme consult Workup lung cysts - as out patient  No role for inducd hypothermia - did follow commands  Recheck labs at 10am  Updated husband   .  Rest per NP/medical resident whose note is outlined above and that I agree with  The patient is critically ill with multiple organ systems failure and requires high complexity decision making for assessment and support, frequent evaluation and titration of therapies, application of advanced monitoring technologies and extensive interpretation of multiple databases.   Critical Care Time devoted to patient care services described in this note is  60  Minutes. This time reflects time of care of this signee Dr Brand Males. This critical care time does not reflect procedure time, or teaching time or supervisory time of PA/NP/Med student/Med Resident etc but could involve care discussion time    Dr. Brand Males, M.D., [facility].C.P Pulmonary and Critical Care Medicine Staff Physician [facility] Pager: [phone], If no answer or between  15:00h - 7:00h: call [phone]  08/15/2016 5:25 AM

## 2016-08-13 NOTE — H&P (Signed)
PULMONARY / CRITICAL CARE MEDICINE   Name: Malkia Nippert MRN: [id number] DOB: 01/23/1986    ADMISSION DATE:  09/04/2016 CONSULTATION DATE:  08/29/2016  REFERRING MD:  Dr. Randal Buba   CHIEF COMPLAINT:  HTN   HISTORY OF PRESENT ILLNESS:   31 year old female with PMH of autoimmune hemolytic anemia s/p splenectomy, being followed by Dr. Burr Medico in Hematology. Has reported 60 lb weight loss in past 2 years with history of weakness and swelling in lower extremity.   Patient Presented to ED on 7/8 with reported HTN. Reported going to urgent care on Friday, 7/6 with pain in hands/feet. During this visit patient was told that her BP was elevated and she needed to keep an eye on it and follow up with PCP. 7/8 patient noted her systolic to be 222L and was told by a friend to go to the ED. Upon arrival to ED BP was reported 798 systolic. Patient was then started on Labetalol gtt, shortly after patient had a brief cardiac arrest for 2 minutes. CT Head negative for acute process. CTA Chest revealed scattered cystic changes of the lungs and cardiomegaly with a large pericardial effusion which has increased in size compared to last exam. Patient was intubated during cardiac. PCCM asked to admit.   PAST MEDICAL HISTORY :  She  has a past medical history of Chlamydia (04/16/12); Eczema; and Hemolytic anemia ([place]) (2012).  PAST SURGICAL HISTORY: She  has a past surgical history that includes Splenectomy (2012 ) and Tonsillectomy (1990).  No Known Allergies  No current facility-administered medications on file prior to encounter.    Current Outpatient Prescriptions on File Prior to Encounter  Medication Sig  . acetaminophen (TYLENOL) 500 MG tablet Take 500-1,000 mg by mouth every 6 (six) hours as needed for mild pain, moderate pain, fever or headache. pain   . furosemide (LASIX) 20 MG tablet Take 1 tablet (20 mg total) by mouth daily. (Patient taking differently: Take 20 mg by mouth daily as needed for edema. )  .  hydrocortisone cream 0.5 % Apply 1 application topically 2 (two) times daily as needed (for itching on arms/legs).   Marland Kitchen ibuprofen (ADVIL,MOTRIN) 200 MG tablet Take 400-800 mg by mouth every 6 (six) hours as needed for fever, headache, mild pain, moderate pain or cramping.     FAMILY HISTORY:  Her indicated that the status of her mother is unknown. She indicated that the status of her father is unknown. She indicated that the status of her brother is unknown. She indicated that the status of her maternal grandmother is unknown. She indicated that the status of her paternal grandmother is unknown.    SOCIAL HISTORY: She  reports that she has never smoked. She has never used smokeless tobacco. She reports that she drinks alcohol. She reports that she does not use drugs.  REVIEW OF SYSTEMS:   Unable to review as patient is intubated and sedated.   SUBJECTIVE:  On mechanical ventilation   VITAL SIGNS: BP 110/70 (BP Location: Right Arm)   Pulse (!) 113   Temp 98.1 F (36.7 C) (Oral)   Resp 17   Ht 5\' 6"  (1.676 m)   Wt 60.8 kg (134 lb)   LMP 08/09/2016 (Exact Date) Comment: negative beta HCG 08/30/2016  SpO2 (!) 74%   BMI 21.63 kg/m   HEMODYNAMICS:    VENTILATOR SETTINGS: Vent Mode: PRVC FiO2 (%):  [100 %] 100 % Set Rate:  [15 bmp] 15 bmp Vt Set:  [420 mL-470 mL]  470 mL PEEP:  [5 cmH20] 5 cmH20 Plateau Pressure:  [21 cmH20] 21 cmH20  INTAKE / OUTPUT: No intake/output data recorded.  PHYSICAL EXAMINATION: General:  Adult female, appears older than stated age  Neuro:  Sedated, does not follow commands, moves extremities   HEENT:  ETT in place  Cardiovascular:  Tachy, no MRG  Lungs:  Diminished breath sounds, no wheeze, labored  Abdomen:  Non-distended, active bowel sounds  Musculoskeletal:  -edema  Skin:  Scattered skin changes along chest wall and face   LABS:  BMET  Recent Labs Lab 08/07/2016 0028  NA 135  K 3.3*  CL 103  CO2 23  BUN 19  CREATININE 0.88  GLUCOSE  98    Electrolytes  Recent Labs Lab 08/11/2016 0028  CALCIUM 9.3    CBC  Recent Labs Lab 08/23/2016 0028  WBC 13.8*  HGB 9.5*  HCT 29.4*  PLT 120*    Coag's No results for input(s): APTT, INR in the last 168 hours.  Sepsis Markers No results for input(s): LATICACIDVEN, PROCALCITON, O2SATVEN in the last 168 hours.  ABG No results for input(s): PHART, PCO2ART, PO2ART in the last 168 hours.  Liver Enzymes No results for input(s): AST, ALT, ALKPHOS, BILITOT, ALBUMIN in the last 168 hours.  Cardiac Enzymes No results for input(s): TROPONINI, PROBNP in the last 168 hours.  Glucose No results for input(s): GLUCAP in the last 168 hours.  Imaging Dg Chest 2 View  Result Date: 08/17/2016 CLINICAL DATA:  Initial evaluation for acute shortness of breath. EXAM: CHEST  2 VIEW COMPARISON:  Prior radiograph from 05/02/2016. FINDINGS: Severe cardiomegaly, mildly worsened relative to prior. Possible pericardial fusion could be considered. Mediastinal silhouette within normal limits. Lungs normally inflated. No focal infiltrate, pulmonary edema, or pleural effusion. No pneumothorax. No acute osseus abnormality.  Mild scoliosis noted. IMPRESSION: 1. Severe cardiomegaly, mildly worsened from previous. Possible pericardial effusion could be considered. 2. No other active cardiopulmonary disease. Electronically Signed   By: Jeannine Boga M.D.   On: 08/25/2016 00:44   Dg Chest Portable 1 View  Result Date: 08/14/2016 CLINICAL DATA:  Post intubation EXAM: PORTABLE CHEST 1 VIEW COMPARISON:  08/24/2016, 05/02/2016 FINDINGS: Interval intubation, tip of the endotracheal tube is approximately 2.3 cm superior to the carina. Esophageal tube tip is below the diaphragm but is not included. Moderate-to-marked cardiomegaly with globular cardiac configuration. Increased hazy perihilar opacities suspicious for vascular congestion and edema. No pneumothorax. IMPRESSION: 1. Endotracheal tube tip  approximately 2.3 cm superior to the carina 2. Moderate to marked cardiomegaly with globular cardiac configuration, consider pericardial effusion 3. Development of hazy bilateral pulmonary opacity suspicious for vascular congestion and pulmonary edema Electronically Signed   By: Donavan Foil M.D.   On: 08/11/2016 03:43     STUDIES:  CTA Chest 3/28 > Cardiomegaly with trace pericardial effusion for age. There is mild diffuse anasarca. Nonspecific retroclavicular, subpectoral, and bilateral axillary lymphadenopathy. Findings have been chronic unstable back to 2016 CT. No acute pulmonary embolus. Small hiatal hernia with fluid in the distal esophagus likely related to reflux.Scattered pulmonary blebs and bulla. No pneumonic consolidation, dominant mass, effusion or pneumothorax. CXR 7/9 > Endotracheal tube tip approximately 2.3 cm superior to the carina. Moderate to marked cardiomegaly with globular cardiac configuration, consider pericardial effusion Development of hazy bilateral pulmonary opacity suspicious for vascular congestion and pulmonary edema CT Head 7/9 > No acute   CULTURES: None.   ANTIBIOTICS: None.   SIGNIFICANT EVENTS: 7/8 > Presents to ED with  HTN  LINES/TUBES: ETT 7/9 >>   DISCUSSION: 31 year old female presents to ED with HTN. Patient denies symptoms other than chronic progressive weakness. While in ED started on labetalol gtt. After had brief 2 minute cardiac arrest, requiring intubation.   ASSESSMENT / PLAN:  PULMONARY A: Respiratory Insufficiency in setting of cardiac arrest  -CT Chest 11/3 > mild cystic lung disease, ?lymphagioleiomyomatosis P:   Vent Support Wean as tolerated  Trend CXR/ABG > repeat ABG at 0630 Autoimmune workup sent   CARDIOVASCULAR A:  HTN Urgency  ?Pulmonary HTN  Cardiac Arrest  -Brief cardiac arrest for 2 minutes after starting labetalol gtt  H/O Pericardial Effusion noted on CT  P:  ICU Monitoring  ECHO pending  Nitro gtt to  achieve systolic Goal [id number] (advised  Trend Troponin   RENAL A:   No issues  P:   Trend BMP Replace electrolytes as needed   GASTROINTESTINAL A:   Nutritional needs  P:   NPO PPI  HEMATOLOGIC A:   Hemolytic Anemia s/p Splenectomy  -CT A/P in 2015 showed adenopathy E inguinal, external iliac -CT CAP in 2016 showed axilla, supraclavicular and subpectoral adenopathy > progressive since 2011 -Left Axially Lymph node biopsy in 2016 > 2 Benign lymph nodes, no malignancy  P:  Trend CBC  Maintain Hbg >7  INFECTIOUS A:   No issues  P:   Trend WBC and Fever curve  Trend Lactic Acid and Procal   ENDOCRINE A:   No issues    P:   Trend Glucose   NEUROLOGIC A:   Acute Encephalopathy in setting of sedation  P:   RASS goal: 0/-1 Wean Fentanyl and propofol gtt to achieve RASS     FAMILY  - Updates: Husband updated at bedside   - Inter-disciplinary family meet or Palliative Care meeting due by:  08/20/2016   CC Time:62 minutes   Pulmonary and [place] Pager: [phone]  08/27/2016, 3:58 AM

## 2016-08-13 NOTE — Progress Notes (Signed)
Pharmacy Antibiotic Note  Casey Chapman is a 31 y.o. female admitted on 08/23/2016 s/p cardiac arrest several lines placed during code.  WBC elevated on admission 16 Tm 104 Cr 1 at [facility] bust suspect rising post arrest.  Pharmacy has been consulted for Cefepime and Vancomycin  dosing.  Plan: Stop ceftriaxone Cefepime 2gm q24h Vancomycin 1gm x1 follow up renal function for subsequent doses  Height: 5\' 6"  (167.6 cm) Weight: 130 lb 15.3 oz (59.4 kg) IBW/kg (Calculated) : 59.3  Temp (24hrs), Avg:102.6 F (39.2 C), Min:97.8 F (36.6 C), Max:104.2 F (40.1 C)   Recent Labs Lab 08/31/2016 0028 08/11/2016 0631 08/11/2016 0810 08/09/2016 0952 08/11/2016 1000  WBC 13.8* 13.6*  --  16.5*  --   CREATININE 0.88 1.46*  --  1.77*  --   LATICACIDVEN  --  11.5* 11.1*  --  9.0*    Estimated Creatinine Clearance: 42.3 mL/min (A) (by C-G formula based on SCr of 1.77 mg/dL (H)).    No Known Allergies  Antimicrobials this admission: Ceftriaxone 7/9>7/9  Dose adjustments this admission:    [name] Pharm.D. CPP, BCPS Clinical Pharmacist [phone] 09/02/2016 5:08 PM

## 2016-08-13 NOTE — ED Provider Notes (Signed)
[facility] DEPT Provider Note   CSN: [id number] Arrival date & time: 08/12/16  2326     History   Chief Complaint Chief Complaint  Patient presents with  . Numbness  . Hypertension    HPI Casey Chapman is a 31 y.o. female.  The history is provided by the patient.  Hypertension  This is a new problem. The current episode started more than 2 days ago. The problem occurs constantly. The problem has been gradually worsening. Pertinent negatives include no chest pain, no abdominal pain, no headaches and no shortness of breath. Nothing aggravates the symptoms. Nothing relieves the symptoms. She has tried nothing for the symptoms. The treatment provided no relief.  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  Family states she has never been told she had HTN until being seen at urgent care on Friday post fall.  EDP asked her if she had ever been told this prior as she has been hypertensive on previous visits.  Patient and husband state they do not recall this.  Patient denies chest pain, shortness of breath, n/v/d.  No new leg swelling or pain.  No f/c/r.  States the ulnar 2 digits of the left hand have been tingling. Only new medication is flexeril started on Friday.    Past Medical History:  Diagnosis Date  . Chlamydia 04/16/12   Treated   . Eczema   . Hemolytic anemia ([place]) 2012   Autoimmune     Patient Active Problem List   Diagnosis Date Noted  . Lymphadenopathy 05/15/2014  . Hemolytic anemia ([place])   . Eczema   . Chlamydia 04/16/2012    Past Surgical History:  Procedure Laterality Date  . SPLENECTOMY  2012   . TONSILLECTOMY  1990    OB History    Gravida Para Term Preterm AB Living   0 0 0 0 0 0   SAB TAB Ectopic Multiple Live Births   0 0 0 0         Home Medications    Prior to Admission medications   Medication Sig Start Date End Date Taking? Authorizing Provider  acetaminophen (TYLENOL) 500 MG  tablet Take 500 mg by mouth every 6 (six) hours as needed. pain    [provider]  furosemide (LASIX) 20 MG tablet Take 1 tablet (20 mg total) by mouth daily. 05/02/16   Forde Dandy, MD  hydrocortisone cream 0.5 % Apply 1 application topically daily as needed. Apply to arms and legs as needed    [provider]  ibuprofen (ADVIL,MOTRIN) 200 MG tablet Take 400 mg by mouth every 6 (six) hours as needed for moderate pain.    [provider]  Multiple Vitamins-Minerals (MULTIVITAMIN PO) Take 1 tablet by mouth daily.     [provider]  pimecrolimus (ELIDEL) 1 % cream Apply 1 application topically daily.    [provider]  TRIAMCINOLONE ACETONIDE, TOP, (TRIANEX) 0.05 % OINT Apply 1 application topically daily.    [provider]    Family History Family History  Problem Relation Age of Onset  . Diabetes Mother   . Hypertension Mother   . Depression Mother   . Diabetes Brother   . Diabetes Maternal Grandmother   . Diabetes Paternal Grandmother   . Hypertension Paternal Grandmother   . Thyroid disease Father        Thyroid removed    Social History Social History  Substance Use Topics  .  Smoking status: Never Smoker  . Smokeless tobacco: Never Used  . Alcohol use 0.0 oz/week     Comment: Socially     Allergies   Patient has no known allergies.   Review of Systems Review of Systems  Constitutional: Negative for diaphoresis and fever.  Respiratory: Negative for shortness of breath and wheezing.   Cardiovascular: Negative for chest pain, palpitations and leg swelling.  Gastrointestinal: Negative for abdominal pain, nausea and vomiting.  Genitourinary: Negative for dysuria and flank pain.  Neurological: Negative for headaches.  All other systems reviewed and are negative.    Physical Exam Updated Vital Signs BP (!) 193/128 (BP Location: Right Arm) Comment: not prescribed bp medication  Pulse (!) 116   Temp 98.1 F  (36.7 C) (Oral)   Resp 16   Ht 5\' 6"  (1.676 m)   Wt 60.8 kg (134 lb)   LMP 08/09/2016 (Exact Date)   SpO2 100%   BMI 21.63 kg/m   Physical Exam  Constitutional: She is oriented to person, place, and time.  Chronically ill appearing   HENT:  Head: Normocephalic and atraumatic.  Right Ear: External ear normal.  Left Ear: External ear normal.  Mouth/Throat: No oropharyngeal exudate.  Eyes: Conjunctivae and EOM are normal. Pupils are equal, round, and reactive to light.  Neck: Normal range of motion. Neck supple. No JVD present. No tracheal deviation present.  Cardiovascular: Regular rhythm, normal heart sounds and intact distal pulses.  Tachycardia present.   Pulmonary/Chest: Effort normal and breath sounds normal. No stridor. No respiratory distress. She has no wheezes. She has no rales.  Abdominal: Soft. Bowel sounds are normal. She exhibits distension. She exhibits no mass. There is no tenderness. There is no rebound and no guarding.  Musculoskeletal: Normal range of motion. She exhibits no edema.  No pedal edema on exam   Lymphadenopathy:    She has no cervical adenopathy.  Neurological: She is alert and oriented to person, place, and time. She displays normal reflexes. No cranial nerve deficit or sensory deficit. She exhibits normal muscle tone. Coordination normal.  Skin: Skin is warm and dry.  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     ED Treatments / Results   Vitals:   08/12/16 2351  BP: (!) 193/128  Pulse: (!) 116  Resp: 16  Temp: 98.1 F (36.7 C)    Labs (all labs ordered are listed, but only abnormal results are displayed)  Results for orders placed or performed during the hospital encounter of 25/85/27  Basic metabolic panel  Result Value Ref Range   Sodium 135 135 - 145 mmol/L   Potassium 3.3 (L) 3.5 - 5.1 mmol/L   Chloride 103 101 - 111 mmol/L   CO2 23 22  - 32 mmol/L   Glucose, Bld 98 65 - 99 mg/dL   BUN 19 6 - 20 mg/dL   Creatinine, Ser 0.88 0.44 - 1.00 mg/dL   Calcium 9.3 8.9 - 10.3 mg/dL   GFR calc non Af Amer >60 >60 mL/min   GFR calc Af Amer >60 >60 mL/min   Anion gap 9 5 - 15  CBC  Result Value Ref Range   WBC 13.8 (H) 4.0 - 10.5 K/uL   RBC 3.35 (L) 3.87 - 5.11 MIL/uL   Hemoglobin 9.5 (L) 12.0 - 15.0 g/dL   HCT 29.4 (L) 36.0 - 46.0 %   MCV 87.8 78.0 - 100.0  fL   MCH 28.4 26.0 - 34.0 pg   MCHC 32.3 30.0 - 36.0 g/dL   RDW 20.3 (H) 11.5 - 15.5 %   Platelets 120 (L) 150 - 400 K/uL  Rapid urine drug screen (hospital performed)  Result Value Ref Range   Opiates NONE DETECTED NONE DETECTED   Cocaine NONE DETECTED NONE DETECTED   Benzodiazepines NONE DETECTED NONE DETECTED   Amphetamines NONE DETECTED NONE DETECTED   Tetrahydrocannabinol POSITIVE (A) NONE DETECTED   Barbiturates NONE DETECTED NONE DETECTED  TSH  Result Value Ref Range   TSH 6.435 (H) 0.350 - 4.500 uIU/mL  Blood gas, arterial ([facility] & [facility] ONLY)  Result Value Ref Range   FIO2 100.00    Delivery systems VENTILATOR    Mode PRESSURE REGULATED VOLUME CONTROL    VT 470 mL   LHR 15 resp/min   Peep/cpap 5.0 cm H20   pH, Arterial 7.107 (LL) 7.350 - 7.450   pCO2 arterial 24.2 (L) 32.0 - 48.0 mmHg   pO2, Arterial 464 (H) 83.0 - 108.0 mmHg   Bicarbonate 7.3 (L) 20.0 - 28.0 mmol/L   Acid-base deficit 20.7 (H) 0.0 - 2.0 mmol/L   O2 Saturation 99.3 %   Patient temperature 98.6    Collection site LEFT BRACHIAL    Drawn by [id number]    Sample type ARTERIAL DRAW   Acetaminophen level  Result Value Ref Range   Acetaminophen (Tylenol), Serum <10 (L) 10 - 30 ug/mL  Salicylate level  Result Value Ref Range   Salicylate Lvl <2.6 2.8 - 30.0 mg/dL  I-Stat beta hCG blood, ED  Result Value Ref Range   I-stat hCG, quantitative <5.0 <5 mIU/mL   Comment 3          POCT i-Stat troponin I  Result Value Ref Range   Troponin i, poc 0.16 (HH) 0.00 - 0.08 ng/mL   Comment NOTIFIED  PHYSICIAN    Comment 3           Dg Chest 2 View  Result Date: 08/18/2016 CLINICAL DATA:  Initial evaluation for acute shortness of breath. EXAM: CHEST  2 VIEW COMPARISON:  Prior radiograph from 05/02/2016. FINDINGS: Severe cardiomegaly, mildly worsened relative to prior. Possible pericardial fusion could be considered. Mediastinal silhouette within normal limits. Lungs normally inflated. No focal infiltrate, pulmonary edema, or pleural effusion. No pneumothorax. No acute osseus abnormality.  Mild scoliosis noted. IMPRESSION: 1. Severe cardiomegaly, mildly worsened from previous. Possible pericardial effusion could be considered. 2. No other active cardiopulmonary disease. Electronically Signed   By: Jeannine Boga M.D.   On: 08/16/2016 00:44   Ct Head Wo Contrast  Result Date: 08/12/2016 CLINICAL DATA:  Elevated blood pressure, numbness in the left hand EXAM: CT HEAD WITHOUT CONTRAST TECHNIQUE: Contiguous axial images were obtained from the base of the skull through the vertex without intravenous contrast. COMPARISON:  08/03/2009 FINDINGS: Brain: No evidence of acute infarction, hemorrhage, hydrocephalus, extra-axial collection or mass lesion/mass effect. Vascular: No hyperdense vessel or unexpected calcification. Skull: Normal. Negative for fracture or focal lesion. Sinuses/Orbits: Minimal mucosal thickening in the ethmoid sinuses. No acute orbital abnormality. Other: None IMPRESSION: No CT evidence for acute intracranial abnormality Electronically Signed   By: Donavan Foil M.D.   On: 09/03/2016 03:59   Dg Chest Portable 1 View  Result Date: 08/09/2016 CLINICAL DATA:  Post intubation EXAM: PORTABLE CHEST 1 VIEW COMPARISON:  08/16/2016, 05/02/2016 FINDINGS: Interval intubation, tip of the endotracheal tube is approximately 2.3 cm superior to the carina. Esophageal  tube tip is below the diaphragm but is not included. Moderate-to-marked cardiomegaly with globular cardiac configuration. Increased hazy  perihilar opacities suspicious for vascular congestion and edema. No pneumothorax. IMPRESSION: 1. Endotracheal tube tip approximately 2.3 cm superior to the carina 2. Moderate to marked cardiomegaly with globular cardiac configuration, consider pericardial effusion 3. Development of hazy bilateral pulmonary opacity suspicious for vascular congestion and pulmonary edema Electronically Signed   By: Donavan Foil M.D.   On: 08/20/2016 03:43   Ct Angio Chest/abd/pel For Dissection W And/or Wo Contrast  Result Date: 08/15/2016 CLINICAL DATA:  31 year old female with elevated blood pressure for several days presenting with left and and bilateral lower extremity numbness. Patient has a history of autoimmune hemolytic anemia and splenectomy in 2012. EXAM: CT ANGIOGRAPHY CHEST, ABDOMEN AND PELVIS TECHNIQUE: Multidetector CT imaging through the chest, abdomen and pelvis was performed using the standard protocol during bolus administration of intravenous contrast. Multiplanar reconstructed images and MIPs were obtained and reviewed to evaluate the vascular anatomy. CONTRAST:  100 cc Isovue 370 COMPARISON:  Chest radiograph dated 08/18/2016 and CT dated 05/02/2016 FINDINGS: CTA CHEST FINDINGS Cardiovascular: There is stable moderate cardiomegaly. There is a large pericardial effusion, increased compared to the prior study measuring approximately 3 cm in greatest axial thickness. The pericardial fluid demonstrates lower attenuation, likely simple or minimally complex fluid. There is retrograde flow of contrast from the right atrium into the IVC consistent with right cardiac dysfunction. Correlation with clinical exam and echocardiogram recommended. The thoracic aorta is unremarkable. The origins of the great vessels of the aortic arch appear patent. The central pulmonary artery is appear patent. Mediastinum/Nodes: No hilar or mediastinal adenopathy. An enteric tube is noted extending along the esophagus and terminating in the  body of the stomach. Lungs/Pleura: Evaluation of the lungs is somewhat limited due to respiratory motion artifact. There are small scattered pneumatoceles. There is diffuse interstitial prominence and ground-glass airspace opacity, likely interstitial and alveolar edema. Patchy areas of increased density primarily involving the lower lobes as well as a small area of density in the right upper lobe along the pleura may be related to edema or represent superimposed pneumonia. Clinical correlation is recommended. Small amount of fluid noted layering along the seizures. No large pleural effusion. There is no pneumothorax. The central airways are patent. An endotracheal tube remains above the carina. Musculoskeletal: Large left axillary lymph nodes measures up to 1.5 cm in short axis similar to prior study. Top-normal retropectoral lymph nodes appear improved compared to the prior study. There is mild diffuse soft tissue edema. No acute osseous pathology. Review of the MIP images confirms the above findings. CTA ABDOMEN AND PELVIS FINDINGS VASCULAR Aorta: Normal caliber aorta without aneurysm, dissection, vasculitis or significant stenosis. Celiac: Patent without evidence of aneurysm, dissection, vasculitis or significant stenosis. SMA: Patent without evidence of aneurysm, dissection, vasculitis or significant stenosis. Renals: Both renal arteries are patent without evidence of aneurysm, dissection, vasculitis, fibromuscular dysplasia or significant stenosis. IMA: Patent without evidence of aneurysm, dissection, vasculitis or significant stenosis. Inflow: Patent without evidence of aneurysm, dissection, vasculitis or significant stenosis. Veins: No obvious venous abnormality within the limitations of this arterial phase study. Review of the MIP images confirms the above findings. NON-VASCULAR No intra-abdominal free air.  Small free fluid within the pelvis. Hepatobiliary: Mild enlargement of the liver. No intrahepatic  biliary ductal dilatation. No calcified gallstone. Trace pericholecystic fluid may be present. Pancreas: Unremarkable. No pancreatic ductal dilatation or surrounding inflammatory changes. Spleen: Splenectomy. Multiple small splenules  noted inferior to the splenectomy bed. Adrenals/Urinary Tract: The adrenal glands appear unremarkable. There is heterogeneously appearing enhancing kidneys with serrated parenchymal enhancement concerning for pyelonephritis. Correlation with clinical exam and urinalysis recommended. Evaluation of the kidneys is however limited as nephrographic phase images are not provided. No fluid collection. No hydronephrosis. The urinary bladder is grossly unremarkable. Stomach/Bowel: An enteric tube is seen with tip in the body of the stomach. The stomach is somewhat decompressed. There are multiple dilated loops of small bowel 4.5 cm in diameter. No discrete transition zone identified. Findings may represent a functional ileus however, a distal small bowel obstruction is not entirely excluded. The colon is predominantly collapsed. The appendix is not visualized with certainty. No inflammatory changes identified in the right lower quadrant. Lymphatic: Bilateral iliac chain adenopathy measuring up to 19 mm in short axis. Mildly enlarged bilateral inguinal lymph nodes. This findings are relatively similar or slightly improved compared to the prior CT. Reproductive: The uterus and ovaries are grossly unremarkable. Other: Diffuse subcutaneous soft tissue edema.  No fluid collection. Musculoskeletal: No acute or significant osseous findings. Review of the MIP images confirms the above findings. IMPRESSION: 1. Stable cardiomegaly. There is a large pericardial effusion which has increased compared to the prior studies with findings of right cardiac dysfunction. Correlation with clinical exam and echocardiogram recommended. 2. No CT evidence of aortic dissection or aneurysm. No central pulmonary artery  embolus. 3. Vascular congestion and pulmonary edema. Bilateral lower lobe airspace opacities may represent alveolar edema, atelectasis, or infiltrate. 4. Scattered cystic changes of the lungs similar to the study of 12/09/2015. 5. Left axillary, subpectoral, and bilateral iliac chain adenopathy, similar or slightly improved compared to the prior CTs. 6. Heterogeneous enhancement of the kidneys concerning for pyelonephritis. Correlation with clinical exam and urinalysis recommended. 7. Diffusely dilated small bowel may represent ileus. A distal small bowel obstruction is less likely but not excluded. 8. Diffuse subcutaneous edema and anasarca. These results were called by telephone at the time of interpretation on 08/27/2016 at 5:04 am to physician assistant Quincy Carnes , who verbally acknowledged these results. Electronically Signed   By: Anner Crete M.D.   On: 08/05/2016 05:05    EKG  EKG Interpretation  Date/Time:  Monday August 13 2016 00:41:06 EDT Ventricular Rate:  109 PR Interval:    QRS Duration: 67 QT Interval:  356 QTC Calculation: 480 R Axis:   -56 Text Interpretation:  Sinus tachycardia Nonspecific T abnormalities, lateral leads Confirmed by Randal Buba, Lylie Blacklock ([id number]) on 08/12/2016 1:04:01 AM       Radiology Dg Chest 2 View  Result Date: 09/01/2016 CLINICAL DATA:  Initial evaluation for acute shortness of breath. EXAM: CHEST  2 VIEW COMPARISON:  Prior radiograph from 05/02/2016. FINDINGS: Severe cardiomegaly, mildly worsened relative to prior. Possible pericardial fusion could be considered. Mediastinal silhouette within normal limits. Lungs normally inflated. No focal infiltrate, pulmonary edema, or pleural effusion. No pneumothorax. No acute osseus abnormality.  Mild scoliosis noted. IMPRESSION: 1. Severe cardiomegaly, mildly worsened from previous. Possible pericardial effusion could be considered. 2. No other active cardiopulmonary disease. Electronically Signed   By: Jeannine Boga M.D.   On: 08/09/2016 00:44    Procedures Procedure Name: Intubation Date/Time: 08/30/2016 5:08 AM Performed by: Veatrice Kells Pre-anesthesia Checklist: Patient identified Oxygen Delivery Method: Ambu bag Preoxygenation: Pre-oxygenation with 100% oxygen Intubation Type: Cricoid Pressure applied Ventilation: Mask ventilation without difficulty Laryngoscope Size: Glidescope and 4 Grade View: Grade III Tube size: 7.0 mm Airway Equipment and Method:  Patient positioned with wedge pillow Placement Confirmation: ETT inserted through vocal cords under direct vision,  Positive ETCO2,  CO2 detector and Breath sounds checked- equal and bilateral       Medications Ordered in ED Medications  fentaNYL (SUBLIMAZE) 2,500 mcg in sodium chloride 0.9 % 250 mL (10 mcg/mL) infusion (25 mcg/hr Intravenous New Bag/Given 08/09/2016 0445)  fentaNYL (SUBLIMAZE) bolus via infusion 50 mcg (not administered)  0.9 %  sodium chloride infusion (not administered)  heparin injection 5,000 Units (not administered)  pantoprazole (PROTONIX) injection 40 mg (not administered)  iopamidol (ISOVUE-370) 76 % injection 100 mL (100 mLs Intravenous Contrast Given 08/12/2016 0357)  fentaNYL (SUBLIMAZE) injection 50 mcg (50 mcg Intravenous Given 08/08/2016 0439)   Cardiopulmonary Resuscitation (CPR) Procedure Note Directed/Performed by: Carlisle Beers I personally directed ancillary staff and/or performed CPR in an effort to regain return of spontaneous circulation and to maintain cardiac, neuro and systemic perfusion.  Called to room for bradycardia.  Patient went into PEA arrest, I was at the bedside.  Immediate CPR started immediately, continuous CPR.  One round of EPI administered with return to circulation patient in sinus tachycardia and hypertension noted   MDM Reviewed: previous chart, nursing note and vitals Reviewed previous: CT scan and labs Interpretation: labs, ECG and x-ray (elevated troponin,  cardiomegaly no CHF nor PNA on cxr) Total time providing critical care: 30-74 minutes. This excludes time spent performing separately reportable procedures and services. Consults: cardiology and critical care (case d/w Cardiology fellow, discuss with Dr. Tamala Julian.  D/w Dr. Tamala Julian, no cardiac catheterization tonight.  Cycle cardiac enzymes)  CRITICAL CARE Performed by: Carlisle Beers Total critical care time: 61 minutes Critical care time was exclusive of separately billable procedures and treating other patients. Critical care was necessary to treat or prevent imminent or life-threatening deterioration. Critical care was time spent personally by me on the following activities: development of treatment plan with patient and/or surrogate as well as nursing, discussions with consultants, evaluation of patient's response to treatment, examination of patient, obtaining history from patient or surrogate, ordering and performing treatments and interventions, ordering and review of laboratory studies, ordering and review of radiographic studies, pulse oximetry and re-evaluation of patient's condition.  Patient received < 200 ml of NSS in the ED and had no signs of peripheral edema.  Patient purposefully squeezed my hand on command x2 following intubation witnessed by respiratory and nursing at bedside.     Final Clinical Impressions(s) / ED Diagnoses  Cardiac arrest:  Patient had no signs of tamponade on exam nor EKG.  I immediately ordered a head CT to look for signs of cerebral changes related to HTN. Given enlarged heart see on CXR angio chest abdomen and pelvis was ordered to exclude dissection.      Patient has been followed by oncology for lymphadenopathy and weight loss but neither husband nor patient reported this to me. She had a pericardial effusion on previous CT with cardiomegaly and cystic lung disease.  EDP asked about skin changes as patient appears to have some degree of scleroderma but  husband states patient has eczema and does not have any additional rheumatologic diagnoses.  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 Today's CT scan is showing many of the same conditions that have been seen on 2 previous CT scans but have progressed.    I spoke with husband regarding patient's PMH following review of her CTs scans and previous  records and he states and asked more about her weight loss and LAN from previous records and he states he knew of this but did not mention it. I asked him about the abnormal CT on 12/09/2015 he stated that they were told the patient's lymph nodes were stable but he denies any further work up of visit to any specialists of any kind. Based on chart review, patient was referred to pulmonology based on 12/09/15 CT and was referred (based on her AVS) following her 04/2016 visit to the ED.  He states she has not been seen by cardiology or pulmonology as specified as referrals in previous notes.  Admitted to the ICU.      Konni Kesinger, MD 08/31/2016 [phone]

## 2016-08-13 NOTE — Procedures (Signed)
Arterial Line Insertion Procedure Note Casey Chapman [id number] 04/14/1985  Procedure: Insertion of right femoral arterial line Indications: Possible IABP placement for shock  Procedure Details Consent: Unable to obtain consent because of emergent medical necessity. Time out performed.  Maximum sterile technique was used including antiseptics, cap, gloves, gown, hand hygiene, mask and sheet. Skin prep: Chlorhexidine; local anesthetic administered A 5FR arterial sheath was placed in the left femoral artery using the Seldinger technique and u/s guidance with assistance from Dr. Ellyn Hack.  Evaluation Blood flow good Complications: No apparent complications   [name] MD 08/23/2016, 11:26 PM

## 2016-08-13 NOTE — Progress Notes (Signed)
Pt transported to CT on [facility] and back to ED without complication.  RT to monitor and assess as needed.

## 2016-08-13 NOTE — ED Notes (Signed)
Writer notified EDP of abnormal I stat trop result 

## 2016-08-13 NOTE — Consult Note (Signed)
Advanced Heart Failure Team Consult Note   Primary Physician: Primary Cardiologist: None   Hematology: Dr Burr Medico  Reason for Consultation: Acute Systolic Heart Failure   HPI:    Casey Chapman is seen today for evaluation of Acute Systolic  at the request of Dr Ellyn Hack.  Patient is encephalopathic and or intubated. Therefore history has been obtained from chart review.   Casey Chapman is a 31 year old with h/o hemolytic anemia, S/P splenectomy admitted with HTN to [facility]. In the ED she was given labetalol with subsequent cardiac arrest with ROSC after ~2 minutes. Post arrest hypertensive and placed on NTG drip but was later swtiched to cardene drip. BP went down to the 120s and quickly down to the 50s. This was later stopped and milrinone was started at and norepi 2 mcg. SBP went back up to 190 so norepi stopped. CO-OX 50% and Troponin went up form 6  to 42. Autoimmune panel pending. Febrile 104.  Arrived to [facility] via [place], had PEA arrest in route. Bedside swan and IABP placed.   ECHO  08/12/2016- EF 20-25%. RV severely reduced.   CTA  1. Stable cardiomegaly. There is a large pericardial effusion which has increased compared to the prior studies with findings of right cardiac dysfunction. Correlation with clinical exam and echocardiogram recommended. 2. No CT evidence of aortic dissection or aneurysm. No central pulmonary artery embolus. 3. Vascular congestion and pulmonary edema. Bilateral lower lobe airspace opacities may represent alveolar edema, atelectasis, or infiltrate. 4. Scattered cystic changes of the lungs similar to the study of 12/09/2015. 5. Left axillary, subpectoral, and bilateral iliac chain adenopathy, similar or slightly improved compared to the prior CTs. 6. Heterogeneous enhancement of the kidneys concerning for pyelonephritis. Correlation with clinical exam and urinalysis recommended. 7. Diffusely dilated small bowel may represent ileus. A distal  small bowel obstruction is less likely but not excluded. 8. Diffuse subcutaneous edema and anasarca.   Review of Systems: [y] = yes, '[ ]'$  = no Patient is encephalopathic and or intubated. Therefore history has been obtained from chart review.   General: Weight gain '[ ]'$ ; Weight loss Blue.Reese ]; Anorexia '[ ]'$ ; Fatigue '[]'$ ; Fever Blue.Reese ]; Chills '[ ]'$ ; Weakness [ y]  Cardiac: Chest pain/pressure '[ ]'$ ; Resting SOB '[ ]'$ ; Exertional SOB '[ ]'$ ; Orthopnea '[ ]'$ ; Pedal Edema Blue.Reese ]; Palpitations '[ ]'$ ; Syncope '[ ]'$ ; Presyncope '[ ]'$ ; Paroxysmal nocturnal dyspnea'[ ]'$   Pulmonary: Cough '[ ]'$ ; Wheezing'[ ]'$ ; Hemoptysis'[ ]'$ ; Sputum '[ ]'$ ; Snoring '[ ]'$   GI: Vomiting'[ ]'$ ; Dysphagia'[ ]'$ ; Melena'[ ]'$ ; Hematochezia '[ ]'$ ; Heartburn'[ ]'$ ; Abdominal pain '[ ]'$ ; Constipation '[ ]'$ ; Diarrhea '[ ]'$ ; BRBPR '[ ]'$   GU: Hematuria'[ ]'$ ; Dysuria '[ ]'$ ; Nocturia'[ ]'$   Vascular: Pain in legs with walking '[ ]'$ ; Pain in feet with lying flat '[ ]'$ ; Non-healing sores '[ ]'$ ; Stroke '[ ]'$ ; TIA '[ ]'$ ; Slurred speech '[ ]'$ ;  Neuro: Headaches'[ ]'$ ; Vertigo'[ ]'$ ; Seizures'[ ]'$ ; Paresthesias'[ ]'$ ;Blurred vision '[ ]'$ ; Diplopia '[ ]'$ ; Vision changes '[ ]'$   Ortho/Skin: Arthritis '[ ]'$ ; Joint pain '[ ]'$ ; Muscle pain '[ ]'$ ; Joint swelling '[ ]'$ ; Back Pain '[ ]'$ ; Rash '[ ]'$   Psych: Depression'[ ]'$ ; Anxiety'[ ]'$   Heme: Bleeding problems '[ ]'$ ; Clotting disorders '[ ]'$ ; Anemia '[ ]'$   Endocrine: Diabetes '[ ]'$ ; Thyroid dysfunction'[ ]'$   Home Medications Prior to Admission medications   Medication Sig Start Date End Date Taking? Authorizing Provider  acetaminophen (TYLENOL) 500 MG tablet Take 500-1,000 mg by mouth  every 6 (six) hours as needed for mild pain, moderate pain, fever or headache. pain    Yes [provider]  furosemide (LASIX) 20 MG tablet Take 1 tablet (20 mg total) by mouth daily. Patient taking differently: Take 20 mg by mouth daily as needed for edema.  05/02/16  Yes Forde Dandy, MD  hydrocortisone cream 0.5 % Apply 1 application topically 2 (two) times daily as needed (for itching on arms/legs).    Yes [provider]  ibuprofen (ADVIL,MOTRIN) 200 MG tablet Take 400-800 mg by mouth every 6 (six) hours as needed for fever, headache, mild pain, moderate pain or cramping.    Yes [provider]    Past Medical History: Past Medical History:  Diagnosis Date  . Chlamydia 04/16/12   Treated   . Eczema   . Hemolytic anemia ([place]) 2012   Autoimmune     Past Surgical History: Past Surgical History:  Procedure Laterality Date  . SPLENECTOMY  2012   . TONSILLECTOMY  1990    Family History: Family History  Problem Relation Age of Onset  . Diabetes Mother   . Hypertension Mother   . Depression Mother   . Diabetes Brother   . Diabetes Maternal Grandmother   . Diabetes Paternal Grandmother   . Hypertension Paternal Grandmother   . Thyroid disease Father        Thyroid removed    Social History: Social History   Social History  . Marital status: Single    Spouse name: N/A  . Number of children: N/A  . Years of education: N/A   Social History Main Topics  . Smoking status: Never Smoker  . Smokeless tobacco: Never Used  . Alcohol use 0.0 oz/week     Comment: Socially  . Drug use: No  . Sexual activity: Yes    Partners: Male   Other Topics Concern  . None   Social History Narrative  . None    Allergies:  No Known Allergies  Objective:    Vital Signs:   Temp:  [97.8 F (36.6 C)-104 F (40 C)] 104 F (40 C) (07/09 1330) Pulse Rate:  [28-157] 155 (07/09 1330) Resp:  [10-39] 30 (07/09 1330) BP: (85-196)/(12-162) 98/78 (07/09 1330) SpO2:  [69 %-100 %] 91 % (07/09 1400) FiO2 (%):  [40 %-100 %] 100 % (07/09 1228) Weight:  [130 lb 15.3 oz (59.4 kg)-134 lb (60.8 kg)] 130 lb 15.3 oz (59.4 kg) (07/09 0604) Last BM Date: 08/18/2016  Weight change: Filed Weights   08/12/16 2351 08/31/2016 0604  Weight: 134 lb (60.8 kg) 130 lb 15.3 oz (59.4 kg)    Intake/Output:   Intake/Output Summary (Last 24 hours) at 08/25/2016 1449 Last data filed at 08/31/2016 1000  Gross  per 24 hour  Intake           260.51 ml  Output               80 ml  Net           180.51 ml      Physical Exam    General: Intubated female.  HEENT: normal Neck: supple. JVP to jaw . Carotids 2+ bilat; no bruits. No lymphadenopathy or thyromegaly appreciated. Cor: PMI nondisplaced. Regular rate & rhythm. No rubs, gallops or murmurs. Lungs: Diffuse rhonchi. Intubated.  Abdomen: soft, nontender, nondistended. No hepatosplenomegaly. No bruits or masses. Good bowel sounds. Extremities: no cyanosis, clubbing, rash. Trace pedal edema.  Neuro: Intubated, sedated, cranial nerves grossly  intact. moves all 4 extremities w/o difficulty. Affect pleasant   Telemetry   Sinus Tach   EKG    Sinus Tach low volts.   Labs   Basic Metabolic Panel:  Recent Labs Lab 08/16/2016 0028 08/20/2016 0631 08/26/2016 0952  NA 135 134* 134*  K 3.3* 5.1 5.6*  CL 103 99* 100*  CO2 23 15* 13*  GLUCOSE 98 88 131*  BUN 19 26* 30*  CREATININE 0.88 1.46* 1.77*  CALCIUM 9.3 8.2* 8.0*  MG  --  2.6* 2.4  PHOS  --  10.6* 9.8*     CBC:  Recent Labs Lab 09/04/2016 0028 08/31/2016 0631 08/15/2016 0952  WBC 13.8* 13.6* 16.5*  HGB 9.5* 8.2* 8.6*  HCT 29.4* 26.4* 26.8*  MCV 87.8 91.0 90.2  PLT 120* 64* 40*    Cardiac Enzymes:  Recent Labs Lab 08/11/2016 0631 08/29/2016 0952  TROPONINI 7.21* 42.94*    BNP: BNP (last 3 results)  Recent Labs  05/02/16 1740  BNP 131.6*     CBG:  Recent Labs Lab 09/03/2016 0752 08/09/2016 0755 08/29/2016 0831 09/03/2016 1156 09/03/2016 1225  GLUCAP <10* 42* 112* <10* 83      Imaging   Dg Chest 2 View  Result Date: 09/02/2016 CLINICAL DATA:  Initial evaluation for acute shortness of breath. EXAM: CHEST  2 VIEW COMPARISON:  Prior radiograph from 05/02/2016. FINDINGS: Severe cardiomegaly, mildly worsened relative to prior. Possible pericardial fusion could be considered. Mediastinal silhouette within normal limits. Lungs normally inflated. No focal infiltrate,  pulmonary edema, or pleural effusion. No pneumothorax. No acute osseus abnormality.  Mild scoliosis noted. IMPRESSION: 1. Severe cardiomegaly, mildly worsened from previous. Possible pericardial effusion could be considered. 2. No other active cardiopulmonary disease. Electronically Signed   By: Jeannine Boga M.D.   On: 08/27/2016 00:44   Ct Head Wo Contrast  Result Date: 08/22/2016 CLINICAL DATA:  Elevated blood pressure, numbness in the left hand EXAM: CT HEAD WITHOUT CONTRAST TECHNIQUE: Contiguous axial images were obtained from the base of the skull through the vertex without intravenous contrast. COMPARISON:  08/03/2009 FINDINGS: Brain: No evidence of acute infarction, hemorrhage, hydrocephalus, extra-axial collection or mass lesion/mass effect. Vascular: No hyperdense vessel or unexpected calcification. Skull: Normal. Negative for fracture or focal lesion. Sinuses/Orbits: Minimal mucosal thickening in the ethmoid sinuses. No acute orbital abnormality. Other: None IMPRESSION: No CT evidence for acute intracranial abnormality Electronically Signed   By: Donavan Foil M.D.   On: 08/12/2016 03:59   Dg Chest Port 1 View  Result Date: 08/12/2016 CLINICAL DATA:  31 year old female with central line and enteric tube placement. EXAM: PORTABLE CHEST 1 VIEW COMPARISON:  Chest CT dated 08/31/2016 FINDINGS: Interval placement of a right IJ central line with tip over central SVC close to the cavoatrial junction. Endotracheal tube with tip approximately 4 cm above the carina and enteric tube extending into the left upper abdomen. Moderate enlargement of the cardiopericardial silhouette consistent with cardiomegaly and pericardial effusion. There is prominence of the vasculature. No significant pleural effusion. No pneumothorax. No acute osseous pathology. IMPRESSION: 1. Right IJ central line with tip over central SVC close to the cavoatrial junction. No pneumothorax. 2. Endotracheal tube above the carina and  enteric tube likely within the stomach. 3. Moderate enlargement of the cardiopericardial silhouette with vascular congestion. Electronically Signed   By: Anner Crete M.D.   On: 08/06/2016 06:56   Dg Chest Portable 1 View  Result Date: 08/15/2016 CLINICAL DATA:  Post intubation EXAM: PORTABLE CHEST 1 VIEW  COMPARISON:  08/08/2016, 05/02/2016 FINDINGS: Interval intubation, tip of the endotracheal tube is approximately 2.3 cm superior to the carina. Esophageal tube tip is below the diaphragm but is not included. Moderate-to-marked cardiomegaly with globular cardiac configuration. Increased hazy perihilar opacities suspicious for vascular congestion and edema. No pneumothorax. IMPRESSION: 1. Endotracheal tube tip approximately 2.3 cm superior to the carina 2. Moderate to marked cardiomegaly with globular cardiac configuration, consider pericardial effusion 3. Development of hazy bilateral pulmonary opacity suspicious for vascular congestion and pulmonary edema Electronically Signed   By: Donavan Foil M.D.   On: 08/18/2016 03:43   Ct Angio Chest/abd/pel For Dissection W And/or Wo Contrast  Result Date: 09/04/2016 CLINICAL DATA:  31 year old female with elevated blood pressure for several days presenting with left and and bilateral lower extremity numbness. Patient has a history of autoimmune hemolytic anemia and splenectomy in 2012. EXAM: CT ANGIOGRAPHY CHEST, ABDOMEN AND PELVIS TECHNIQUE: Multidetector CT imaging through the chest, abdomen and pelvis was performed using the standard protocol during bolus administration of intravenous contrast. Multiplanar reconstructed images and MIPs were obtained and reviewed to evaluate the vascular anatomy. CONTRAST:  100 cc Isovue 370 COMPARISON:  Chest radiograph dated 08/18/2016 and CT dated 05/02/2016 FINDINGS: CTA CHEST FINDINGS Cardiovascular: There is stable moderate cardiomegaly. There is a large pericardial effusion, increased compared to the prior study  measuring approximately 3 cm in greatest axial thickness. The pericardial fluid demonstrates lower attenuation, likely simple or minimally complex fluid. There is retrograde flow of contrast from the right atrium into the IVC consistent with right cardiac dysfunction. Correlation with clinical exam and echocardiogram recommended. The thoracic aorta is unremarkable. The origins of the great vessels of the aortic arch appear patent. The central pulmonary artery is appear patent. Mediastinum/Nodes: No hilar or mediastinal adenopathy. An enteric tube is noted extending along the esophagus and terminating in the body of the stomach. Lungs/Pleura: Evaluation of the lungs is somewhat limited due to respiratory motion artifact. There are small scattered pneumatoceles. There is diffuse interstitial prominence and ground-glass airspace opacity, likely interstitial and alveolar edema. Patchy areas of increased density primarily involving the lower lobes as well as a small area of density in the right upper lobe along the pleura may be related to edema or represent superimposed pneumonia. Clinical correlation is recommended. Small amount of fluid noted layering along the seizures. No large pleural effusion. There is no pneumothorax. The central airways are patent. An endotracheal tube remains above the carina. Musculoskeletal: Large left axillary lymph nodes measures up to 1.5 cm in short axis similar to prior study. Top-normal retropectoral lymph nodes appear improved compared to the prior study. There is mild diffuse soft tissue edema. No acute osseous pathology. Review of the MIP images confirms the above findings. CTA ABDOMEN AND PELVIS FINDINGS VASCULAR Aorta: Normal caliber aorta without aneurysm, dissection, vasculitis or significant stenosis. Celiac: Patent without evidence of aneurysm, dissection, vasculitis or significant stenosis. SMA: Patent without evidence of aneurysm, dissection, vasculitis or significant  stenosis. Renals: Both renal arteries are patent without evidence of aneurysm, dissection, vasculitis, fibromuscular dysplasia or significant stenosis. IMA: Patent without evidence of aneurysm, dissection, vasculitis or significant stenosis. Inflow: Patent without evidence of aneurysm, dissection, vasculitis or significant stenosis. Veins: No obvious venous abnormality within the limitations of this arterial phase study. Review of the MIP images confirms the above findings. NON-VASCULAR No intra-abdominal free air.  Small free fluid within the pelvis. Hepatobiliary: Mild enlargement of the liver. No intrahepatic biliary ductal dilatation. No calcified gallstone. Trace  pericholecystic fluid may be present. Pancreas: Unremarkable. No pancreatic ductal dilatation or surrounding inflammatory changes. Spleen: Splenectomy. Multiple small splenules noted inferior to the splenectomy bed. Adrenals/Urinary Tract: The adrenal glands appear unremarkable. There is heterogeneously appearing enhancing kidneys with serrated parenchymal enhancement concerning for pyelonephritis. Correlation with clinical exam and urinalysis recommended. Evaluation of the kidneys is however limited as nephrographic phase images are not provided. No fluid collection. No hydronephrosis. The urinary bladder is grossly unremarkable. Stomach/Bowel: An enteric tube is seen with tip in the body of the stomach. The stomach is somewhat decompressed. There are multiple dilated loops of small bowel 4.5 cm in diameter. No discrete transition zone identified. Findings may represent a functional ileus however, a distal small bowel obstruction is not entirely excluded. The colon is predominantly collapsed. The appendix is not visualized with certainty. No inflammatory changes identified in the right lower quadrant. Lymphatic: Bilateral iliac chain adenopathy measuring up to 19 mm in short axis. Mildly enlarged bilateral inguinal lymph nodes. This findings are  relatively similar or slightly improved compared to the prior CT. Reproductive: The uterus and ovaries are grossly unremarkable. Other: Diffuse subcutaneous soft tissue edema.  No fluid collection. Musculoskeletal: No acute or significant osseous findings. Review of the MIP images confirms the above findings. IMPRESSION: 1. Stable cardiomegaly. There is a large pericardial effusion which has increased compared to the prior studies with findings of right cardiac dysfunction. Correlation with clinical exam and echocardiogram recommended. 2. No CT evidence of aortic dissection or aneurysm. No central pulmonary artery embolus. 3. Vascular congestion and pulmonary edema. Bilateral lower lobe airspace opacities may represent alveolar edema, atelectasis, or infiltrate. 4. Scattered cystic changes of the lungs similar to the study of 12/09/2015. 5. Left axillary, subpectoral, and bilateral iliac chain adenopathy, similar or slightly improved compared to the prior CTs. 6. Heterogeneous enhancement of the kidneys concerning for pyelonephritis. Correlation with clinical exam and urinalysis recommended. 7. Diffusely dilated small bowel may represent ileus. A distal small bowel obstruction is less likely but not excluded. 8. Diffuse subcutaneous edema and anasarca. These results were called by telephone at the time of interpretation on 08/24/2016 at 5:04 am to physician assistant Quincy Carnes , who verbally acknowledged these results. Electronically Signed   By: Anner Crete M.D.   On: 08/14/2016 05:05      Medications:     Current Medications: . chlorhexidine gluconate (MEDLINE KIT)  15 mL Mouth Rinse BID  . Chlorhexidine Gluconate Cloth  6 each Topical Q0600  . feeding supplement (PRO-STAT SUGAR FREE 64)  30 mL Per Tube Daily  . heparin  5,000 Units Subcutaneous Q8H  . mouth rinse  15 mL Mouth Rinse QID  . pantoprazole (PROTONIX) IV  40 mg Intravenous QHS     Infusions: . sodium chloride    . cefTRIAXone  (ROCEPHIN)  IV Stopped (08/31/2016 1246)  . dextrose 5 % and 0.45% NaCl 75 mL/hr at 08/30/2016 1000  . feeding supplement (VITAL 1.5 CAL)    . fentaNYL infusion INTRAVENOUS 100 mcg/hr (08/05/2016 1000)  . milrinone 0.25 mcg/kg/min (08/11/2016 1356)  . niCARDipine Stopped (08/11/2016 1042)  . nitroGLYCERIN Stopped (08/30/2016 0944)  . norepinephrine (LEVOPHED) Adult infusion 2 mcg/min (08/18/2016 1357)  . propofol (DIPRIVAN) infusion 10 mcg/kg/min (08/12/2016 1203)  .  sodium bicarbonate  infusion 1000 mL 125 mL/hr at 08/07/2016 1238       Patient Profile   Casey Chapman is a 31 year old with h/o hemolytic anemia, splenectomy admitted with HTN followed by cardiac  arrest. ECHO with biventricular failure and moderate pericardial effusion.   Assessment/Plan   1. Cardiac Arrest: - PEA x 3. Undergoing ACLS at the time of transfer.   2. Acute Respiratory Failure - Intubated.  - CCM following.   3. HTN Urgency: Initial presentation was hypertension. Started on labetalol gtt in ED, suffered PEA soon after.  - Was hypertensive upon arrival to [facility] (thus Norepi was not started).  - Now hypotensive, Start Norepi at 15 mcg per CCM, will titrate for map > 65.   4.Cardiogenic Shock: Echo with biventricular failure, EF 20%, RV function severely reduced.  - Initial co ox 50%, started on milrinone 0.20mg and 8 mcg Norepi.  - Low voltage QRS, Will send SPEP. UPEP pending.   - Undergoing Swan and IABP placement currently.  - C.O. 7, Wedge 14.   5. AKI- Creatinine trending up 0.8>1.4>1.7 - Follow with BMET  6. Elevated Troponin- 0.02>7.2>43   - Question myopericarditis.   7. Pericardial Effusion- Moderate on ECHO today.   8. Malnutrition- Prealbumin 14.3   9. Elevated WBC- WBC trending up 13>16.5 - Send blood cultures.    10. Lactic Acidosis-11.5   11. 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 2011.  12. Diffuse adenopathy - Follows with Dr. FBurr Medico(oncology). - Per last office note 11/2015: " -She has  several diffuse enlarged lymph nodes and some are palpable on exam. I reviewed her left axillary lymph node biopsy results with her, which was negative for malignant cells."    Length of Stay: 0[place] NP-C  4:31 PM  Advanced Heart Failure Team Pager [phone](M-F; 7a - 4p)  Please contact [email or web address] for night-coverage after hours (4p -7a ) and weekends on [email or web address]   Agree with above.  On arrival into 55M patient in process of coding again and being resuscitated by CCM. Bedside echo performed personally and showed moderate to large pericardial effusion mostly around RV, There was some diastolic compression of RA but no overt tamponade. Both RV and LV function severely reduced with troponin > 65 suggestive of acute, fulminant myopericarditis.   In combination with the CCM team/Dr. MLake Bellspatient was resuscitated with and epi drio and multiple rounds of bicarb. I placed an arterial sheath in the left femoral artery for ABG sampling and pressure monitoring. At the same time, Dr. MLake Bellsplaced a LIJ cordis. I floated the swan through the cordis and performed a RHC at the bedside to direct further management. With the assistance of Dr. HEllyn Hack I also placed a 5FR R femoral sheath under u/s guidance for possible IABP placement.   After review of hemodynamic data it was decided not to place mechanical support at the present time.   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 I called Dr. BAmil [facility] to discuss possible immune suppression and it was felt that high-dose steroids would be best plan currently for adrenal support and treatment of any possible CTD flare.   [facility].   Prognosis remains extremely poor. I do not think she is ECMO candidate at this point given comorbidities but can resonssider as her course unfolds.   CRITICAL CARE Performed by:  BGlori Bickers Total critical care time: 90 minutes  Critical care time was exclusive of separately billable procedures and treating other patients.  Critical care was necessary to treat or prevent imminent or life-threatening deterioration.  Critical care was time spent personally by me (independent of midlevel providers or residents) on the following activities: development of treatment plan with patient and/or surrogate as well as nursing, discussions with consultants, evaluation of patient's response to treatment, examination of patient, obtaining history from patient or surrogate, ordering and performing treatments and interventions, ordering and review of laboratory studies, ordering and review of radiographic studies, pulse oximetry and re-evaluation of patient's condition.  [name], MD  11:22 PM

## 2016-08-13 NOTE — Progress Notes (Signed)
PULMONARY / CRITICAL CARE MEDICINE   Name: Casey Chapman MRN: [id number] DOB: Sep 20, 1985    ADMISSION DATE:  08/22/2016 CONSULTATION DATE:  08/16/2016  REFERRING MD:  Dr. Nicanor Alcon   CHIEF COMPLAINT:  HTN   BRIEF SUMMARY:   31 year old female with PMH of autoimmune hemolytic anemia s/p splenectomy, being followed by Dr. Mosetta Putt in Hematology. Has reported 60 lb weight loss in past 2 years with history of weakness and swelling in lower extremity.   Patient Presented to ED on 7/8 with reported HTN. Reported going to urgent care on Friday, 7/6 with pain in hands/feet. During this visit patient was told that her BP was elevated and she needed to keep an eye on it and follow up with PCP. 7/8 patient noted her systolic to be 220s and was told by a friend to go to the ED. Upon arrival to ED BP was reported 220 systolic. Patient was then started on Labetalol gtt, shortly after patient had a brief cardiac arrest for 2 minutes. CT Head negative for acute process. CTA Chest revealed scattered cystic changes of the lungs and cardiomegaly with a large pericardial effusion which has increased in size compared to last exam. Patient was intubated during cardiac arrest. PCCM asked to admit.    SUBJECTIVE:  RN reports hypoglycemia with CBG's to 30's, elevated troponin & lactic acid.    VITAL SIGNS: BP (!) 188/160   Pulse (!) 144   Temp 98.1 F (36.7 C) (Oral)   Resp (!) 28   Ht 5\' 7"  (1.702 m)   Wt 130 lb 15.3 oz (59.4 kg)   LMP 08/09/2016 (Exact Date) Comment: negative beta HCG 08/27/2016  SpO2 100%   BMI 20.51 kg/m   HEMODYNAMICS:    VENTILATOR SETTINGS: Vent Mode: PRVC FiO2 (%):  [50 %-100 %] 50 % Set Rate:  [15 bmp] 15 bmp Vt Set:  [420 mL-470 mL] 470 mL PEEP:  [5 cmH20] 5 cmH20 Plateau Pressure:  [17 cmH20-21 cmH20] 17 cmH20  INTAKE / OUTPUT: I/O last 3 completed shifts: In: 9.7 [I.V.:9.7] Out: -   PHYSICAL EXAMINATION: General: chronically ill appearing female on vent  HEENT: MM pink/moist,  ETT, temporal wasting Neuro: sedate CV: s1s2 rrr, tachy, no m/r/g PULM: even/non-labored, lungs bilaterally coarse  10/14/16, non-tender, bsx4 active  Extremities: warm/dry, trace generalized edema  Skin: vitiligo on anterior chest wall   LABS:  BMET  Recent Labs Lab 08/17/2016 0028 08/18/2016 0631  NA 135 134*  K 3.3* 5.1  CL 103 99*  CO2 23 15*  BUN 19 26*  CREATININE 0.88 1.46*  GLUCOSE 98 88    Electrolytes  Recent Labs Lab 08/31/2016 0028 08/18/2016 0631  CALCIUM 9.3 8.2*  MG  --  2.6*  PHOS  --  10.6*    CBC  Recent Labs Lab 08/25/2016 0028 08/05/2016 0631  WBC 13.8* 13.6*  HGB 9.5* 8.2*  HCT 29.4* 26.4*  PLT 120* 64*    Coag's No results for input(s): APTT, INR in the last 168 hours.  Sepsis Markers  Recent Labs Lab 08/12/2016 0631  LATICACIDVEN 11.5*  PROCALCITON 3.08    ABG  Recent Labs Lab 08/10/2016 0425 08/20/2016 0635  PHART 7.107* 7.219*  PCO2ART 24.2* 25.7*  PO2ART 464* 178*    Liver Enzymes No results for input(s): AST, ALT, ALKPHOS, BILITOT, ALBUMIN in the last 168 hours.  Cardiac Enzymes  Recent Labs Lab 08/08/2016 0631  TROPONINI 7.21*    Glucose  Recent Labs Lab 09/02/2016 0750 08/27/2016 10/14/16  08/08/2016 0755  GLUCAP <10* <10* 42*    Imaging Dg Chest 2 View  Result Date: 09/02/2016 CLINICAL DATA:  Initial evaluation for acute shortness of breath. EXAM: CHEST  2 VIEW COMPARISON:  Prior radiograph from 05/02/2016. FINDINGS: Severe cardiomegaly, mildly worsened relative to prior. Possible pericardial fusion could be considered. Mediastinal silhouette within normal limits. Lungs normally inflated. No focal infiltrate, pulmonary edema, or pleural effusion. No pneumothorax. No acute osseus abnormality.  Mild scoliosis noted. IMPRESSION: 1. Severe cardiomegaly, mildly worsened from previous. Possible pericardial effusion could be considered. 2. No other active cardiopulmonary disease. Electronically Signed   By: Jeannine Boga  M.D.   On: 08/07/2016 00:44   Ct Head Wo Contrast  Result Date: 08/22/2016 CLINICAL DATA:  Elevated blood pressure, numbness in the left hand EXAM: CT HEAD WITHOUT CONTRAST TECHNIQUE: Contiguous axial images were obtained from the base of the skull through the vertex without intravenous contrast. COMPARISON:  08/03/2009 FINDINGS: Brain: No evidence of acute infarction, hemorrhage, hydrocephalus, extra-axial collection or mass lesion/mass effect. Vascular: No hyperdense vessel or unexpected calcification. Skull: Normal. Negative for fracture or focal lesion. Sinuses/Orbits: Minimal mucosal thickening in the ethmoid sinuses. No acute orbital abnormality. Other: None IMPRESSION: No CT evidence for acute intracranial abnormality Electronically Signed   By: Donavan Foil M.D.   On: 08/31/2016 03:59   Dg Chest Port 1 View  Result Date: 08/30/2016 CLINICAL DATA:  31 year old female with central line and enteric tube placement. EXAM: PORTABLE CHEST 1 VIEW COMPARISON:  Chest CT dated 08/26/2016 FINDINGS: Interval placement of a right IJ central line with tip over central SVC close to the cavoatrial junction. Endotracheal tube with tip approximately 4 cm above the carina and enteric tube extending into the left upper abdomen. Moderate enlargement of the cardiopericardial silhouette consistent with cardiomegaly and pericardial effusion. There is prominence of the vasculature. No significant pleural effusion. No pneumothorax. No acute osseous pathology. IMPRESSION: 1. Right IJ central line with tip over central SVC close to the cavoatrial junction. No pneumothorax. 2. Endotracheal tube above the carina and enteric tube likely within the stomach. 3. Moderate enlargement of the cardiopericardial silhouette with vascular congestion. Electronically Signed   By: Anner Crete M.D.   On: 08/05/2016 06:56   Dg Chest Portable 1 View  Result Date: 08/22/2016 CLINICAL DATA:  Post intubation EXAM: PORTABLE CHEST 1 VIEW  COMPARISON:  08/11/2016, 05/02/2016 FINDINGS: Interval intubation, tip of the endotracheal tube is approximately 2.3 cm superior to the carina. Esophageal tube tip is below the diaphragm but is not included. Moderate-to-marked cardiomegaly with globular cardiac configuration. Increased hazy perihilar opacities suspicious for vascular congestion and edema. No pneumothorax. IMPRESSION: 1. Endotracheal tube tip approximately 2.3 cm superior to the carina 2. Moderate to marked cardiomegaly with globular cardiac configuration, consider pericardial effusion 3. Development of hazy bilateral pulmonary opacity suspicious for vascular congestion and pulmonary edema Electronically Signed   By: Donavan Foil M.D.   On: 08/18/2016 03:43   Ct Angio Chest/abd/pel For Dissection W And/or Wo Contrast  Result Date: 08/31/2016 CLINICAL DATA:  31 year old female with elevated blood pressure for several days presenting with left and and bilateral lower extremity numbness. Patient has a history of autoimmune hemolytic anemia and splenectomy in 2012. EXAM: CT ANGIOGRAPHY CHEST, ABDOMEN AND PELVIS TECHNIQUE: Multidetector CT imaging through the chest, abdomen and pelvis was performed using the standard protocol during bolus administration of intravenous contrast. Multiplanar reconstructed images and MIPs were obtained and reviewed to evaluate the vascular anatomy. CONTRAST:  100  cc Isovue 370 COMPARISON:  Chest radiograph dated 08/11/2016 and CT dated 05/02/2016 FINDINGS: CTA CHEST FINDINGS Cardiovascular: There is stable moderate cardiomegaly. There is a large pericardial effusion, increased compared to the prior study measuring approximately 3 cm in greatest axial thickness. The pericardial fluid demonstrates lower attenuation, likely simple or minimally complex fluid. There is retrograde flow of contrast from the right atrium into the IVC consistent with right cardiac dysfunction. Correlation with clinical exam and echocardiogram  recommended. The thoracic aorta is unremarkable. The origins of the great vessels of the aortic arch appear patent. The central pulmonary artery is appear patent. Mediastinum/Nodes: No hilar or mediastinal adenopathy. An enteric tube is noted extending along the esophagus and terminating in the body of the stomach. Lungs/Pleura: Evaluation of the lungs is somewhat limited due to respiratory motion artifact. There are small scattered pneumatoceles. There is diffuse interstitial prominence and ground-glass airspace opacity, likely interstitial and alveolar edema. Patchy areas of increased density primarily involving the lower lobes as well as a small area of density in the right upper lobe along the pleura may be related to edema or represent superimposed pneumonia. Clinical correlation is recommended. Small amount of fluid noted layering along the seizures. No large pleural effusion. There is no pneumothorax. The central airways are patent. An endotracheal tube remains above the carina. Musculoskeletal: Large left axillary lymph nodes measures up to 1.5 cm in short axis similar to prior study. Top-normal retropectoral lymph nodes appear improved compared to the prior study. There is mild diffuse soft tissue edema. No acute osseous pathology. Review of the MIP images confirms the above findings. CTA ABDOMEN AND PELVIS FINDINGS VASCULAR Aorta: Normal caliber aorta without aneurysm, dissection, vasculitis or significant stenosis. Celiac: Patent without evidence of aneurysm, dissection, vasculitis or significant stenosis. SMA: Patent without evidence of aneurysm, dissection, vasculitis or significant stenosis. Renals: Both renal arteries are patent without evidence of aneurysm, dissection, vasculitis, fibromuscular dysplasia or significant stenosis. IMA: Patent without evidence of aneurysm, dissection, vasculitis or significant stenosis. Inflow: Patent without evidence of aneurysm, dissection, vasculitis or significant  stenosis. Veins: No obvious venous abnormality within the limitations of this arterial phase study. Review of the MIP images confirms the above findings. NON-VASCULAR No intra-abdominal free air.  Small free fluid within the pelvis. Hepatobiliary: Mild enlargement of the liver. No intrahepatic biliary ductal dilatation. No calcified gallstone. Trace pericholecystic fluid may be present. Pancreas: Unremarkable. No pancreatic ductal dilatation or surrounding inflammatory changes. Spleen: Splenectomy. Multiple small splenules noted inferior to the splenectomy bed. Adrenals/Urinary Tract: The adrenal glands appear unremarkable. There is heterogeneously appearing enhancing kidneys with serrated parenchymal enhancement concerning for pyelonephritis. Correlation with clinical exam and urinalysis recommended. Evaluation of the kidneys is however limited as nephrographic phase images are not provided. No fluid collection. No hydronephrosis. The urinary bladder is grossly unremarkable. Stomach/Bowel: An enteric tube is seen with tip in the body of the stomach. The stomach is somewhat decompressed. There are multiple dilated loops of small bowel 4.5 cm in diameter. No discrete transition zone identified. Findings may represent a functional ileus however, a distal small bowel obstruction is not entirely excluded. The colon is predominantly collapsed. The appendix is not visualized with certainty. No inflammatory changes identified in the right lower quadrant. Lymphatic: Bilateral iliac chain adenopathy measuring up to 19 mm in short axis. Mildly enlarged bilateral inguinal lymph nodes. This findings are relatively similar or slightly improved compared to the prior CT. Reproductive: The uterus and ovaries are grossly unremarkable. Other: Diffuse subcutaneous soft  tissue edema.  No fluid collection. Musculoskeletal: No acute or significant osseous findings. Review of the MIP images confirms the above findings. IMPRESSION: 1.  Stable cardiomegaly. There is a large pericardial effusion which has increased compared to the prior studies with findings of right cardiac dysfunction. Correlation with clinical exam and echocardiogram recommended. 2. No CT evidence of aortic dissection or aneurysm. No central pulmonary artery embolus. 3. Vascular congestion and pulmonary edema. Bilateral lower lobe airspace opacities may represent alveolar edema, atelectasis, or infiltrate. 4. Scattered cystic changes of the lungs similar to the study of 12/09/2015. 5. Left axillary, subpectoral, and bilateral iliac chain adenopathy, similar or slightly improved compared to the prior CTs. 6. Heterogeneous enhancement of the kidneys concerning for pyelonephritis. Correlation with clinical exam and urinalysis recommended. 7. Diffusely dilated small bowel may represent ileus. A distal small bowel obstruction is less likely but not excluded. 8. Diffuse subcutaneous edema and anasarca. These results were called by telephone at the time of interpretation on 08/09/2016 at 5:04 am to physician assistant Quincy Carnes , who verbally acknowledged these results. Electronically Signed   By: Anner Crete M.D.   On: 08/27/2016 05:05     STUDIES:  CTA Chest 05/02/16 >> Cardiomegaly with trace pericardial effusion for age. There is mild diffuse anasarca. Nonspecific retroclavicular, subpectoral, and bilateral axillary lymphadenopathy. Findings have been chronic unstable back to 2016 CT. No acute pulmonary embolus. Small hiatal hernia with fluid in the distal esophagus likely related to reflux.Scattered pulmonary blebs and bulla. No pneumonic consolidation, dominant mass, effusion or pneumothorax. CXR 7/9 >> Endotracheal tube tip approximately 2.3 cm superior to the carina. Moderate to marked cardiomegaly with globular cardiac configuration, consider pericardial effusion Development of hazy bilateral pulmonary opacity suspicious for vascular congestion and pulmonary  edema CT Head 7/9 >> No acute process CTA Chest / ABD 7/9 >> stable cardiomegaly, large pericardial effusion (increased since 04/2016), no evidenceof aortic dissection or aneurysm, no PE.  Vascular congestion and pulmonary edema, scattered cystic changes of the lung, heterogeneous enhancement of the kidneys concerning for pyelonephritis, diffusely dilated small bowel concerning for ileus, diffuse subcutaneous edema / anasarca UDS 7/9 >>   CULTURES: UA 7/9 >>   ANTIBIOTICS: Rocephin 7/9 >>   SIGNIFICANT EVENTS: 7/08  Presents to ED with HTN > arrested in ER  LINES/TUBES: ETT 7/9 >>  R IJ TLC 7/9 >>   AUTOIMMUNE 7/9  C3 >>  C4 >>  GBM >>  SSA >>  SSB >>  DSDNA >>  Anti-scleroderma >>  ANCA >>  ACE >>  RA >>  CRP >>  ESR >>  Phosphatidylserine antibodies >>  DISCUSSION: 31 year old female presents to ED with HTN. Patient denies symptoms other than chronic progressive weakness. While in ED started on labetalol gtt. After initiation had brief 2 minute cardiac arrest, requiring intubation.   ASSESSMENT / PLAN:  PULMONARY A: Respiratory Insufficiency in setting of Cardiac Arrest  - Chest 11/3 > mild cystic lung disease, unchanged from previous imaging, ?lymphagioleiomyomatosis Chronic Lung Cysts  P:   PRVC 8cc/kg  Wean PEEP / FiO2 for sats > 92% Follow CXR, ABG Daily WUA Follow autoimmune panel  CARDIOVASCULAR A:  HTN Urgency  Pericardial Effusion - increased in size since 04/2106 Cardiac Arrest - brief cardiac arrest for 2 minutes after starting labetalol gtt  ?Pulmonary HTN Elevated Troponin - post arrest P:  ICU monitoring  Await ECHO findings  May need to go to [facility] for pericardial effusion drainage Change NTG to Cardene  SBP goal 140-160 Trend troponin  RENAL A:   AGMA  AKI P:   Trend BMP / urinary output Replace electrolytes as indicated Avoid nephrotoxic agents, ensure adequate renal perfusion  GASTROINTESTINAL A:   Small Bowel Ileus  - ? If  autoimmune Weight Loss - approx 60 lbs in last year, decreased appetite in 2 weeks prior to admit   P:   NPO / OGT Begin TF Assess pre-albumin  HEMATOLOGIC A:   Hemolytic Anemia s/p Splenectomy  -CT A/P in 2015 showed adenopathy E inguinal, external iliac -CT CAP in 2016 showed axilla, supraclavicular and subpectoral adenopathy > progressive since 2011 -Left Axially Lymph node biopsy in 2016 > 2 Benign lymph nodes, no malignancy  P:  Trend CBC  Monitor for bleeding   INFECTIOUS A:   Possible Pyelo - noted on CT ABD Possible Aspiration post Arrest  P:   Await UA Monitor fever curve / WBC trend  Trend lactic acid, PCT  Empiric Rocephin  ENDOCRINE A:   Hypoglycemia    Elevated  P:   D51/2NS at 41m/hr  Monitor glucose  NEUROLOGIC A:   Acute Encephalopathy in setting of sedation - reportedly woke post arrest\ THC Abuse  P:   RASS goal: 0 to -1 Fentanyl gtt for pain  Propofol for sedation  Frequent Neuro exams    FAMILY  - Updates: Boyfriend updated at bedside 7/9  - Inter-disciplinary family meet or Palliative Care meeting due by:  08/20/2016   CC Time: 417minutes    BNoe Gens NP-C [facility] Pgr: [phone] or if no answer [phone] 09/01/2016, 8:15 AM  Attending Note:  I have examined patient, reviewed labs, studies and notes. I have discussed the case with B Ollis, and I agree with the data and plans as amended above. 31yo woman with hx prior autoimmune hemolysis, splenectomy and LAD that has been followed w serial CT's by Oncology. Also with thin-walled bilateral cystic changes on CT chest, other manifestations suggestive of connective tissue disease. She was admitted with severe HTN, started on labetalol gtt, then sustained brief PEA and resuscitation. She was intubated and stabilized 7/9 early am. CXR and CT chest confirm a large pericardial effusion. Post-arrest she has a lactic acidosis and an evolving acute renal injury. On my  eval this am her BP has stabilized on nicardipine and sedation. She does not wake to voice. Her facial skin is tight including forehead, cheeks, eyes. She has hypopigmentation of her chest and fingers although no sclerodactyly. Heart sounds very distant, tachycardic and regular. Abdomen benign w positive BS. Lungs clear. At this time we will continue efforts to stabilize on MV, nicardipine. Start empiric ceftriaxone given her BLL atx vs infiltrate, possible pyelonephritis on CT scan. cx's pending - will adjust abx according to these results. TTE to better assess pericardial effusion - may benefit from pericardiocentesis for diagnostic purposes. Auto-immune labs are pending. Depending on her repeat renal fxn and injury we may need to involve renal consultants. Follow lactate, troponin and renal labs serially. Suspect that her troponin elevated due to arrest and demand. Her CT chest and cystic changes raises possibility of LAM vs other cystic pulm diseases (HSP, amyloid, LIP due to auto-immune disease, Langerhan's cell histiocytosis). Send UPEP.   Independent critical care time is 50 minutes.   RBaltazar Apo MD, PhD 09/03/2016, 9:49 AM [facility] [phone] if no answer [phone]

## 2016-08-13 NOTE — Procedures (Signed)
Arterial Line Insertion Procedure Note Casey Chapman [id number] 04-28-1985  Procedure: Insertion of left femoral arterial line Indications: Frequent blood sampling  Procedure Details Consent: Unable to obtain consent because of emergent medical necessity. Time out performed.  Maximum sterile technique was used including antiseptics, cap, gloves, gown, hand hygiene, mask and sheet. Skin prep: Chlorhexidine; local anesthetic administered A antimicrobial bonded/coated single lumen catheter was placed in the left femoral artery using the Seldinger technique.  Evaluation Blood flow good Complications: No apparent complications   [name] MD 08/24/2016, 11:26 PM

## 2016-08-13 NOTE — Progress Notes (Signed)
  Echocardiogram 2D Echocardiogram has been performed.  Casey Chapman 09/02/2016, 8:48 AM

## 2016-08-13 NOTE — Progress Notes (Signed)
CRITICAL VALUE ALERT  Critical Value: Lactic acid 12.5   Date & Time Notied:  08/18/2016 2114  Provider Notified: Dr. Oletta Darter   Orders Received/Actions taken:  [facility] RN with elink made aware.

## 2016-08-13 NOTE — ED Notes (Addendum)
Patient began to feel like she was having a panic attack just before labetalol was started. Patient placed on 3L o2 and continued to attempt to capture o2 saturation. Dr Randal Buba notified and at bedside. Patients hr dropped.  Atropine 1 mg given at 0247 and cpr started at 0248. Epi 1 amp given at 0249. Pulses returned and CPR stopped at 0254. 20 of Etomidate given at [facility]

## 2016-08-13 NOTE — Procedures (Signed)
Central Venous Catheter Insertion Procedure Note Myrlene Riera [id number] 02/24/1985  Procedure: Insertion of Central Venous Catheter Indications: Assessment of intravascular volume, Drug and/or fluid administration and Frequent blood sampling  Procedure Details Consent: Unable to obtain consent because of emergent medical necessity. Time Out: Verified patient identification, verified procedure, site/side was marked, verified correct patient position, special equipment/implants available, medications/allergies/relevent history reviewed, required imaging and test results available.  Performed  Maximum sterile technique was used including antiseptics, cap, gloves, gown, hand hygiene, mask and sheet. Skin prep: Chlorhexidine; local anesthetic administered A antimicrobial bonded/coated triple lumen catheter was placed in the right internal jugular vein using the Seldinger technique.  Evaluation Blood flow good Complications: No apparent complications Patient did tolerate procedure well. Chest X-ray ordered to verify placement.  CXR: pending.   Hayden Pedro, AGACNP-BC [facility]  Pgr: [phone]  PCCM Pgr: [phone]

## 2016-08-13 NOTE — ED Notes (Signed)
Difficulty obtaining and maintaining o2 sat reading.

## 2016-08-13 NOTE — Progress Notes (Signed)
Patient transported to 2H16 without any known complications.

## 2016-08-13 NOTE — Progress Notes (Signed)
Multiple critical values received, values repeated back to NP and MD while on the unit.    CRITICAL VALUE ALERT  Critical Value:  Lactic Acid 11.5,11.1,9                         Troponin 7.21,42.94                         CBG 42  Date & Time Notied:  08/15/2016  Provider Notified: Sallye Ober, NP. MD [place]  Orders Received/Actions taken: Started D5 .45 NS @ 75 mL/Hr, continue to monitor

## 2016-08-13 NOTE — Progress Notes (Signed)
Troponin >65 was reported to Cardiology MD at the bedside. Report given. Pt transported to [facility] (with belongings) Stable at this time.

## 2016-08-13 NOTE — Progress Notes (Signed)
[facility] Progress Note Patient Name: Casey Chapman DOB: 1985-05-05 MRN: [id number]   Date of Service  08/09/2016  HPI/Events of Note  Multiple issues: 1. K+ = 3.3 and Creatinine = 2.59 and 2. ABG on 100%/PRVC 35/TV 470/P 8 = 7.36/36.8/107/20.5.  eICU Interventions  Will order: 1. Cautiously replace K+. 2. Continue present ventilator management.     Intervention Category Major Interventions: Respiratory failure - evaluation and management;Electrolyte abnormality - evaluation and management  [name] 09/01/2016, 7:57 PM

## 2016-08-13 NOTE — Progress Notes (Signed)
Pt arrived on the unit , CPR in progress. Code Blue was called and team at the bedside already. Continued CPR. ACLS medication given. Aline, CVP and Swan was placed, Xray done, Labs sent. MD team communicated need to transfer to [facility].   ETT on 100%, Levo, Epi, Milrinone, Bicarb, Fentanyl drips. MD talking with the family at this time.

## 2016-08-13 NOTE — Progress Notes (Addendum)
Family updated multiple times as am has progressed (am rounding updated boyfriend, when family arrived updated at bedside and 2x after 2nd CPR event).   Most recent labs include VBG / Co-Ox.  VBG 7.303 / CO2 42.  SvO2 50.%.  Reviewed with CHF service.  Milrinone initiated.  Levophed as needed to maintain MAP > 65.  Bicarbonate gtt continues.  Will continue to monitor.  Working toward transfer to [facility] for further cardiac evaluation.     Noe Gens, NP-C [name] Pgr: [phone] or if no answer [phone] 08/05/2016, 1:47 PM

## 2016-08-14 ENCOUNTER — Inpatient Hospital Stay (HOSPITAL_COMMUNITY): Payer: 59

## 2016-08-14 ENCOUNTER — Encounter (HOSPITAL_COMMUNITY): Payer: Self-pay | Admitting: *Deleted

## 2016-08-14 DIAGNOSIS — R569 Unspecified convulsions: Secondary | ICD-10-CM

## 2016-08-14 DIAGNOSIS — I43 Cardiomyopathy in diseases classified elsewhere: Secondary | ICD-10-CM

## 2016-08-14 DIAGNOSIS — N179 Acute kidney failure, unspecified: Secondary | ICD-10-CM

## 2016-08-14 DIAGNOSIS — I110 Hypertensive heart disease with heart failure: Principal | ICD-10-CM

## 2016-08-14 LAB — POCT I-STAT 3, ART BLOOD GAS (G3+)
Acid-Base Excess: 3 mmol/L — ABNORMAL HIGH (ref 0.0–2.0)
BICARBONATE: 26.5 mmol/L (ref 20.0–28.0)
BICARBONATE: 24.1 mmol/L (ref 20.0–28.0)
O2 SAT: 100 %
O2 Saturation: 100 %
PCO2 ART: 34.3 mmHg (ref 32.0–48.0)
PH ART: 7.422 (ref 7.350–7.450)
PO2 ART: 419 mmHg — AB (ref 83.0–108.0)
Patient temperature: 36.1
Patient temperature: 98.6
TCO2: 28 mmol/L (ref 0–100)
TCO2: 25 mmol/L (ref 0–100)
pCO2 arterial: 37 mmHg (ref 32.0–48.0)
pH, Arterial: 7.493 — ABNORMAL HIGH (ref 7.350–7.450)
pO2, Arterial: 319 mmHg — ABNORMAL HIGH (ref 83.0–108.0)

## 2016-08-14 LAB — COMPREHENSIVE METABOLIC PANEL
ALT: 643 U/L — ABNORMAL HIGH (ref 14–54)
ANION GAP: 19 — AB (ref 5–15)
AST: 1901 U/L — AB (ref 15–41)
Albumin: 2 g/dL — ABNORMAL LOW (ref 3.5–5.0)
Alkaline Phosphatase: 63 U/L (ref 38–126)
BILIRUBIN TOTAL: 1 mg/dL (ref 0.3–1.2)
BUN: 42 mg/dL — AB (ref 6–20)
CO2: 20 mmol/L — ABNORMAL LOW (ref 22–32)
Calcium: 6.8 mg/dL — ABNORMAL LOW (ref 8.9–10.3)
Chloride: 97 mmol/L — ABNORMAL LOW (ref 101–111)
Creatinine, Ser: 2.95 mg/dL — ABNORMAL HIGH (ref 0.44–1.00)
GFR calc Af Amer: 23 mL/min — ABNORMAL LOW (ref >60)
GFR, EST NON AFRICAN AMERICAN: 20 mL/min — AB (ref >60)
Glucose, Bld: 118 mg/dL — ABNORMAL HIGH (ref 65–99)
POTASSIUM: 4.7 mmol/L (ref 3.5–5.1)
Sodium: 136 mmol/L (ref 135–145)
TOTAL PROTEIN: 4.5 g/dL — AB (ref 6.5–8.1)

## 2016-08-14 LAB — BASIC METABOLIC PANEL
ANION GAP: 17 — AB (ref 5–15)
Anion gap: 19 — ABNORMAL HIGH (ref 5–15)
BUN: 46 mg/dL — ABNORMAL HIGH (ref 6–20)
BUN: 48 mg/dL — ABNORMAL HIGH (ref 6–20)
CALCIUM: 6.2 mg/dL — AB (ref 8.9–10.3)
CALCIUM: 5.7 mg/dL — AB (ref 8.9–10.3)
CO2: 20 mmol/L — AB (ref 22–32)
CO2: 20 mmol/L — ABNORMAL LOW (ref 22–32)
CREATININE: 3.21 mg/dL — AB (ref 0.44–1.00)
Chloride: 96 mmol/L — ABNORMAL LOW (ref 101–111)
Chloride: 98 mmol/L — ABNORMAL LOW (ref 101–111)
Creatinine, Ser: 3.19 mg/dL — ABNORMAL HIGH (ref 0.44–1.00)
GFR calc non Af Amer: 18 mL/min — ABNORMAL LOW (ref >60)
GFR, EST AFRICAN AMERICAN: 21 mL/min — AB (ref >60)
GFR, EST AFRICAN AMERICAN: 21 mL/min — AB (ref >60)
GFR, EST NON AFRICAN AMERICAN: 18 mL/min — AB (ref >60)
GLUCOSE: 118 mg/dL — AB (ref 65–99)
GLUCOSE: 128 mg/dL — AB (ref 65–99)
POTASSIUM: 4.6 mmol/L (ref 3.5–5.1)
Potassium: 4.4 mmol/L (ref 3.5–5.1)
SODIUM: 135 mmol/L (ref 135–145)
Sodium: 135 mmol/L (ref 135–145)

## 2016-08-14 LAB — CBC
HEMATOCRIT: 26.9 % — AB (ref 36.0–46.0)
Hemoglobin: 9.1 g/dL — ABNORMAL LOW (ref 12.0–15.0)
MCH: 29.7 pg (ref 26.0–34.0)
MCHC: 33.8 g/dL (ref 30.0–36.0)
MCV: 87.9 fL (ref 78.0–100.0)
Platelets: 48 10*3/uL — ABNORMAL LOW (ref 150–400)
RBC: 3.06 MIL/uL — ABNORMAL LOW (ref 3.87–5.11)
RDW: 18.7 % — AB (ref 11.5–15.5)
WBC: 24 10*3/uL — AB (ref 4.0–10.5)

## 2016-08-14 LAB — COOXEMETRY PANEL
CARBOXYHEMOGLOBIN: 1 % (ref 0.5–1.5)
Methemoglobin: 1.7 % — ABNORMAL HIGH (ref 0.0–1.5)
O2 SAT: 64 %
TOTAL HEMOGLOBIN: 9 g/dL — AB (ref 12.0–16.0)

## 2016-08-14 LAB — BLOOD GAS, ARTERIAL
ACID-BASE DEFICIT: 4.1 mmol/L — AB (ref 0.0–2.0)
BICARBONATE: 19.9 mmol/L — AB (ref 20.0–28.0)
Drawn by: 448981
FIO2: 50
O2 Saturation: 99.4 %
PEEP/CPAP: 8 cmH2O
PH ART: 7.408 (ref 7.350–7.450)
Patient temperature: 97.7
RATE: 24 resp/min
VT: 470 mL
pCO2 arterial: 32 mmHg (ref 32.0–48.0)
pO2, Arterial: 181 mmHg — ABNORMAL HIGH (ref 83.0–108.0)

## 2016-08-14 LAB — ABO/RH: ABO/RH(D): AB POS

## 2016-08-14 LAB — POCT I-STAT, CHEM 8
BUN: 48 mg/dL — AB (ref 6–20)
CREATININE: 3.3 mg/dL — AB (ref 0.44–1.00)
Calcium, Ion: 0.73 mmol/L — CL (ref 1.15–1.40)
Chloride: 98 mmol/L — ABNORMAL LOW (ref 101–111)
Glucose, Bld: 103 mg/dL — ABNORMAL HIGH (ref 65–99)
HEMATOCRIT: 27 % — AB (ref 36.0–46.0)
HEMOGLOBIN: 9.2 g/dL — AB (ref 12.0–15.0)
POTASSIUM: 4.6 mmol/L (ref 3.5–5.1)
Sodium: 135 mmol/L (ref 135–145)
TCO2: 24 mmol/L (ref 0–100)

## 2016-08-14 LAB — TROPONIN I: Troponin I: >65 ng/mL (ref <0.03)

## 2016-08-14 LAB — C4 COMPLEMENT: COMPLEMENT C4, BODY FLUID: 18 mg/dL (ref 14–44)

## 2016-08-14 LAB — ANTINUCLEAR ANTIBODIES, IFA: ANA Ab, IFA: POSITIVE — AB

## 2016-08-14 LAB — GLUCOSE, CAPILLARY
Glucose-Capillary: 106 mg/dL — ABNORMAL HIGH (ref 65–99)
Glucose-Capillary: 122 mg/dL — ABNORMAL HIGH (ref 65–99)
Glucose-Capillary: 124 mg/dL — ABNORMAL HIGH (ref 65–99)
Glucose-Capillary: 137 mg/dL — ABNORMAL HIGH (ref 65–99)
Glucose-Capillary: 218 mg/dL — ABNORMAL HIGH (ref 65–99)
Glucose-Capillary: <10 mg/dL — CL (ref 65–99)

## 2016-08-14 LAB — MPO/PR-3 (ANCA) ANTIBODIES
ANCA Proteinase 3: <3.5 U/mL (ref 0.0–3.5)
Myeloperoxidase Abs: <9 U/mL (ref 0.0–9.0)

## 2016-08-14 LAB — ANGIOTENSIN CONVERTING ENZYME: Angiotensin-Converting Enzyme: 33 U/L (ref 14–82)

## 2016-08-14 LAB — VANCOMYCIN, RANDOM: Vancomycin Rm: 19

## 2016-08-14 LAB — ANTI-DNA ANTIBODY, DOUBLE-STRANDED: DS DNA AB: 1 [IU]/mL (ref 0–9)

## 2016-08-14 LAB — SJOGRENS SYNDROME-B EXTRACTABLE NUCLEAR ANTIBODY: SSB (La) (ENA) Antibody, IgG: 5.1 AI — ABNORMAL HIGH (ref 0.0–0.9)

## 2016-08-14 LAB — SJOGRENS SYNDROME-A EXTRACTABLE NUCLEAR ANTIBODY: SSA (Ro) (ENA) Antibody, IgG: >8 AI — ABNORMAL HIGH (ref 0.0–0.9)

## 2016-08-14 LAB — FOLATE RBC
Folate, Hemolysate: 283.4 ng/mL
Folate, RBC: 1054 ng/mL (ref >498)
Hematocrit: 26.9 % — ABNORMAL LOW (ref 34.0–46.6)

## 2016-08-14 LAB — PATHOLOGIST SMEAR REVIEW

## 2016-08-14 LAB — C3 COMPLEMENT: C3 COMPLEMENT: 110 mg/dL (ref 82–167)

## 2016-08-14 LAB — RHEUMATOID FACTOR: Rhuematoid fact SerPl-aCnc: 10.1 IU/mL (ref 0.0–13.9)

## 2016-08-14 LAB — PROCALCITONIN

## 2016-08-14 LAB — FANA STAINING PATTERNS: Nucleolar Pattern: 1:1280 {titer}

## 2016-08-14 LAB — ANTI-SCLERODERMA ANTIBODY

## 2016-08-14 LAB — MAGNESIUM: MAGNESIUM: 2.1 mg/dL (ref 1.7–2.4)

## 2016-08-14 MED ORDER — VANCOMYCIN HCL IN DEXTROSE 750-5 MG/150ML-% IV SOLN
750.0000 mg | Freq: Once | INTRAVENOUS | Status: AC
  Administered 2016-08-14: 750 mg via INTRAVENOUS
  Filled 2016-08-14: qty 150

## 2016-08-14 MED ORDER — SODIUM CHLORIDE 0.9 % IV SOLN
1.0000 g | Freq: Once | INTRAVENOUS | Status: AC
  Administered 2016-08-14: 1 g via INTRAVENOUS
  Filled 2016-08-14: qty 10

## 2016-08-14 MED ORDER — ORAL CARE MOUTH RINSE
15.0000 mL | OROMUCOSAL | Status: DC
  Administered 2016-08-14 – 2016-08-19 (×48): 15 mL via OROMUCOSAL

## 2016-08-14 MED ORDER — CHLORHEXIDINE GLUCONATE CLOTH 2 % EX PADS
6.0000 | MEDICATED_PAD | Freq: Every day | CUTANEOUS | Status: DC
  Administered 2016-08-14 – 2016-08-15 (×2): 6 via TOPICAL

## 2016-08-14 MED ORDER — SODIUM CHLORIDE 0.9 % IV SOLN
2.0000 g | Freq: Once | INTRAVENOUS | Status: AC
  Administered 2016-08-14: 2 g via INTRAVENOUS
  Filled 2016-08-14: qty 20

## 2016-08-14 MED ORDER — LORAZEPAM 2 MG/ML IJ SOLN
2.0000 mg | Freq: Once | INTRAMUSCULAR | Status: AC
  Administered 2016-08-14: 2 mg via INTRAVENOUS

## 2016-08-14 MED ORDER — POTASSIUM CHLORIDE 10 MEQ/50ML IV SOLN
INTRAVENOUS | Status: AC
  Administered 2016-08-14: 10 meq via INTRAVENOUS
  Filled 2016-08-14: qty 200

## 2016-08-14 MED ORDER — SODIUM CHLORIDE 0.9 % IV SOLN
INTRAVENOUS | Status: DC
  Administered 2016-08-14: via INTRAVENOUS

## 2016-08-14 MED ORDER — SODIUM BICARBONATE 8.4 % IV SOLN
INTRAVENOUS | Status: DC
  Administered 2016-08-14 (×2): via INTRAVENOUS
  Filled 2016-08-14 (×2): qty 850

## 2016-08-14 MED ORDER — CALCIUM GLUCONATE 10 % IV SOLN
1.0000 g | Freq: Once | INTRAVENOUS | Status: DC
  Filled 2016-08-14: qty 10

## 2016-08-14 MED ORDER — SODIUM CHLORIDE 0.9 % IV BOLUS (SEPSIS)
1000.0000 mL | Freq: Once | INTRAVENOUS | Status: AC
  Administered 2016-08-14: 1000 mL via INTRAVENOUS

## 2016-08-14 MED ORDER — SODIUM CHLORIDE 0.9 % IV BOLUS (SEPSIS): 500.0000 mL | Freq: Once | INTRAVENOUS | Status: DC

## 2016-08-14 MED ORDER — INSULIN ASPART 100 UNIT/ML ~~LOC~~ SOLN
0.0000 [IU] | SUBCUTANEOUS | Status: DC
  Administered 2016-08-14: 3 [IU] via SUBCUTANEOUS
  Administered 2016-08-14: 7 [IU] via SUBCUTANEOUS
  Administered 2016-08-14 – 2016-08-15 (×3): 3 [IU] via SUBCUTANEOUS
  Administered 2016-08-17: 2 [IU] via SUBCUTANEOUS
  Administered 2016-08-17: 3 [IU] via SUBCUTANEOUS
  Administered 2016-08-18 – 2016-08-19 (×4): 2 [IU] via SUBCUTANEOUS
  Administered 2016-08-19: 3 [IU] via SUBCUTANEOUS

## 2016-08-14 MED ORDER — PANTOPRAZOLE SODIUM 40 MG PO PACK: 40.0000 mg | PACK | Freq: Every day | ORAL | Status: DC

## 2016-08-14 MED ORDER — SODIUM CHLORIDE 0.9 % IV SOLN
1000.0000 mg | Freq: Once | INTRAVENOUS | Status: AC
Start: 2016-08-14 — End: 2016-08-14
  Administered 2016-08-14: 1000 mg via INTRAVENOUS
  Filled 2016-08-14: qty 10

## 2016-08-14 MED ORDER — MUPIROCIN 2 % EX OINT
1.0000 "application " | TOPICAL_OINTMENT | Freq: Two times a day (BID) | CUTANEOUS | Status: AC
  Administered 2016-08-14 – 2016-08-18 (×10): 1 via NASAL
  Filled 2016-08-14: qty 22

## 2016-08-14 MED ORDER — SODIUM CHLORIDE 0.9 % IV BOLUS (SEPSIS)
500.0000 mL | Freq: Once | INTRAVENOUS | Status: AC
  Administered 2016-08-14: 1000 mL via INTRAVENOUS

## 2016-08-14 MED ORDER — PANTOPRAZOLE SODIUM 40 MG IV SOLR
40.0000 mg | INTRAVENOUS | Status: DC
  Administered 2016-08-14 – 2016-08-18 (×5): 40 mg via INTRAVENOUS
  Filled 2016-08-14 (×5): qty 40

## 2016-08-14 MED ORDER — ALBUMIN HUMAN 25 % IV SOLN
50.0000 g | Freq: Once | INTRAVENOUS | Status: AC
  Administered 2016-08-14: 50 g via INTRAVENOUS
  Filled 2016-08-14: qty 50

## 2016-08-14 MED ORDER — SODIUM CHLORIDE 0.9 % IV BOLUS (SEPSIS)
500.0000 mL | Freq: Once | INTRAVENOUS | Status: AC
  Administered 2016-08-14: 500 mL via INTRAVENOUS

## 2016-08-14 MED ORDER — LORAZEPAM 2 MG/ML IJ SOLN
INTRAMUSCULAR | Status: AC
  Filled 2016-08-14: qty 1

## 2016-08-14 MED ORDER — LORAZEPAM 2 MG/ML IJ SOLN
2.0000 mg | INTRAMUSCULAR | Status: DC | PRN
Start: 2016-08-14 — End: 2016-08-19
  Administered 2016-08-15 – 2016-08-19 (×4): 2 mg via INTRAVENOUS
  Filled 2016-08-14 (×4): qty 1

## 2016-08-14 MED ORDER — MILRINONE LACTATE IN DEXTROSE 20-5 MG/100ML-% IV SOLN
0.5000 ug/kg/min | INTRAVENOUS | Status: DC
  Administered 2016-08-14 – 2016-08-19 (×12): 0.5 ug/kg/min via INTRAVENOUS
  Filled 2016-08-14 (×12): qty 100

## 2016-08-14 MED ORDER — SODIUM CHLORIDE 0.9 % IV SOLN
INTRAVENOUS | Status: DC
  Administered 2016-08-15: 11:00:00 via INTRAVENOUS

## 2016-08-14 MED ORDER — POTASSIUM CHLORIDE 10 MEQ/50ML IV SOLN
10.0000 meq | INTRAVENOUS | Status: AC
  Administered 2016-08-14 (×4): 10 meq via INTRAVENOUS

## 2016-08-14 NOTE — Progress Notes (Signed)
Updated Dr. Haroldine Laws updated on current vitals and lab work. Made aware that she has had 0 urine output since 7pm.  Orders received. Will continue to closely monitor. Richarda Blade RN

## 2016-08-14 NOTE — Progress Notes (Signed)
PULMONARY / CRITICAL CARE MEDICINE   Name: Casey Chapman MRN: [id number] DOB: [birth year]    ADMISSION DATE:  08/23/2016 CONSULTATION DATE:  08/17/2016  REFERRING MD:  Dr. Randal Buba   CHIEF COMPLAINT:  HTN   BRIEF SUMMARY:   31 year old female with PMH of autoimmune hemolytic anemia s/p splenectomy, being followed by Dr. Burr Medico in Hematology. Has reported 60 lb weight loss in past 2 years with history of weakness and swelling in lower extremity.  Presented with pain in hands, feet, marked hypertension to the [facility] ER on 7/9.  Family noted development of a rash around chest/neck over the last year and progressive hypertension.  In the [facility] ER she was very hypertensive which was treated with labetalol, not long afterwards she had a cardiac arrest.  She was moved to [facility] later in the day with severe cardiogenic shock, unexplained airspace disease and recurrent episodes of cardiac arrest.  On 7/9 she had at least 4 episodes of cardiac arrest requiring CPR for approximately 30-45 minutes in total.  Echo on 7/9 showed a moderate pericardial effusion, LVEF 20-25%, RV function severely reduced.   SUBJECTIVE:   Moved to [facility], 2H overnight, see summary above oliguric  VITAL SIGNS: BP 117/78   Pulse (!) 151   Temp (!) 100.4 F (38 C)   Resp (!) 25   Ht '5\' 6"'$  (1.676 m)   Wt 68 kg (149 lb 14.6 oz)   LMP 08/09/2016 (Exact Date) Comment: negative beta HCG 08/31/2016  SpO2 97%   BMI 24.20 kg/m   HEMODYNAMICS: PAP: (20-36)/(11-23) 21/13 CVP:  [1 mmHg-13 mmHg] 1 mmHg PCWP:  [4 mmHg-12 mmHg] 4 mmHg CO:  [2.6 L/min-3.4 L/min] 3 L/min CI:  [1.5 L/min/m2-2 L/min/m2] 1.8 L/min/m2  VENTILATOR SETTINGS: Vent Mode: PRVC FiO2 (%):  [40 %-100 %] 100 % Set Rate:  [20 bmp-35 bmp] 35 bmp Vt Set:  [470 mL] 470 mL PEEP:  [5 cmH20-8 cmH20] 8 cmH20 Plateau Pressure:  [19 cmH20-28 cmH20] 24 cmH20  INTAKE / OUTPUT: I/O last 3 completed shifts: In: 5801.4 [I.V.:5021.4; Blood:335; NG/GT:45; IV Piggyback:400] Out: 295  [Urine:95; Emesis/NG output:200]  PHYSICAL EXAMINATION:  General:  In bed on vent HENT: NCAT ETT in place PULM: Crackles bases B, vent supported breathing CV: RRR, no mgr GI: BS+, soft, nontender MSK: normal bulk and tone Neuro: sedated on vent     LABS:  BMET  Recent Labs Lab 08/23/2016 2006 09/02/2016 2256 08/14/16 0358  NA 136 135 136  K 3.0* 3.4* 4.7  CL 95* 96* 97*  CO2 20* 20* 20*  BUN 38* 40* 42*  CREATININE 2.61* 2.77* 2.95*  GLUCOSE 183* 205* 118*    Electrolytes  Recent Labs Lab 08/12/2016 0631 08/10/2016 0952 08/12/2016 1653  08/12/2016 2006 08/27/2016 2256 08/14/16 0358  CALCIUM 8.2* 8.0* 8.2*  < > 7.3* 7.0* 6.8*  MG 2.6* 2.4 2.3  --  1.9  --   --   PHOS 10.6* 9.8* 11.2*  --   --   --   --   < > = values in this interval not displayed.  CBC  Recent Labs Lab 08/16/2016 0952 08/25/2016 1653 08/14/16 0358  WBC 16.5* 21.8* 24.0*  HGB 8.6* 6.3* 9.1*  HCT 26.8* 20.0* 26.9*  PLT 40* 34* 48*    Coag's  Recent Labs Lab 08/18/2016 1653  INR 2.90    Sepsis Markers  Recent Labs Lab 08/07/2016 0631  08/24/2016 1000 08/24/2016 1653 08/21/2016 2011  LATICACIDVEN 11.5*  < > 9.0* 17.1*  12.5*  PROCALCITON 3.08  --   --   --   --   < > = values in this interval not displayed.  ABG  Recent Labs Lab 08/09/2016 1633 08/06/2016 1932 08/25/2016 2312  PHART 7.064* 7.361 7.493*  PCO2ART 76.6* 36.8 34.3  PO2ART 95.0 207* 319.0*    Liver Enzymes  Recent Labs Lab 08/24/2016 1653 08/14/16 0358  AST 732* 1,901*  ALT 265* 643*  ALKPHOS 58 63  BILITOT 0.6 1.0  ALBUMIN 1.8* 2.0*    Cardiac Enzymes  Recent Labs Lab 08/27/2016 1653 08/17/2016 1820 08/14/16 0358  TROPONINI >65.00* >65.00* >65.00*    Glucose  Recent Labs Lab 08/28/2016 1156 08/10/2016 1225 08/22/2016 2056 08/27/2016 2359 08/14/16 0419 08/14/16 0746  GLUCAP <10* 83 164* 218* 137* 106*    Imaging Dg Chest Port 1 View  Result Date: 08/14/2016 CLINICAL DATA:  Hypoxia EXAM: PORTABLE CHEST 1 VIEW  COMPARISON:  August 13, 2016 FINDINGS: Endotracheal tube tip is 4.4 cm above the carina. Nasogastric tube tip and side port are below the diaphragm. Swan-Ganz catheter tip is in the proximal right intralobar pulmonary artery. There is a right jugular catheter with tip in superior vena cava. No pneumothorax. There has been significant resolution of pulmonary edema compared to 1 day prior. There remains diffuse interstitial prominence and patchy consolidation in the right lower lung zone. Left lung is now clear. Heart is slightly enlarged with pulmonary vascularity within normal limits. No adenopathy. No evident bone lesions. IMPRESSION: Tube and catheter positions as described without evident pneumothorax. Significant clearing of pulmonary edema bilaterally. Edema with patchy airspace consolidation remains in the right lower lobe. There may be a degree of pneumonia in the right lower lobe. Left lung now clear. Heart slightly enlarged, less prominent than 1 day prior. Electronically Signed   By: Lowella Grip III M.D.   On: 08/14/2016 08:12   Dg Chest Port 1 View  Result Date: 09/03/2016 CLINICAL DATA:  31 year old female status post intubation. EXAM: PORTABLE CHEST 1 VIEW COMPARISON:  Chest x-ray 08/18/2016. FINDINGS: An endotracheal tube is in place with tip 4.6 cm above the carina. There is a right-sided internal jugular central venous catheter with tip terminating in the superior cavoatrial junction. New left internal jugular central venous Cordis through which a Swan-Ganz catheter has been passed into a right descending pulmonary artery branch. Transcutaneous defibrillator pads project over the lower left hemithorax. No pneumothorax. There is cephalization of the pulmonary vasculature, indistinctness of the interstitial markings, and patchy airspace disease throughout the lungs bilaterally suggestive of moderate pulmonary edema. Trace bilateral pleural effusions. Moderate enlargement of the cardiopericardial  silhouette could suggest cardiomegaly and/or enlarging pericardial effusion. Upper mediastinal contours are within normal limits. IMPRESSION: 1. Support apparatus, as above. 2. The appearance the chest suggests worsening congestive heart failure, as detailed above. Electronically Signed   By: Vinnie Langton M.D.   On: 08/18/2016 17:21     STUDIES:  CTA Chest 05/02/16 >> Cardiomegaly with trace pericardial effusion for age. There is mild diffuse anasarca. Nonspecific retroclavicular, subpectoral, and bilateral axillary lymphadenopathy. Findings have been chronic unstable back to 2016 CT. No acute pulmonary embolus. Small hiatal hernia with fluid in the distal esophagus likely related to reflux.Scattered pulmonary blebs and bulla. No pneumonic consolidation, dominant mass, effusion or pneumothorax. CXR 7/9 >> Endotracheal tube tip approximately 2.3 cm superior to the carina. Moderate to marked cardiomegaly with globular cardiac configuration, consider pericardial effusion Development of hazy bilateral pulmonary opacity suspicious for vascular congestion and  pulmonary edema CT Head 7/9 >> No acute process CTA Chest / ABD 7/9 >> stable cardiomegaly, large pericardial effusion (increased since 04/2016), no evidenceof aortic dissection or aneurysm, no PE.  Vascular congestion and pulmonary edema, scattered cystic changes of the lung, heterogeneous enhancement of the kidneys concerning for pyelonephritis, diffusely dilated small bowel concerning for ileus, diffuse subcutaneous edema / anasarca UDS 7/9 >> positive THC Echo 7/9 > LVEF 20-25%, mild LVH, mild LAE, moderate pericardial effusion  CULTURES: UA 7/9 >>  resp culture 7/9 >>  ANTIBIOTICS: Rocephin 7/9 >> 7/9 Cefepime 7/9 >> Vanc 7/9 >>  SIGNIFICANT EVENTS: 7/08  Presents to ED with HTN > arrested in ER 7/9 Cardiac arrest x4, moved to [facility] severe cardiomyopathy/acidosis  LINES/TUBES: ETT 7/9 >>  R IJ TLC 7/9 >>   AUTOIMMUNE 7/9  C3 >>  normal C4 >> normal GBM >>  SSA >>  SSB >>  DSDNA >>  Anti-scleroderma >>  ANCA >>  ACE >> 33 RA >> normal CRP >>  ESR >>  Phosphatidylserine antibodies >>  DISCUSSION: 31 y/o female with underlying autoimmune hemolytic anemia presented on 7/9 with extremity pain and severe hypertension; when treated with labetalol developed cardiac arrest.  Noted to have a severe cardiomyopathy on exam, active urinary sediment.  Family notes severe pulmonary hypertension for weeks and an evolving rash over neck/chest.  There is concern for an underlying connective tissue disease.  Unifying diagnosis at this point is not certain.   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 Radiology says could also be consistent with medical renal disease like glomerulonephritis Concern for glomerulonephritis > active urinary sediment on U/A P:   Renal dose meds Replace electrolytes as needed IV today Stop bicarbonate infusion Repeat BMET/ABG 1600 today May need CVVHD, monitor UOP and labs closely  GASTROINTESTINAL A:   Weight loss in last year Ileus on CT P:   Continue tube feeding Continue PPI > via tube  HEMATOLOGIC A:   Hemolytic Anemia  s/p Splenectomy  -CT A/P in 2015 showed adenopathy E inguinal, external iliac -CT CAP in 2016 showed axilla, supraclavicular and subpectoral adenopathy > progressive since 2011 -Left Axially Lymph node biopsy in 2016 > 2 Benign lymph nodes, no malignancy  P:  Monitor for bleeding CBC daily Transfusion for Hgb <7gm/dL  INFECTIOUS A:   Pyelonephritis? Doubt Aspiration pneumonia? doubt P:   F/u cultures Will empirically cover with broad antibiotics for now  ENDOCRINE A:   Hyperglycemia  P:   SSI Stop dextrose containing infusions  NEUROLOGIC A:   Acute encephalopathy post arrest> high likelihood of anoxic injury THC use P:   RASS goal -2 Stop propofol, stop versed gtt Continue fentanyl gtt if agitation, holding for now    FAMILY  - Updates: Boyfriend updated at bedside 7/10; I also updated  family at length on 7/9  - Inter-disciplinary family meet or Palliative Care meeting due by:  08/20/2016   CC time 50 minutes  Roselie Awkward, MD [place] [facility] Pager: [phone] Cell: [phone] After 3pm or if no response, call [phone]

## 2016-08-14 NOTE — Progress Notes (Signed)
Pharmacy Antibiotic Note Casey Chapman is a 31 y.o. female admitted on 08/12/2016 s/p cardiac arrest several lines placed during code. WBC up to 24; Tm down to 101.7. Cr continues to climb, up to 3.19 today (baseline ~1), but suspect rising post arrest.  Random vancomycin level was therapeutic. Extrapolated to 24 hour level of 17.5. With drastically elevated creatinine, will give single dose tonight and reassess clinical status and creatinine tomorrow.   Plan: Give vancomycin 750mg  x1 tonight Continue cefepime 1gIVq24h Monitor creatinine and redose as appropriate  Height: 5\' 6"  (167.6 cm) Weight: 149 lb 14.6 oz (68 kg) IBW/kg (Calculated) : 59.3  Temp (24hrs), Avg:99.2 F (37.3 C), Min:96.4 F (35.8 C), Max:101.7 F (38.7 C)   Recent Labs Lab 09/04/2016 0028 08/12/2016 0631 09/01/2016 0810 09/03/2016 0952 08/15/2016 1000 08/26/2016 1653  08/25/2016 2006 08/18/2016 2011 08/10/2016 2256 08/14/16 0358 08/14/16 1134 08/14/16 1533 08/14/16 1757  WBC 13.8* 13.6*  --  16.5*  --  21.8*  --   --   --   --  24.0*  --   --   --   CREATININE 0.88 1.46*  --  1.77*  --  2.23*  < > 2.61*  --  2.77* 2.95* 3.21* 3.19*  --   LATICACIDVEN  --  11.5* 11.1*  --  9.0* 17.1*  --   --  12.5*  --   --   --   --   --   VANCORANDOM  --   --   --   --   --   --   --   --   --   --   --   --   --  19  < > = values in this interval not displayed.  Estimated Creatinine Clearance: 23.5 mL/min (A) (by C-G formula based on SCr of 3.19 mg/dL (H)).    No Known Allergies  Antimicrobials this admission: Vancomycin 7/9 >>  Cefepime  7/9 >>   Dose adjustments this admission: 7/10: VR 19. Ke 0.0324 (t1/2 21 hours). Est trough (24 hours from last dose) = 17.5. 750 q24h provides est peak of 32 and trough of 15.  Microbiology results: 7/9 MRSA PCR: Negative  Thank you for allowing pharmacy to be a part of this patient's care.  Dierdre Harness, PharmD Clinical Pharmacist [phone] (Pager) 08/14/2016 8:00 PM

## 2016-08-14 NOTE — Progress Notes (Signed)
Notified by RN of fever to 102, increased sr cr, CVP 4, HR 160's, SBP decreased to 90's   P: 500 ml NS bolus x1 Cooling blanket  Trend CVP   Noe Gens, NP-C [place] Pgr: [phone] or if no answer [phone] 08/14/2016, 1:17 PM

## 2016-08-14 NOTE — Progress Notes (Signed)
eLink Physician-Brief Progress Note Patient Name: Casey Chapman DOB: 01-11-1986 MRN: [id number]   Date of Service  08/14/2016  HPI/Events of Note  CBgs high On high dose steroids, bicarb gtt  eICU Interventions  Change bicarb to sterile water , drop to 75/h SSI-resistant scale     Intervention Category Intermediate Interventions: Hyperglycemia - evaluation and treatment  Jaequan Propes V. 08/14/2016, 12:27 AM

## 2016-08-14 NOTE — Consult Note (Signed)
Neurology Consultation Reason for Consult: Seizures Referring Physician: Ann Lions  CC: Seizures  History is obtained from: medical record, family.   HPI: Casey Chapman is a 31 y.o. female with a history of autoimmune hemolytic anemia who presented to [facility] with severe hypertension. She then had multiple cardiac arrests with prolonged down time. She now has severely reduced ejection fraction, elevated creatinine, elevated liver enzymes. There is concern for autoimmune disease playing a role, possible vasculitis.  Tonight, she had new onset seizures with approximately 4 episodes over the course of 45 minutes. These were all focal with left side jerking. Ativan was given just prior to my examination with no recurrence since that time.  She has not had any paralytics since sedation.  ROS:  Unable to obtain due to altered mental status.   Past Medical History:  Diagnosis Date  . Chlamydia 04/16/12   Treated   . Eczema   . Hemolytic anemia ([place]) 2012   Autoimmune      Family History  Problem Relation Age of Onset  . Diabetes Mother   . Hypertension Mother   . Depression Mother   . Diabetes Brother   . Diabetes Maternal Grandmother   . Diabetes Paternal Grandmother   . Hypertension Paternal Grandmother   . Thyroid disease Father        Thyroid removed     Social History:  reports that she has never smoked. She has never used smokeless tobacco. She reports that she drinks alcohol. She reports that she does not use drugs.   Exam: Current vital signs: BP (!) 98/59   Pulse (!) 137   Temp (!) 97.5 F (36.4 C)   Resp (!) 25   Ht 5\' 6"  (1.676 m)   Wt 68 kg (149 lb 14.6 oz)   LMP 08/09/2016 (Exact Date) Comment: negative beta HCG 08/12/2016  SpO2 98%   BMI 24.20 kg/m  Vital signs in last 24 hours: Temp:  [96.4 F (35.8 C)-101.7 F (38.7 C)] 97.5 F (36.4 C) (07/10 2015) Pulse Rate:  [119-161] 137 (07/10 2015) Resp:  [21-35] 25 (07/10 2015) BP: (84-138)/(59-104) 98/59  (07/10 2015) SpO2:  [78 %-100 %] 98 % (07/10 2024) Arterial Line BP: (82-149)/(53-105) 100/54 (07/10 2015) FiO2 (%):  [50 %-100 %] 50 % (07/10 2024) Weight:  [68 kg (149 lb 14.6 oz)] 68 kg (149 lb 14.6 oz) (07/10 0400)   Physical Exam  Constitutional: Appears well-developed and well-nourished.  Psych: Affect appropriate to situation Eyes: some scleral edema HENT: ET tube in place.  Head: Normocephalic.  Cardiovascular: Normal rate  Respiratory: Ventilated GI: Soft.  No distension. There is no tenderness.  Skin: light colored skin discoloration on the chest.   Neuro: Mental Status: Patient is comatose, does not open eyes or follow commands Cranial Nerves: II: Does not blink to threat Pupils are equal, round, and reactive to light.   III,IV, VI: No movement with doll's eye maneuver V:VII: Corneals are present X: Cough is intact  Motor: She has no motor response to any noxious stimulus. Sensory: As above Deep Tendon Reflexes: She has depressed reflexes throughout, trace biceps reflex is obtained.  Plantars: Toes are downgoing bilaterally.  Cerebellar: She does not perform  I have reviewed labs in epic and the results pertinent to this consultation are: Hypocalcemia  I have reviewed the images obtained: CT head on admission-negative  Impression: 31 yo F with new onset focal seizures and coma following cardiac arrest. I have high concern for possible anoxic injury.  She will need repeat head imaging and I would favor stat EEG with continuous monitoring to rule out ongoing seizures.  Recommendations: 1) stat head CT 2) stat EEG 3) Keppra 500 mg BID(high dose given renal function) 4) Neurology will follow.    Roland Rack, MD [facility] [phone]  If 7pm- 7am, please page neurology on call as listed in [place].

## 2016-08-14 NOTE — Progress Notes (Signed)
Unopened bag of 290ml Fentanyl and unopened bag of 102ml Versed wasted in sink with [employer], RN and [employer], Therapist, sports.

## 2016-08-14 NOTE — Progress Notes (Signed)
Paged MD concerning vitals. Order to remove femoral sheath, apply cooling blanket. RN will continue to monitor.

## 2016-08-14 NOTE — Care Management Note (Signed)
Case Management Note Marvetta Gibbons RN, BSN Unit 2W-Case Manager-- [place] coverage [phone]  Patient Details  Name: Marisella Puccio MRN: [id number] Date of Birth: [birth year]  Subjective/Objective:   Pt admitted s/p multiple PEA arrests on 7/9.  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                Action/Plan: PTA pt lived at home with SO- [place], - CM to follow  Expected Discharge Date:                  Expected Discharge Plan:     In-House Referral:     Discharge planning Services  CM Consult  Post Acute Care Choice:    Choice offered to:     DME Arranged:    DME Agency:     HH Arranged:    [place] Agency:     Status of Service:  In process, will continue to follow  If discussed at Long Length of Stay Meetings, dates discussed:    Discharge Disposition:   Additional Comments:  Dawayne Patricia, RN 08/14/2016, 9:11 AM

## 2016-08-14 NOTE — Progress Notes (Signed)
CRITICAL VALUE ALERT  Critical Value:  Calcium 5.7  Date & Time Notied:  08/14/16 at [place]  Provider Notified: Dr. Sung Amabile  Orders Received/Actions taken: Amp of Calcium Gluconate

## 2016-08-14 NOTE — Progress Notes (Signed)
Pt has order for 24 hour urine pending since 08/14/2016 1022am. . Pt has not urinated on my shift. Will initate when patient urinates.

## 2016-08-14 NOTE — Progress Notes (Signed)
Unable to doppler pedal pulses. Able to doppler popliteal. HR 152. MAP 65 on levo at 15 mcg, epi at 8. Pt unresponsive. MD aware.

## 2016-08-14 NOTE — Progress Notes (Signed)
eLink Physician-Brief Progress Note Patient Name: Casey Chapman DOB: 07/16/1985 MRN: [id number]   Date of Service  08/14/2016  HPI/Events of Note  RN called with multiple concerns: 1) Recurrent seizure-like movement of L side - I witnessed this and it appears to be tru seizure 2) persistent vasopressor dependence and persistently low CVP 3) Hypocalcemia  eICU Interventions  1) Lorazepam 2 mg IVP X 1. Keppra 1000 mg X 1. Neuro Consultation requested 2) Albumin 50 gm ordered 3) Check ionized Ca++     Intervention Category Major Interventions: Other:  Casey Chapman 08/14/2016, 9:38 PM

## 2016-08-14 NOTE — Progress Notes (Signed)
Transported patient to Cat scan while patient was on the ventilator. Patient remained stable during transport.

## 2016-08-14 NOTE — Progress Notes (Signed)
Advanced Heart Failure Team Progress Note   HPI:    Remains intubated on epi 8, norepi 5 and milrinone 0.375.   HR 150s. On high-dose steroids.   Swan numbers  CVP 2 PA 21/15 (17) PCWP 4 Fick 3.0/1.8 SVR 2400  Allergies:  No Known Allergies  Objective:    Vital Signs:   Temp:  [96.8 F (36 C)-104.2 F (40.1 C)] 100.8 F (38.2 C) (07/10 0800) Pulse Rate:  [72-171] 155 (07/10 0800) Resp:  [7-35] 35 (07/10 0800) BP: (35-176)/(20-144) 121/95 (07/10 0800) SpO2:  [69 %-100 %] 97 % (07/10 0800) Arterial Line BP: (49-176)/(20-109) 119/98 (07/10 0800) FiO2 (%):  [40 %-100 %] 100 % (07/10 0800) Weight:  [68 kg (149 lb 14.6 oz)] 68 kg (149 lb 14.6 oz) (07/10 0400) Last BM Date: 08/15/2016  Weight change: Filed Weights   08/12/16 2351 08/26/2016 0604 08/14/16 0400  Weight: 60.8 kg (134 lb) 59.4 kg (130 lb 15.3 oz) 68 kg (149 lb 14.6 oz)    Intake/Output:   Intake/Output Summary (Last 24 hours) at 08/14/16 0833 Last data filed at 08/14/16 0800  Gross per 24 hour  Intake           5996.5 ml  Output              215 ml  Net           5781.5 ml      Physical Exam    General:  Intubated sedated HEENT: normal ETT Neck: supple. RIJ TLC LIC swan   No lymphadenopathy or thyromegaly appreciated. Cor: PMI nondisplaced. Tachy regular. Large hypopigmented rash Lungs: clear anteriorly  Abdomen: soft, nontender, nondistended. No hepatosplenomegaly. No bruits or masses. Good bowel sounds. Extremities: no cyanosis, clubbing, rash, mildd edema Neuro: intubated sedated    Telemetry   Sinus tach 150s. Personally reviewed   EKG      Labs   Basic Metabolic Panel:  Recent Labs Lab 08/30/2016 0631 08/16/2016 0952 08/31/2016 1653 08/28/2016 1820 08/08/2016 2006 08/29/2016 2256 08/14/16 0358  NA 134* 134* 159* 139 136 135 136  K 5.1 5.6* 5.4* 3.3* 3.0* 3.4* 4.7  CL 99* 100* 92* 97* 95* 96* 97*  CO2 15* 13* 40* 19* 20* 20* 20*  GLUCOSE 88 131* 133* 173* 183* 205* 118*  BUN  26* 30* 32* 36* 38* 40* 42*  CREATININE 1.46* 1.77* 2.23* 2.59* 2.61* 2.77* 2.95*  CALCIUM 8.2* 8.0* 8.2* 7.7* 7.3* 7.0* 6.8*  MG 2.6* 2.4 2.3  --  1.9  --   --   PHOS 10.6* 9.8* 11.2*  --   --   --   --     Liver Function Tests:  Recent Labs Lab 08/30/2016 1653 08/14/16 0358  AST 732* 1,901*  ALT 265* 643*  ALKPHOS 58 63  BILITOT 0.6 1.0  PROT 3.9* 4.5*  ALBUMIN 1.8* 2.0*   No results for input(s): LIPASE, AMYLASE in the last 168 hours. No results for input(s): AMMONIA in the last 168 hours.  CBC:  Recent Labs Lab 08/14/2016 0028 08/06/2016 0631 08/06/2016 0952 08/12/2016 1653 08/14/16 0358  WBC 13.8* 13.6* 16.5* 21.8* 24.0*  NEUTROABS  --   --   --  13.5*  --   HGB 9.5* 8.2* 8.6* 6.3* 9.1*  HCT 29.4* 26.4* 26.8* 20.0* 26.9*  MCV 87.8 91.0 90.2 88.9 87.9  PLT 120* 64* 40* 34* 48*    Cardiac Enzymes:  Recent Labs Lab 09/02/2016 0631 08/28/2016 0952 08/07/2016 1653 08/25/2016 1820 08/14/16  0358  TROPONINI 7.21* 42.94* >65.00* >65.00* >65.00*    BNP: BNP (last 3 results)  Recent Labs  05/02/16 1740  BNP 131.6*    ProBNP (last 3 results) No results for input(s): PROBNP in the last 8760 hours.   CBG:  Recent Labs Lab 08/18/2016 1225 08/11/2016 2056 08/06/2016 2359 08/14/16 0419 08/14/16 0746  GLUCAP 83 164* 218* 137* 106*    Coagulation Studies:  Recent Labs  08/16/2016 1653  LABPROT 31.0*  INR 2.90     Imaging   Dg Chest Port 1 View  Result Date: 08/14/2016 CLINICAL DATA:  Hypoxia EXAM: PORTABLE CHEST 1 VIEW COMPARISON:  August 13, 2016 FINDINGS: Endotracheal tube tip is 4.4 cm above the carina. Nasogastric tube tip and side port are below the diaphragm. Swan-Ganz catheter tip is in the proximal right intralobar pulmonary artery. There is a right jugular catheter with tip in superior vena cava. No pneumothorax. There has been significant resolution of pulmonary edema compared to 1 day prior. There remains diffuse interstitial prominence and patchy  consolidation in the right lower lung zone. Left lung is now clear. Heart is slightly enlarged with pulmonary vascularity within normal limits. No adenopathy. No evident bone lesions. IMPRESSION: Tube and catheter positions as described without evident pneumothorax. Significant clearing of pulmonary edema bilaterally. Edema with patchy airspace consolidation remains in the right lower lobe. There may be a degree of pneumonia in the right lower lobe. Left lung now clear. Heart slightly enlarged, less prominent than 1 day prior. Electronically Signed   By: Lowella Grip III M.D.   On: 08/14/2016 08:12   Dg Chest Port 1 View  Result Date: 08/31/2016 CLINICAL DATA:  31 year old female status post intubation. EXAM: PORTABLE CHEST 1 VIEW COMPARISON:  Chest x-ray 08/27/2016. FINDINGS: An endotracheal tube is in place with tip 4.6 cm above the carina. There is a right-sided internal jugular central venous catheter with tip terminating in the superior cavoatrial junction. New left internal jugular central venous Cordis through which a Swan-Ganz catheter has been passed into a right descending pulmonary artery branch. Transcutaneous defibrillator pads project over the lower left hemithorax. No pneumothorax. There is cephalization of the pulmonary vasculature, indistinctness of the interstitial markings, and patchy airspace disease throughout the lungs bilaterally suggestive of moderate pulmonary edema. Trace bilateral pleural effusions. Moderate enlargement of the cardiopericardial silhouette could suggest cardiomegaly and/or enlarging pericardial effusion. Upper mediastinal contours are within normal limits. IMPRESSION: 1. Support apparatus, as above. 2. The appearance the chest suggests worsening congestive heart failure, as detailed above. Electronically Signed   By: Vinnie Langton M.D.   On: 08/12/2016 17:21      Medications:     Current Medications: . chlorhexidine gluconate (MEDLINE KIT)  15 mL Mouth  Rinse BID  . Chlorhexidine Gluconate Cloth  6 each Topical Q0600  . feeding supplement (PRO-STAT SUGAR FREE 64)  30 mL Per Tube Daily  . heparin  5,000 Units Subcutaneous Q8H  . insulin aspart  0-20 Units Subcutaneous Q4H  . mouth rinse  15 mL Mouth Rinse QID  . methylPREDNISolone (SOLU-MEDROL) injection  125 mg Intravenous Q6H  . pantoprazole (PROTONIX) IV  40 mg Intravenous QHS     Infusions: . sodium chloride    . sodium chloride 50 mL/hr at 08/14/16 0800  . ceFEPime (MAXIPIME) IV    . epinephrine 8 mcg/min (08/14/16 0800)  . feeding supplement (VITAL 1.5 CAL) Stopped (09/01/2016 1803)  . fentaNYL infusion INTRAVENOUS Stopped (08/14/16 0400)  . midazolam (VERSED) infusion    .  milrinone 0.375 mcg/kg/min (08/14/16 0800)  . niCARDipine Stopped (08/15/2016 1042)  . nitroGLYCERIN Stopped (08/05/2016 0944)  . norepinephrine (LEVOPHED) Adult infusion 8 mcg/min (08/14/16 0816)  . propofol (DIPRIVAN) infusion Stopped (08/05/2016 1218)  .  sodium bicarbonate (isotonic) infusion in sterile water 75 mL/hr at 08/14/16 0800  . 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   Assessment/Plan   1. Cardiogenic shock  - Trop > 65% - Echo on 7/9 shows LVEF 20-25% with severe RV dysfunction - I performed repeat bedside echo this am. LV thick and underfilled EF ~30-35%. Pericardial effusion somewhat smaller - Swan numbers with low filling pressures. No evidence of tamponade Low cardiac output with high SVR 2. Myopericarditis with moderate to large pericardial effusion - Suspect due to MCTD or viral myocarditis - Serologies pending 3. Cardiac arrest  --7/9 PEA arrest 4. Acute respiratory failure --CXR with marked clearing after steroids. Suggestive of acute pneumonitis picture 5. AKI -now anuric 6. Shock liver 7. Accelerated HTN over past  several weeks 8. Auto-immune hemolytic anemia s/p splenectomy  Patient remains critically ill with MSOF. Suspect fulminant myocarditis either MCTD or viral. 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 Reviewed with Dr. Lake Bells who raised the question of dermatomyositis/MCTD with shawl rash. Serologies pending. Also reviewed with Drs. Cooper and [employer].  Will continue supportive care with inotropes, volume resuscitation and high-dose steroids. Follow hemodynamics closely and can provide mechanical support as needed. May need CVVHD at some point.   Unsure what role recent accelerated HTN plays. ? Renal crisis. Can check renal u/s  Discussed with her mother at bedside  CRITICAL CARE Performed by: [name]  Total critical care time: 90 minutes  Critical care time was exclusive of separately billable procedures and treating other patients.  Critical care was necessary to treat or prevent imminent or life-threatening deterioration.  Critical care was time spent personally by me (independent of midlevel providers or residents) on the following activities: development of treatment plan with patient and/or surrogate as well as nursing, discussions with consultants, evaluation of patient's response to treatment, examination of patient, obtaining history from patient or surrogate, ordering and performing treatments and interventions, ordering and review of laboratory studies, ordering and review of radiographic studies, pulse oximetry and re-evaluation of patient's condition.    Length of Stay: 1  [name], MD  08/14/2016, 8:33 AM  Advanced Heart Failure Team Pager [phone] (M-F; 7a - 4p)  Please contact [facility] for night-coverage after hours (4p -7a ) and weekends on [email or web address]

## 2016-08-14 NOTE — Progress Notes (Signed)
[facility] Progress Note Patient Name: Macrina Lehnert DOB: [birth year] MRN: [id number]   Date of Service  08/14/2016  HPI/Events of Note  Ionized Ca++ = 0.73  eICU Interventions  Will replace Ca++.     Intervention Category Major Interventions: Electrolyte abnormality - evaluation and management  Sommer,Steven Eugene 08/14/2016, 11:28 PM

## 2016-08-15 ENCOUNTER — Inpatient Hospital Stay (HOSPITAL_COMMUNITY): Payer: 59

## 2016-08-15 ENCOUNTER — Other Ambulatory Visit: Payer: Self-pay

## 2016-08-15 DIAGNOSIS — D649 Anemia, unspecified: Secondary | ICD-10-CM

## 2016-08-15 DIAGNOSIS — I429 Cardiomyopathy, unspecified: Secondary | ICD-10-CM

## 2016-08-15 DIAGNOSIS — I10 Essential (primary) hypertension: Secondary | ICD-10-CM

## 2016-08-15 DIAGNOSIS — R579 Shock, unspecified: Secondary | ICD-10-CM

## 2016-08-15 DIAGNOSIS — J9601 Acute respiratory failure with hypoxia: Secondary | ICD-10-CM

## 2016-08-15 DIAGNOSIS — R599 Enlarged lymph nodes, unspecified: Secondary | ICD-10-CM

## 2016-08-15 DIAGNOSIS — K7201 Acute and subacute hepatic failure with coma: Secondary | ICD-10-CM

## 2016-08-15 DIAGNOSIS — D696 Thrombocytopenia, unspecified: Secondary | ICD-10-CM

## 2016-08-15 DIAGNOSIS — R34 Anuria and oliguria: Secondary | ICD-10-CM

## 2016-08-15 DIAGNOSIS — D589 Hereditary hemolytic anemia, unspecified: Secondary | ICD-10-CM

## 2016-08-15 DIAGNOSIS — R570 Cardiogenic shock: Secondary | ICD-10-CM

## 2016-08-15 DIAGNOSIS — I313 Pericardial effusion (noninflammatory): Secondary | ICD-10-CM

## 2016-08-15 LAB — HEPATIC FUNCTION PANEL
ALBUMIN: 2.5 g/dL — AB (ref 3.5–5.0)
ALT: 668 U/L — ABNORMAL HIGH (ref 14–54)
AST: 1155 U/L — ABNORMAL HIGH (ref 15–41)
Alkaline Phosphatase: 62 U/L (ref 38–126)
BILIRUBIN INDIRECT: 1.1 mg/dL — AB (ref 0.3–0.9)
BILIRUBIN TOTAL: 3.3 mg/dL — AB (ref 0.3–1.2)
Bilirubin, Direct: 2.2 mg/dL — ABNORMAL HIGH (ref 0.1–0.5)
TOTAL PROTEIN: 4.9 g/dL — AB (ref 6.5–8.1)

## 2016-08-15 LAB — BASIC METABOLIC PANEL
ANION GAP: 15 (ref 5–15)
ANION GAP: 16 — AB (ref 5–15)
BUN: 54 mg/dL — ABNORMAL HIGH (ref 6–20)
BUN: 57 mg/dL — ABNORMAL HIGH (ref 6–20)
CHLORIDE: 97 mmol/L — AB (ref 101–111)
CO2: 19 mmol/L — AB (ref 22–32)
CO2: 18 mmol/L — AB (ref 22–32)
Calcium: 7.4 mg/dL — ABNORMAL LOW (ref 8.9–10.3)
Calcium: 6.3 mg/dL — CL (ref 8.9–10.3)
Chloride: 101 mmol/L (ref 101–111)
Creatinine, Ser: 3.47 mg/dL — ABNORMAL HIGH (ref 0.44–1.00)
Creatinine, Ser: 3.76 mg/dL — ABNORMAL HIGH (ref 0.44–1.00)
GFR calc non Af Amer: 16 mL/min — ABNORMAL LOW (ref >60)
GFR calc non Af Amer: 15 mL/min — ABNORMAL LOW (ref >60)
GFR, EST AFRICAN AMERICAN: 19 mL/min — AB (ref >60)
GFR, EST AFRICAN AMERICAN: 17 mL/min — AB (ref >60)
Glucose, Bld: 69 mg/dL (ref 65–99)
Glucose, Bld: 91 mg/dL (ref 65–99)
POTASSIUM: 5 mmol/L (ref 3.5–5.1)
POTASSIUM: 5.4 mmol/L — AB (ref 3.5–5.1)
SODIUM: 131 mmol/L — AB (ref 135–145)
Sodium: 135 mmol/L (ref 135–145)

## 2016-08-15 LAB — RETICULOCYTES
RBC.: 3.02 MIL/uL — ABNORMAL LOW (ref 3.87–5.11)
Retic Count, Absolute: 78.5 10*3/uL (ref 19.0–186.0)
Retic Ct Pct: 2.6 % (ref 0.4–3.1)

## 2016-08-15 LAB — VANCOMYCIN, RANDOM: VANCOMYCIN RM: 35

## 2016-08-15 LAB — POCT I-STAT 3, ART BLOOD GAS (G3+)
ACID-BASE DEFICIT: 4 mmol/L — AB (ref 0.0–2.0)
ACID-BASE DEFICIT: 3 mmol/L — AB (ref 0.0–2.0)
BICARBONATE: 18.5 mmol/L — AB (ref 20.0–28.0)
BICARBONATE: 20.1 mmol/L (ref 20.0–28.0)
O2 Saturation: 98 %
O2 Saturation: 99 %
PCO2 ART: 23.6 mmHg — AB (ref 32.0–48.0)
PO2 ART: 90 mmHg (ref 83.0–108.0)
Patient temperature: 97.6
TCO2: 19 mmol/L (ref 0–100)
TCO2: 21 mmol/L (ref 0–100)
pCO2 arterial: 26.5 mmHg — ABNORMAL LOW (ref 32.0–48.0)
pH, Arterial: 7.5 — ABNORMAL HIGH (ref 7.350–7.450)
pH, Arterial: 7.486 — ABNORMAL HIGH (ref 7.350–7.450)
pO2, Arterial: 114 mmHg — ABNORMAL HIGH (ref 83.0–108.0)

## 2016-08-15 LAB — CBC WITH DIFFERENTIAL/PLATELET
BASOS ABS: 0 10*3/uL (ref 0.0–0.1)
Band Neutrophils: 19 %
Basophils Relative: 0 %
Blasts: 0 %
Eosinophils Absolute: 0 10*3/uL (ref 0.0–0.7)
Eosinophils Relative: 0 %
HEMATOCRIT: 20.6 % — AB (ref 36.0–46.0)
Hemoglobin: 7 g/dL — ABNORMAL LOW (ref 12.0–15.0)
Lymphocytes Relative: 16 %
Lymphs Abs: 5.5 10*3/uL — ABNORMAL HIGH (ref 0.7–4.0)
MCH: 28.7 pg (ref 26.0–34.0)
MCHC: 34 g/dL (ref 30.0–36.0)
MCV: 84.4 fL (ref 78.0–100.0)
MONO ABS: 4.1 10*3/uL — AB (ref 0.1–1.0)
MYELOCYTES: 2 %
Metamyelocytes Relative: 1 %
Monocytes Relative: 12 %
NEUTROS PCT: 50 %
NRBC: 0 /100{WBCs}
Neutro Abs: 24.5 10*3/uL — ABNORMAL HIGH (ref 1.7–7.7)
Other: 0 %
PLATELETS: 48 10*3/uL — AB (ref 150–400)
PROMYELOCYTES ABS: 0 %
RBC: 2.44 MIL/uL — AB (ref 3.87–5.11)
RDW: 19.2 % — ABNORMAL HIGH (ref 11.5–15.5)
WBC MORPHOLOGY: INCREASED
WBC: 34.1 10*3/uL — ABNORMAL HIGH (ref 4.0–10.5)

## 2016-08-15 LAB — GLUCOSE, CAPILLARY
GLUCOSE-CAPILLARY: 71 mg/dL (ref 65–99)
GLUCOSE-CAPILLARY: 84 mg/dL (ref 65–99)
GLUCOSE-CAPILLARY: 78 mg/dL (ref 65–99)
GLUCOSE-CAPILLARY: 94 mg/dL (ref 65–99)
GLUCOSE-CAPILLARY: 98 mg/dL (ref 65–99)
Glucose-Capillary: 106 mg/dL — ABNORMAL HIGH (ref 65–99)
Glucose-Capillary: 114 mg/dL — ABNORMAL HIGH (ref 65–99)
Glucose-Capillary: 122 mg/dL — ABNORMAL HIGH (ref 65–99)
Glucose-Capillary: 94 mg/dL (ref 65–99)

## 2016-08-15 LAB — SAVE SMEAR

## 2016-08-15 LAB — DIRECT ANTIGLOBULIN TEST (NOT AT ARMC)
DAT, COMPLEMENT: NEGATIVE
DAT, IGG: NEGATIVE

## 2016-08-15 LAB — FIBRINOGEN: FIBRINOGEN: 562 mg/dL — AB (ref 210–475)

## 2016-08-15 LAB — PHOSPHATIDYLSERINE ANTIBODIES
PHOSPHATYDALSERINE, IGA: 1 {APS'U} (ref 0–20)
PHOSPHATYDALSERINE, IGG: 3 {GPS'U} (ref 0–11)
Phosphatydalserine, IgM: 4 MPS IgM (ref 0–25)

## 2016-08-15 LAB — GLOMERULAR BASEMENT MEMBRANE ANTIBODIES: GBM AB: 5 U (ref 0–20)

## 2016-08-15 LAB — PREPARE RBC (CROSSMATCH)

## 2016-08-15 LAB — COOXEMETRY PANEL
CARBOXYHEMOGLOBIN: 2.1 % — AB (ref 0.5–1.5)
Methemoglobin: 1.8 % — ABNORMAL HIGH (ref 0.0–1.5)
O2 SAT: 83.3 %
Total hemoglobin: 6.9 g/dL — CL (ref 12.0–16.0)

## 2016-08-15 LAB — PROTIME-INR
INR: 2.37
PROTHROMBIN TIME: 26.3 s — AB (ref 11.4–15.2)

## 2016-08-15 LAB — LACTIC ACID, PLASMA
Lactic Acid, Venous: 3.5 mmol/L (ref 0.5–1.9)
Lactic Acid, Venous: 3.4 mmol/L (ref 0.5–1.9)

## 2016-08-15 LAB — D-DIMER, QUANTITATIVE (NOT AT ARMC)

## 2016-08-15 LAB — ANCA TITERS
Atypical P-ANCA titer: <1:20 {titer}
C-ANCA: <1:20 {titer}
P-ANCA: <1:20 {titer}

## 2016-08-15 LAB — LACTATE DEHYDROGENASE: LDH: 6048 U/L — AB (ref 98–192)

## 2016-08-15 LAB — PROCALCITONIN: Procalcitonin: >150 ng/mL

## 2016-08-15 LAB — HEMOGLOBIN AND HEMATOCRIT, BLOOD
HCT: 26.3 % — ABNORMAL LOW (ref 36.0–46.0)
Hemoglobin: 8.7 g/dL — ABNORMAL LOW (ref 12.0–15.0)

## 2016-08-15 LAB — CK: CK TOTAL: 5732 U/L — AB (ref 38–234)

## 2016-08-15 MED ORDER — AMIODARONE HCL IN DEXTROSE 360-4.14 MG/200ML-% IV SOLN
60.0000 mg/h | INTRAVENOUS | Status: AC
  Administered 2016-08-15 (×2): 60 mg/h via INTRAVENOUS
  Filled 2016-08-15: qty 200

## 2016-08-15 MED ORDER — FUROSEMIDE 10 MG/ML IJ SOLN
160.0000 mg | Freq: Three times a day (TID) | INTRAVENOUS | Status: DC
  Administered 2016-08-15 – 2016-08-16 (×3): 160 mg via INTRAVENOUS
  Filled 2016-08-15: qty 10
  Filled 2016-08-15 (×2): qty 16
  Filled 2016-08-15: qty 10

## 2016-08-15 MED ORDER — SODIUM CHLORIDE 0.9 % IV SOLN: Freq: Once | INTRAVENOUS | Status: DC

## 2016-08-15 MED ORDER — SODIUM CHLORIDE 0.9% FLUSH: 10.0000 mL | INTRAVENOUS | Status: DC | PRN

## 2016-08-15 MED ORDER — MIDAZOLAM HCL 2 MG/2ML IJ SOLN
INTRAMUSCULAR | Status: AC
  Administered 2016-08-15: 2 mg
  Filled 2016-08-15: qty 2

## 2016-08-15 MED ORDER — AMIODARONE HCL IN DEXTROSE 360-4.14 MG/200ML-% IV SOLN
INTRAVENOUS | Status: AC
  Administered 2016-08-15: 19:00:00
  Filled 2016-08-15: qty 200

## 2016-08-15 MED ORDER — AMIODARONE HCL IN DEXTROSE 360-4.14 MG/200ML-% IV SOLN
30.0000 mg/h | INTRAVENOUS | Status: DC
  Administered 2016-08-16 – 2016-08-19 (×7): 30 mg/h via INTRAVENOUS
  Filled 2016-08-15 (×7): qty 200

## 2016-08-15 MED ORDER — SODIUM CHLORIDE 0.9 % IV SOLN
500.0000 mg | Freq: Two times a day (BID) | INTRAVENOUS | Status: DC
  Administered 2016-08-15 – 2016-08-19 (×9): 500 mg via INTRAVENOUS
  Filled 2016-08-15 (×10): qty 5

## 2016-08-15 MED ORDER — CHLORHEXIDINE GLUCONATE CLOTH 2 % EX PADS
6.0000 | MEDICATED_PAD | Freq: Every day | CUTANEOUS | Status: DC
  Administered 2016-08-15 – 2016-08-18 (×4): 6 via TOPICAL

## 2016-08-15 MED ORDER — SODIUM CHLORIDE 0.9% FLUSH
10.0000 mL | Freq: Two times a day (BID) | INTRAVENOUS | Status: DC
  Administered 2016-08-15: 20 mL
  Administered 2016-08-16 – 2016-08-19 (×7): 10 mL

## 2016-08-15 MED ORDER — FENTANYL CITRATE (PF) 100 MCG/2ML IJ SOLN
INTRAMUSCULAR | Status: AC
  Administered 2016-08-15: 50 ug
  Filled 2016-08-15: qty 2

## 2016-08-15 MED ORDER — AMIODARONE LOAD VIA INFUSION
150.0000 mg | Freq: Once | INTRAVENOUS | Status: AC
  Administered 2016-08-15: 150 mg via INTRAVENOUS
  Filled 2016-08-15: qty 83.34

## 2016-08-15 NOTE — Progress Notes (Signed)
  I performed bedside echo today.  LVEF has now normalized. EF 55%. There is LVH and LV appears underfilled. A moderate to large pericardial effusion persists but no evidence of tamponade.   I spoke with Dr. 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 Has suggested solumedrol 1g daily for 3 days and then back to 160q6. No role for cytoxan or other immunosuppressives at this time.   Mental status changes may be anoxic in nature but may also have component of cerebritis.   D/w Dr. Lake Bells at length.  Additional 35 mins CCT.  [name], MD  8:51 PM

## 2016-08-15 NOTE — Progress Notes (Signed)
120cc of 13mcg/ml of Fentanyl wasted in sink with Emerson Monte, RN

## 2016-08-15 NOTE — Progress Notes (Signed)
PULMONARY / CRITICAL CARE MEDICINE   Name: Casey Chapman MRN: [id number] DOB: 09/29/1985    ADMISSION DATE:  08/30/2016 CONSULTATION DATE:  08/20/2016  REFERRING MD:  Dr. Randal Buba   CHIEF COMPLAINT:  HTN   BRIEF SUMMARY:   31 year old female with PMH of autoimmune hemolytic anemia s/p splenectomy, being followed by Dr. Burr Medico in Hematology. Has reported 60 lb weight loss in past 2 years with history of weakness and swelling in lower extremity.  Presented with pain in hands, feet, marked hypertension to the [facility] ER on 7/9.  Family noted development of a rash around chest/neck over the last year and progressive hypertension.  In the [facility] ER she was very hypertensive which was treated with labetalol, not long afterwards she had a cardiac arrest.  She was moved to [facility] later in the day with severe cardiogenic shock, unexplained airspace disease and recurrent episodes of cardiac arrest.  On 7/9 she had at least 4 episodes of cardiac arrest requiring CPR for approximately 30-45 minutes in total.  Echo on 7/9 showed a moderate pericardial effusion, LVEF 20-25%, RV function severely reduced.   SUBJECTIVE:   Febrile yesterday with concerns for sepsis. Empiric ABX. Seizure activity noted yesterday afternoon. Neuro consult and EEG started. CT head done and normal.   VITAL SIGNS: BP (!) 156/85   Pulse (!) 148   Temp 98.8 F (37.1 C)   Resp 15   Ht '5\' 6"'$  (1.676 m)   Wt 73.8 kg (162 lb 11.2 oz)   LMP 08/09/2016 (Exact Date) Comment: negative beta HCG 08/05/2016  SpO2 98%   BMI 26.26 kg/m   HEMODYNAMICS: PAP: (13-23)/(8-17) 21/11 CVP:  [1 mmHg-14 mmHg] 14 mmHg PCWP:  [3 mmHg-5 mmHg] 5 mmHg CO:  [3.9 L/min-8.9 L/min] 7.8 L/min CI:  [2.3 L/min/m2-5.4 L/min/m2] 4.7 L/min/m2  VENTILATOR SETTINGS: Vent Mode: PRVC FiO2 (%):  [40 %-60 %] 40 % Set Rate:  [24 bmp] 24 bmp Vt Set:  [470 mL] 470 mL PEEP:  [8 cmH20] 8 cmH20 Plateau Pressure:  [18 cmH20-24 cmH20] 21 cmH20  INTAKE / OUTPUT: I/O last 3 completed  shifts: In: 9260.9 [I.V.:5075.9; Blood:365; NG/GT:60; IV Piggyback:3760] Out: 75 [Urine:15; Emesis/NG output:300]  PHYSICAL EXAMINATION: General:  Young AA female in bed on vent HENT: NCAT ETT in place PULM: Basilar crackles CV: RRR, no MRG GI: BS+, soft, nontender MSK: normal bulk and tone Neuro: comatose on vent. Dolls eye neg. Gag intact.   LABS:  BMET  Recent Labs Lab 08/14/16 1134 08/14/16 1533 08/14/16 2213 08/15/16 0250  NA 135 135 135 135  K 4.4 4.6 4.6 5.0  CL 96* 98* 98* 101  CO2 20* 20*  --  19*  BUN 46* 48* 48* 54*  CREATININE 3.21* 3.19* 3.30* 3.47*  GLUCOSE 118* 128* 103* 69    Electrolytes  Recent Labs Lab 08/10/2016 0631 09/03/2016 0952 09/04/2016 1653  08/18/2016 2006  08/14/16 1134 08/14/16 1533 08/15/16 0250  CALCIUM 8.2* 8.0* 8.2*  < > 7.3*  < > 6.2* 5.7* 7.4*  MG 2.6* 2.4 2.3  --  1.9  --   --  2.1  --   PHOS 10.6* 9.8* 11.2*  --   --   --   --   --   --   < > = values in this interval not displayed.  CBC  Recent Labs Lab 09/04/2016 1653 08/14/16 0358 08/14/16 2213 08/15/16 0250  WBC 21.8* 24.0*  --  34.1*  HGB 6.3* 9.1* 9.2* 7.0*  HCT  20.0* 26.9* 27.0* 20.6*  PLT 34* 48*  --  48*    Coag's  Recent Labs Lab 08/20/2016 1653  INR 2.90    Sepsis Markers  Recent Labs Lab 08/18/2016 0631  08/11/2016 1000 08/08/2016 1653 08/06/2016 2011 08/14/16 1134 08/15/16 0250  LATICACIDVEN 11.5*  < > 9.0* 17.1* 12.5*  --   --   PROCALCITON 3.08  --   --   --   --  >150.00 >150.00  < > = values in this interval not displayed.  ABG  Recent Labs Lab 08/18/2016 2312 08/14/16 1600 08/14/16 2209  PHART 7.493* 7.408 7.422  PCO2ART 34.3 32.0 37.0  PO2ART 319.0* 181* 419.0*    Liver Enzymes  Recent Labs Lab 08/14/2016 1653 08/14/16 0358  AST 732* 1,901*  ALT 265* 643*  ALKPHOS 58 63  BILITOT 0.6 1.0  ALBUMIN 1.8* 2.0*    Cardiac Enzymes  Recent Labs Lab 08/05/2016 1653 08/24/2016 1820 08/14/16 0358  TROPONINI >65.00* >65.00* >65.00*     Glucose  Recent Labs Lab 08/14/16 1136 08/14/16 1527 08/14/16 2027 08/15/16 0006 08/15/16 0326 08/15/16 0453  GLUCAP 124* 122* 106* 114* 71 84    Imaging Ct Head Wo Contrast  Result Date: 08/14/2016 CLINICAL DATA:  Cardiac arrest yesterday. Multiple seizures. History of malignant hypertension, autoimmune hemolytic anemia. EXAM: CT HEAD WITHOUT CONTRAST TECHNIQUE: Contiguous axial images were obtained from the base of the skull through the vertex without intravenous contrast. COMPARISON:  CT HEAD August 13, 2016 FINDINGS: Limited assessment due to streak artifact from multiple wires and, coli blanket BRAIN: No intraparenchymal hemorrhage, mass effect nor midline shift. Preservation of gray-white matter differentiation. The ventricles and sulci are normal. No acute large vascular territory infarcts. No abnormal extra-axial fluid collections. Basal cisterns are patent. VASCULAR: Unremarkable. SKULL/SOFT TISSUES: No skull fracture. No significant soft tissue swelling. ORBITS/SINUSES: The included ocular globes and orbital contents are normal.Mild paranasal sinus mucosal thickening without air-fluid levels. OTHER: Bilateral punctate parotid sialolith. Life-support lines in place. IMPRESSION: No acute intracranial process ; no CT findings of hypoxic ischemic encephalopathy. Electronically Signed   By: Elon Alas M.D.   On: 08/14/2016 22:13   Dg Chest Port 1 View  Result Date: 08/15/2016 CLINICAL DATA:  Acute respiratory failure. Autoimmune hemolytic anemia. EXAM: PORTABLE CHEST 1 VIEW COMPARISON:  08/14/2016. FINDINGS: Persistent enlargement of cardiac silhouette. Slight decrease lung volumes. Continued improvement in pulmonary edema. Support tubes and lines are stable. IMPRESSION: Stable cardiac silhouette. Continued improvement in pulmonary edema. Electronically Signed   By: Staci Righter M.D.   On: 08/15/2016 08:44     STUDIES:  CTA Chest 05/02/16 >> Cardiomegaly with trace pericardial  effusion for age. There is mild diffuse anasarca. Nonspecific retroclavicular, subpectoral, and bilateral axillary lymphadenopathy. Findings have been chronic unstable back to 2016 CT. No acute pulmonary embolus. Small hiatal hernia with fluid in the distal esophagus likely related to reflux.Scattered pulmonary blebs and bulla. No pneumonic consolidation, dominant mass, effusion or pneumothorax. CXR 7/9 >> Endotracheal tube tip approximately 2.3 cm superior to the carina. Moderate to marked cardiomegaly with globular cardiac configuration, consider pericardial effusion Development of hazy bilateral pulmonary opacity suspicious for vascular congestion and pulmonary edema CT Head 7/9 >> No acute process CTA Chest / ABD 7/9 >> stable cardiomegaly, large pericardial effusion (increased since 04/2016), no evidenceof aortic dissection or aneurysm, no PE.  Vascular congestion and pulmonary edema, scattered cystic changes of the lung, heterogeneous enhancement of the kidneys concerning for pyelonephritis, diffusely dilated small bowel concerning for  ileus, diffuse subcutaneous edema / anasarca UDS 7/9 >> positive THC Echo 7/9 > LVEF 20-25%, mild LVH, mild LAE, moderate pericardial effusion CT head 7/10 > nonacute  CULTURES: UA 7/9 >>  resp culture 7/9 >>  ANTIBIOTICS: Rocephin 7/9 >> 7/9 Cefepime 7/9 >> Vanc 7/9 >>  SIGNIFICANT EVENTS: 7/08  Presents to ED with HTN > arrested in ER 7/9 Cardiac arrest x4, moved to [facility] severe cardiomyopathy/acidosis  LINES/TUBES: ETT 7/9 >>  R IJ TLC 7/9 >>   AUTOIMMUNE 7/9  C3 >> normal C4 >> normal GBM >>> SSA - greater than 8 SSB > 5.1 DSDNA >>  1 Anti-scleroderma >> < 0.2 ANCA >> <3.5 ACE >> 33 RA >> normal CRP >> 2.6 ESR >> 20 Phosphatidylserine antibodies >>normal  DISCUSSION: 31 y/o female with underlying autoimmune hemolytic anemia presented on 7/9 with extremity pain and severe hypertension; when treated with labetalol developed cardiac  arrest.  Noted to have a severe cardiomyopathy on exam, active urinary sediment.  Family notes severe pulmonary hypertension for weeks and an evolving rash over neck/chest.  There is concern for an underlying connective tissue disease.  Unifying diagnosis at this point is not certain.   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 Attempt to wean off epi today.  Continue milrinone Appreciate advanced heart failure team assistance.  Continue solumedrol Follow up autoimmune labs  RENAL A:   AKI> anuric Metabolic acidosis> lactic, renal failure Renal enhancement on CT angiogram> possibly consistent with medical renal disease like glomerulonephritis Hypokalemia Hypocalcemia Concern for glomerulonephritis > active urinary sediment on U/A P:   Renal dose meds Replace electrolytes as needed Repeat Lactic acid Repeat BMP, ABG this PM and in AM Nearing need for CRRT  GASTROINTESTINAL A:   Weight loss in last year Ileus on CT P:   Holding tube feed with suspected ileus.  Continue PPI > via tube  HEMATOLOGIC A:   Hemolytic Anemia s/p Splenectomy  -CT A/P in 2015 showed adenopathy E inguinal, external iliac -CT CAP in 2016 showed axilla, supraclavicular and subpectoral adenopathy > progressive since 2011 -Left Axially Lymph node biopsy in 2016 > 2 Benign lymph nodes, no malignancy  P:  Monitor for bleeding CBC daily Transfusion for Hgb <7gm/dL 1 unit PRBC today for Hgb 7  INFECTIOUS A:   Pyelonephritis? Doubt Aspiration pneumonia?  doubt P:   Add blood cultures Will empirically cover with broad antibiotics for now  ENDOCRINE A:   Hyperglycemia  P:   SSI Stop dextrose containing infusions  NEUROLOGIC A:   Acute encephalopathy post arrest> high likelihood of anoxic injury Seizures THC use P:   RASS goal -1 to -2 PRN sedation Keppra once, further dosing if indicated per neurology Ativan PRN seizure Continuous EEG ongoing.   FAMILY  - Updates: Mother and boyfriend updated at length this AM by myself and Dr. Haroldine Laws   - Inter-disciplinary family meet or Palliative Care meeting due by:  08/20/2016  [name], AGACNP-BC [name] Pager [phone] or [phone]  08/15/2016 8:55 AM

## 2016-08-15 NOTE — Progress Notes (Signed)
Advanced Heart Failure Team Progress Note   HPI:    Remains intubated. Nonresponsive. Had seizure last night and started on ativan. Now getting ECG  On epi 4, NE 20 and milrinone 0.5. Anuric. Being transfused now for HGB 7  CVP 8 PA 21/10 PCWP 4  CO > 7L    Allergies:  No Known Allergies  Objective:    Vital Signs:   Temp:  [96.4 F (35.8 C)-101.7 F (38.7 C)] 98.8 F (37.1 C) (07/11 0800) Pulse Rate:  [99-264] 148 (07/11 0800) Resp:  [15-32] 15 (07/11 0800) BP: (84-156)/(53-104) 156/85 (07/11 0800) SpO2:  [78 %-100 %] 98 % (07/11 0800) Arterial Line BP: (82-166)/(49-90) 166/90 (07/11 0800) FiO2 (%):  [40 %-60 %] 40 % (07/11 0748) Weight:  [162 lb 11.2 oz (73.8 kg)] 162 lb 11.2 oz (73.8 kg) (07/11 0500) Last BM Date: 08/15/16  Weight change: Filed Weights   08/15/2016 0604 08/14/16 0400 08/15/16 0500  Weight: 130 lb 15.3 oz (59.4 kg) 149 lb 14.6 oz (68 kg) 162 lb 11.2 oz (73.8 kg)    Intake/Output:   Intake/Output Summary (Last 24 hours) at 08/15/16 0847 Last data filed at 08/15/16 0800  Gross per 24 hour  Intake          6214.69 ml  Output              100 ml  Net          6114.69 ml      Physical Exam    General:  Intubated. Nonresponsive. +gag. Negative Doll's eys HEENT: normal ETT Neck: supple. RIJ TLC. LIJ swan  No lymphadenopathy or thryomegaly appreciated. Cor: PMI nondisplaced. Tachy regular  Lungs: +rhonchi  Abdomen: soft, nontender, + distended. No hepatosplenomegaly. No bruits or masses. Hypoactive bowel sounds. Extremities: no cyanosis, clubbing, rash, 1-2+ edema Neuro: intubated nonresponsive    Telemetry   Sinus tach 130s. Personally reviewed   EKG    NA  Labs   Basic Metabolic Panel:  Recent Labs Lab 08/15/2016 0631 08/30/2016 0952 08/16/2016 1653  08/18/2016 2006 08/25/2016 2256 08/14/16 0358 08/14/16 1134 08/14/16 1533 08/14/16 2213 08/15/16 0250  NA 134* 134* 159*  < > 136 135 136 135 135 135 135  K 5.1 5.6* 5.4*  <  > 3.0* 3.4* 4.7 4.4 4.6 4.6 5.0  CL 99* 100* 92*  < > 95* 96* 97* 96* 98* 98* 101  CO2 15* 13* 40*  < > 20* 20* 20* 20* 20*  --  19*  GLUCOSE 88 131* 133*  < > 183* 205* 118* 118* 128* 103* 69  BUN 26* 30* 32*  < > 38* 40* 42* 46* 48* 48* 54*  CREATININE 1.46* 1.77* 2.23*  < > 2.61* 2.77* 2.95* 3.21* 3.19* 3.30* 3.47*  CALCIUM 8.2* 8.0* 8.2*  < > 7.3* 7.0* 6.8* 6.2* 5.7*  --  7.4*  MG 2.6* 2.4 2.3  --  1.9  --   --   --  2.1  --   --   PHOS 10.6* 9.8* 11.2*  --   --   --   --   --   --   --   --   < > = values in this interval not displayed.  Liver Function Tests:  Recent Labs Lab 08/14/2016 1653 08/14/16 0358  AST 732* 1,901*  ALT 265* 643*  ALKPHOS 58 63  BILITOT 0.6 1.0  PROT 3.9* 4.5*  ALBUMIN 1.8* 2.0*   No results for input(s): LIPASE,  AMYLASE in the last 168 hours. No results for input(s): AMMONIA in the last 168 hours.  CBC:  Recent Labs Lab 08/31/2016 0631 09/03/2016 0952 08/31/2016 1653 08/14/16 0358 08/14/16 2213 08/15/16 0250  WBC 13.6* 16.5* 21.8* 24.0*  --  34.1*  NEUTROABS  --   --  13.5*  --   --  24.5*  HGB 8.2* 8.6* 6.3* 9.1* 9.2* 7.0*  HCT 26.4* 26.8*  26.9* 20.0* 26.9* 27.0* 20.6*  MCV 91.0 90.2 88.9 87.9  --  84.4  PLT 64* 40* 34* 48*  --  48*    Cardiac Enzymes:  Recent Labs Lab 08/18/2016 0631 08/08/2016 0952 08/25/2016 1653 08/24/2016 1820 08/14/16 0358  TROPONINI 7.21* 42.94* >65.00* >65.00* >65.00*    BNP: BNP (last 3 results)  Recent Labs  05/02/16 1740  BNP 131.6*    ProBNP (last 3 results) No results for input(s): PROBNP in the last 8760 hours.   CBG:  Recent Labs Lab 08/14/16 1527 08/14/16 2027 08/15/16 0006 08/15/16 0326 08/15/16 0453  GLUCAP 122* 106* 114* 71 84    Coagulation Studies:  Recent Labs  08/29/2016 1653  LABPROT 31.0*  INR 2.90     Imaging   Ct Head Wo Contrast  Result Date: 08/14/2016 CLINICAL DATA:  Cardiac arrest yesterday. Multiple seizures. History of malignant hypertension, autoimmune  hemolytic anemia. EXAM: CT HEAD WITHOUT CONTRAST TECHNIQUE: Contiguous axial images were obtained from the base of the skull through the vertex without intravenous contrast. COMPARISON:  CT HEAD August 13, 2016 FINDINGS: Limited assessment due to streak artifact from multiple wires and, coli blanket BRAIN: No intraparenchymal hemorrhage, mass effect nor midline shift. Preservation of gray-white matter differentiation. The ventricles and sulci are normal. No acute large vascular territory infarcts. No abnormal extra-axial fluid collections. Basal cisterns are patent. VASCULAR: Unremarkable. SKULL/SOFT TISSUES: No skull fracture. No significant soft tissue swelling. ORBITS/SINUSES: The included ocular globes and orbital contents are normal.Mild paranasal sinus mucosal thickening without air-fluid levels. OTHER: Bilateral punctate parotid sialolith. Life-support lines in place. IMPRESSION: No acute intracranial process ; no CT findings of hypoxic ischemic encephalopathy. Electronically Signed   By: Elon Alas M.D.   On: 08/14/2016 22:13   Dg Chest Port 1 View  Result Date: 08/15/2016 CLINICAL DATA:  Acute respiratory failure. Autoimmune hemolytic anemia. EXAM: PORTABLE CHEST 1 VIEW COMPARISON:  08/14/2016. FINDINGS: Persistent enlargement of cardiac silhouette. Slight decrease lung volumes. Continued improvement in pulmonary edema. Support tubes and lines are stable. IMPRESSION: Stable cardiac silhouette. Continued improvement in pulmonary edema. Electronically Signed   By: Staci Righter M.D.   On: 08/15/2016 08:44     Medications:     Current Medications: . chlorhexidine gluconate (MEDLINE KIT)  15 mL Mouth Rinse BID  . Chlorhexidine Gluconate Cloth  6 each Topical Q0600  . heparin  5,000 Units Subcutaneous Q8H  . insulin aspart  0-20 Units Subcutaneous Q4H  . mouth rinse  15 mL Mouth Rinse Q2H  . methylPREDNISolone (SOLU-MEDROL) injection  125 mg Intravenous Q6H  . mupirocin ointment  1  application Nasal BID  . pantoprazole (PROTONIX) IV  40 mg Intravenous Q24H    Infusions: . sodium chloride    . sodium chloride 75 mL/hr at 08/15/16 0700  . sodium chloride    . ceFEPime (MAXIPIME) IV Stopped (08/14/16 1736)  . epinephrine 4 mcg/min (08/15/16 0700)  . fentaNYL infusion INTRAVENOUS Stopped (08/14/16 0400)  . milrinone 0.5 mcg/kg/min (08/15/16 0700)  . niCARDipine Stopped (08/18/2016 1042)  . nitroGLYCERIN Stopped (08/28/2016 0944)  .  norepinephrine (LEVOPHED) Adult infusion 20 mcg/min (08/15/16 0700)  . 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   Assessment/Plan   1. Cardiogenic shock  - Trop > 65% - Echo on 7/9 shows LVEF 20-25% with severe RV dysfunction - I performed repeat bedside echo 7/10. LV thick and underfilled EF ~30-35%. Pericardial effusion somewhat smaller - Swan numbers remain with low filling pressures and normal cardiac output on triple pressors 2. Myopericarditis with moderate to large pericardial effusion - Suspect due to MCTD or viral myocarditis - ANA + (not titer available)  Sjogren's antibodies very high  3. Cardiac arrest  --7/9 PEA arrest 4. Acute respiratory failure --CXR with marked clearing after steroids. Suggestive of acute pneumonitis picture --O2 requirement decreaseing 5. AKI -now anuric 6. Shock liver 7. Accelerated HTN over past several weeks 8. Auto-immune hemolytic anemia s/p splenectomy 9. Anoxic brain injury - Head CT 7/10 negative 10. Seizure activity -Neurology has seen. Getting ECG> 11. Symptomatic anemia -Transfuse  She remains critically ill on triple pressors. Suspect fulminant myocarditis due to MCTD vs viral. Impressive response to steroids makes me lean toward MCTD. Sjogren's ab +. Continue steroids.  Cardiorespiratory status improving. Luiz Blare numbers  reviewed personally. Will wean pressors as tolerated. Can likely get swan out soon. I will repeat limited echo today.   Neuro status very concerning. Suspect anoxic brain injury. Appreciate Neuro input. Continue to follow closely.   Will likely need CVVHD soon. Family updated at bedside. D/w CCM at bedside.  CRITICAL CARE Performed by: [name]  Total critical care time: 45 minutes  Critical care time was exclusive of separately billable procedures and treating other patients.  Critical care was necessary to treat or prevent imminent or life-threatening deterioration.  Critical care was time spent personally by me (independent of midlevel providers or residents) on the following activities: development of treatment plan with patient and/or surrogate as well as nursing, discussions with consultants, evaluation of patient's response to treatment, examination of patient, obtaining history from patient or surrogate, ordering and performing treatments and interventions, ordering and review of laboratory studies, ordering and review of radiographic studies, pulse oximetry and re-evaluation of patient's condition.      Length of Stay: 2  [name], MD  08/15/2016, 8:47 AM  Advanced Heart Failure Team Pager [phone] (M-F; 7a - 4p)  Please contact [facility] for night-coverage after hours (4p -7a ) and weekends on [email or web address]

## 2016-08-15 NOTE — Plan of Care (Signed)
Problem: Activity: Goal: Ability to tolerate increased activity will improve Outcome: Not Progressing Pt not responsive. Unable to get out of bed.  Problem: Nutritional: Goal: Intake of prescribed amount of daily calories will improve Outcome: Not Progressing Unable to initiate tube feeding due to ileus.   Problem: Respiratory: Goal: Ability to maintain a clear airway and adequate ventilation will improve Outcome: Progressing Levels of oxygenation have decreased, but still requiring ventilator support.

## 2016-08-15 NOTE — Progress Notes (Signed)
CRITICAL VALUE ALERT  Critical Value:  Calcium 6.3  Date & Time Notied:  7/11 1447  Provider Notified: [name]  Orders Received/Actions taken: collect ionized calcium

## 2016-08-15 NOTE — Progress Notes (Signed)
Subjective: No clinical generalized seizures overnight however there was some noted facial twitching and possible arm jerking. Patient currently showing no clinical seizures. She is on no paralytics or sedatives. Patient is breathing over the vent and does have cranial nerve function but not following commands.  Objective: Current vital signs: BP 115/64   Pulse (!) 151   Temp 99 F (37.2 C)   Resp (!) 27   Ht '5\' 6"'$  (1.676 m)   Wt 73.8 kg (162 lb 11.2 oz)   LMP 08/09/2016 (Exact Date) Comment: negative beta HCG 08/27/2016  SpO2 100%   BMI 26.26 kg/m  Vital signs in last 24 hours: Temp:  [96.4 F (35.8 C)-101.7 F (38.7 C)] 99 F (37.2 C) (07/11 0915) Pulse Rate:  [99-264] 151 (07/11 0915) Resp:  [15-32] 27 (07/11 0915) BP: (84-156)/(53-104) 115/64 (07/11 0915) SpO2:  [78 %-100 %] 100 % (07/11 0915) Arterial Line BP: (82-166)/(49-90) 96/59 (07/11 0915) FiO2 (%):  [40 %-50 %] 40 % (07/11 0748) Weight:  [73.8 kg (162 lb 11.2 oz)] 73.8 kg (162 lb 11.2 oz) (07/11 0500)  Intake/Output from previous day: 07/10 0701 - 07/11 0700 In: 6501.5 [I.V.:3031.5; Blood:30; NG/GT:30; IV [id number]:8250] Out: 100 [Emesis/NG output:100] Intake/Output this shift: Total I/O In: 538 [I.V.:238; Blood:300] Out: -  Nutritional status: Diet NPO time specified  ROS:                                                                                                                                       History obtained from unobtainable from patient due to Intubation      Neurologic Exam: Mental Status: Patient does not respond to verbal stimuli.  Does not respond to deep sternal rub.  Does not follow commands.  No verbalizations noted.  Cranial Nerves: II: patient does not respond confrontation bilaterally, pupils right 2 mm, left 2 mm,and reactive bilaterally III,IV,VI: doll's response present bilaterally. V,VII: corneal reflex present bilaterally  VIII: patient does not respond to verbal  stimuli IX,X: gag reflex present, XI: trapezius strength unable to test bilaterally XII: tongue strength unable to test Motor: Extremities flaccid throughout.  No spontaneous movement noted.  No purposeful movements noted. Sensory: Does not respond to noxious stimuli in any extremity. Deep Tendon Reflexes:  Absent throughout. Plantars: equivocal bilaterally Cerebellar: Unable to perform    Lab Results: Basic Metabolic Panel:  Recent Labs Lab 09/03/2016 0631 09/02/2016 0952 08/31/2016 1653  08/11/2016 2006 08/27/2016 2256 08/14/16 0358 08/14/16 1134 08/14/16 1533 08/14/16 2213 08/15/16 0250  NA 134* 134* 159*  < > 136 135 136 135 135 135 135  K 5.1 5.6* 5.4*  < > 3.0* 3.4* 4.7 4.4 4.6 4.6 5.0  CL 99* 100* 92*  < > 95* 96* 97* 96* 98* 98* 101  CO2 15* 13* 40*  < > 20* 20* 20* 20* 20*  --  19*  GLUCOSE 88 131* 133*  < >  183* 205* 118* 118* 128* 103* 69  BUN 26* 30* 32*  < > 38* 40* 42* 46* 48* 48* 54*  CREATININE 1.46* 1.77* 2.23*  < > 2.61* 2.77* 2.95* 3.21* 3.19* 3.30* 3.47*  CALCIUM 8.2* 8.0* 8.2*  < > 7.3* 7.0* 6.8* 6.2* 5.7*  --  7.4*  MG 2.6* 2.4 2.3  --  1.9  --   --   --  2.1  --   --   PHOS 10.6* 9.8* 11.2*  --   --   --   --   --   --   --   --   < > = values in this interval not displayed.  Liver Function Tests:  Recent Labs Lab 08/14/2016 1653 08/14/16 0358  AST 732* 1,901*  ALT 265* 643*  ALKPHOS 58 63  BILITOT 0.6 1.0  PROT 3.9* 4.5*  ALBUMIN 1.8* 2.0*   No results for input(s): LIPASE, AMYLASE in the last 168 hours. No results for input(s): AMMONIA in the last 168 hours.  CBC:  Recent Labs Lab 08/25/2016 0631 09/04/2016 3664 08/30/2016 1653 08/14/16 0358 08/14/16 2213 08/15/16 0250 08/15/16 0841  WBC 13.6* 16.5* 21.8* 24.0*  --  34.1*  --   NEUTROABS  --   --  13.5*  --   --  24.5*  --   HGB 8.2* 8.6* 6.3* 9.1* 9.2* 7.0* 8.7*  HCT 26.4* 26.8*  26.9* 20.0* 26.9* 27.0* 20.6* 26.3*  MCV 91.0 90.2 88.9 87.9  --  84.4  --   PLT 64* 40* 34* 48*  --  48*   --     Cardiac Enzymes:  Recent Labs Lab 08/12/2016 0631 08/23/2016 0952 08/27/2016 1653 09/04/2016 1820 08/14/16 0358  TROPONINI 7.21* 42.94* >65.00* >65.00* >65.00*    Lipid Panel:  Recent Labs Lab 08/12/2016 0631  TRIG 252*    CBG:  Recent Labs Lab 08/14/16 2027 08/15/16 0006 08/15/16 0326 08/15/16 0453 08/15/16 0754  GLUCAP 106* 114* 71 37 78    Microbiology: Results for orders placed or performed during the hospital encounter of 08/14/2016  MRSA PCR Screening     Status: None   Collection Time: 08/18/2016  7:10 AM  Result Value Ref Range Status   MRSA by PCR NEGATIVE NEGATIVE Final    Comment:        The GeneXpert MRSA Assay (FDA approved for NASAL specimens only), is one component of a comprehensive MRSA colonization surveillance program. It is not intended to diagnose MRSA infection nor to guide or monitor treatment for MRSA infections.   MRSA PCR Screening     Status: Abnormal   Collection Time: 08/28/2016  9:52 AM  Result Value Ref Range Status   MRSA by PCR POSITIVE (A) NEGATIVE Final    Comment:        The GeneXpert MRSA Assay (FDA approved for NASAL specimens only), is one component of a comprehensive MRSA colonization surveillance program. It is not intended to diagnose MRSA infection nor to guide or monitor treatment for MRSA infections. RESULT CALLED TO, READ BACK BY AND VERIFIED WITH: SUGGS,Z @ 1236 ON [id number] BY POTEAT,S DELTA CHECK NOTED     Coagulation Studies:  Recent Labs  09/02/2016 1653  LABPROT 31.0*  INR 2.90    Imaging: Ct Head Wo Contrast  Result Date: 08/14/2016 CLINICAL DATA:  Cardiac arrest yesterday. Multiple seizures. History of malignant hypertension, autoimmune hemolytic anemia. EXAM: CT HEAD WITHOUT CONTRAST TECHNIQUE: Contiguous axial images were obtained from the base of the skull  through the vertex without intravenous contrast. COMPARISON:  CT HEAD August 13, 2016 FINDINGS: Limited assessment due to streak artifact  from multiple wires and, coli blanket BRAIN: No intraparenchymal hemorrhage, mass effect nor midline shift. Preservation of gray-white matter differentiation. The ventricles and sulci are normal. No acute large vascular territory infarcts. No abnormal extra-axial fluid collections. Basal cisterns are patent. VASCULAR: Unremarkable. SKULL/SOFT TISSUES: No skull fracture. No significant soft tissue swelling. ORBITS/SINUSES: The included ocular globes and orbital contents are normal.Mild paranasal sinus mucosal thickening without air-fluid levels. OTHER: Bilateral punctate parotid sialolith. Life-support lines in place. IMPRESSION: No acute intracranial process ; no CT findings of hypoxic ischemic encephalopathy. Electronically Signed   By: Elon Alas M.D.   On: 08/14/2016 22:13   Dg Chest Port 1 View  Result Date: 08/15/2016 CLINICAL DATA:  Acute respiratory failure. Autoimmune hemolytic anemia. EXAM: PORTABLE CHEST 1 VIEW COMPARISON:  08/14/2016. FINDINGS: Persistent enlargement of cardiac silhouette. Slight decrease lung volumes. Continued improvement in pulmonary edema. Support tubes and lines are stable. IMPRESSION: Stable cardiac silhouette. Continued improvement in pulmonary edema. Electronically Signed   By: Staci Righter M.D.   On: 08/15/2016 08:44   Dg Chest Port 1 View  Result Date: 08/14/2016 CLINICAL DATA:  Hypoxia EXAM: PORTABLE CHEST 1 VIEW COMPARISON:  August 13, 2016 FINDINGS: Endotracheal tube tip is 4.4 cm above the carina. Nasogastric tube tip and side port are below the diaphragm. Swan-Ganz catheter tip is in the proximal right intralobar pulmonary artery. There is a right jugular catheter with tip in superior vena cava. No pneumothorax. There has been significant resolution of pulmonary edema compared to 1 day prior. There remains diffuse interstitial prominence and patchy consolidation in the right lower lung zone. Left lung is now clear. Heart is slightly enlarged with pulmonary  vascularity within normal limits. No adenopathy. No evident bone lesions. IMPRESSION: Tube and catheter positions as described without evident pneumothorax. Significant clearing of pulmonary edema bilaterally. Edema with patchy airspace consolidation remains in the right lower lobe. There may be a degree of pneumonia in the right lower lobe. Left lung now clear. Heart slightly enlarged, less prominent than 1 day prior. Electronically Signed   By: Lowella Grip III M.D.   On: 08/14/2016 08:12   Dg Chest Port 1 View  Result Date: 08/07/2016 CLINICAL DATA:  31 year old female status post intubation. EXAM: PORTABLE CHEST 1 VIEW COMPARISON:  Chest x-ray 08/22/2016. FINDINGS: An endotracheal tube is in place with tip 4.6 cm above the carina. There is a right-sided internal jugular central venous catheter with tip terminating in the superior cavoatrial junction. New left internal jugular central venous Cordis through which a Swan-Ganz catheter has been passed into a right descending pulmonary artery branch. Transcutaneous defibrillator pads project over the lower left hemithorax. No pneumothorax. There is cephalization of the pulmonary vasculature, indistinctness of the interstitial markings, and patchy airspace disease throughout the lungs bilaterally suggestive of moderate pulmonary edema. Trace bilateral pleural effusions. Moderate enlargement of the cardiopericardial silhouette could suggest cardiomegaly and/or enlarging pericardial effusion. Upper mediastinal contours are within normal limits. IMPRESSION: 1. Support apparatus, as above. 2. The appearance the chest suggests worsening congestive heart failure, as detailed above. Electronically Signed   By: Vinnie Langton M.D.   On: 08/31/2016 17:21    Medications:  Scheduled: . chlorhexidine gluconate (MEDLINE KIT)  15 mL Mouth Rinse BID  . Chlorhexidine Gluconate Cloth  6 each Topical Q0600  . heparin  5,000 Units Subcutaneous Q8H  . insulin aspart  0-20 Units Subcutaneous Q4H  . mouth rinse  15 mL Mouth Rinse Q2H  . methylPREDNISolone (SOLU-MEDROL) injection  125 mg Intravenous Q6H  . mupirocin ointment  1 application Nasal BID  . pantoprazole (PROTONIX) IV  40 mg Intravenous Q24H   Continuous: . sodium chloride    . sodium chloride 75 mL/hr at 08/15/16 0900  . sodium chloride    . ceFEPime (MAXIPIME) IV Stopped (08/14/16 1736)  . epinephrine 3 mcg/min (08/15/16 0930)  . fentaNYL infusion INTRAVENOUS Stopped (08/14/16 0400)  . milrinone 0.5 mcg/kg/min (08/15/16 0900)  . norepinephrine (LEVOPHED) Adult infusion 20 mcg/min (08/15/16 0900)  . sodium chloride     DYN:XGZFPO chloride, acetaminophen (TYLENOL) oral liquid 160 mg/5 mL, fentaNYL, LORazepam  Assessment/Plan: 31 yo F with new onset focal seizures and coma following cardiac arrest. High possibility for anoxic injury. She will need repeat head imaging patient currently hooked up to LTM. Awaiting EEG reading however currently does not look as though she's having any active seizure activity on EEG. Unfortunately her creatinine has increased from 1.46-3.47. I have discussed with her pharmacy about renal dosing Keppra as to not continually injure patient's kidneys. Unfortunately her liver function on allowing [place] to use any antiepileptic medications that are metabolized through the liver as this could cause more damage.    Recommendations: 1) repeat head CT on 08/16/2016 2) continuous EEG 3) Keppra 500 mg BID(high dose given renal function)-will follow his kidney function and make adjustments at that time. 4) Neurology will follow.   Etta Quill PA-C [facility] [phone]  08/15/2016, 9:52 AM   Neuro hospitalist addendum Patient seen and examined with Etta Quill PA-C. Please see detailed examination above. Poor examination at this time with only brainstem reflexes present. But it is too early in the course to be able to prognosticate.  IMP: 31 year old  female with focal seizures following cardiac arrest. Suspect underlying autoimmune etiology of cardiac arrest given her multisystem involvement, skin rash etc.  Hypoxic/anoxic brain injury Seizures Status epilepticus  Recommendations -Repeat head CT tomorrow - continuous EEG -Keppra at 500 twice a day now. If there is no plan for dialysis and she has poor urinary output, then can adjust the dose to 250 mg twice a day. -We will follow with you. Please call with questions.  -- Amie Portland, MD [facility] [phone]  If 7pm to 7am, please call on call as listed on AMION.

## 2016-08-15 NOTE — Progress Notes (Signed)
[facility] Progress Note Patient Name: Casey Chapman DOB: Dec 27, 1985 MRN: [id number]   Date of Service  08/15/2016  HPI/Events of Note  Hgb = 7.0.  eICU Interventions  Will transfuse 1 unit PRBC now.      Intervention Category Major Interventions: Other:  Lysle Dingwall 08/15/2016, 4:50 AM

## 2016-08-15 NOTE — Consult Note (Addendum)
Casey Chapman is an 31 y.o. female referred by Dr Lake Bells   Chief Complaint: ARF HPI: 31yo BF admitted for PEA arrest in ER after given labetalol for HTN.  Has hx autoimmune HA and sp splenectomy 2011 followed by Dr Burr Medico.  Since ER she has had intermittent issues with hypotension and currently is on milrinone, levo and epi.  CT angio chest/Abd with contrast on 08/18/2016.  Scr in 3/18 was 0.4 and on admission 08/12/16 .88 and since then has progressively increased to 3.47.  Plt ct was Nl in March and on admission 64,000 with Hg 9.5 (was 11 in March).  UO has been minimal.  ANA pos speckled 1:320, antiDNA 1, ANCA neg, antiGBM neg, Nl complement.       Past Medical History:  Diagnosis Date  . Chlamydia 04/16/12   Treated   . Eczema   . Hemolytic anemia ([place]) 2012   Autoimmune     Past Surgical History:  Procedure Laterality Date  . SPLENECTOMY  2012   . TONSILLECTOMY  1990    Family History  Problem Relation Age of Onset  . Diabetes Mother   . Hypertension Mother   . Depression Mother   . Diabetes Brother   . Diabetes Maternal Grandmother   . Diabetes Paternal Grandmother   . Hypertension Paternal Grandmother   . Thyroid disease Father        Thyroid removed   Social History:  reports that she has never smoked. She has never used smokeless tobacco. She reports that she drinks alcohol. She reports that she does not use drugs.  Allergies: No Known Allergies  Medications Prior to Admission  Medication Sig Dispense Refill  . acetaminophen (TYLENOL) 500 MG tablet Take 500-1,000 mg by mouth every 6 (six) hours as needed for mild pain, moderate pain, fever or headache. pain     . furosemide (LASIX) 20 MG tablet Take 1 tablet (20 mg total) by mouth daily. (Patient taking differently: Take 20 mg by mouth daily as needed for edema. ) 5 tablet 0  . hydrocortisone cream 0.5 % Apply 1 application topically 2 (two) times daily as needed (for itching on arms/legs).     Marland Kitchen ibuprofen (ADVIL,MOTRIN) 200 MG  tablet Take 400-800 mg by mouth every 6 (six) hours as needed for fever, headache, mild pain, moderate pain or cramping.        Lab Results: UA: 6-30, 6-30, >300 protein  Recent Labs  08/31/2016 1653 08/14/16 0358 08/14/16 2213 08/15/16 0250 08/15/16 0841  WBC 21.8* 24.0*  --  34.1*  --   HGB 6.3* 9.1* 9.2* 7.0* 8.7*  HCT 20.0* 26.9* 27.0* 20.6* 26.3*  PLT 34* 48*  --  48*  --    BMET  Recent Labs  08/31/2016 0631 08/12/2016 0952 08/07/2016 1653  08/14/16 1134 08/14/16 1533 08/14/16 2213 08/15/16 0250  NA 134* 134* 159*  < > 135 135 135 135  K 5.1 5.6* 5.4*  < > 4.4 4.6 4.6 5.0  CL 99* 100* 92*  < > 96* 98* 98* 101  CO2 15* 13* 40*  < > 20* 20*  --  19*  GLUCOSE 88 131* 133*  < > 118* 128* 103* 69  BUN 26* 30* 32*  < > 46* 48* 48* 54*  CREATININE 1.46* 1.77* 2.23*  < > 3.21* 3.19* 3.30* 3.47*  CALCIUM 8.2* 8.0* 8.2*  < > 6.2* 5.7*  --  7.4*  PHOS 10.6* 9.8* 11.2*  --   --   --   --   --   < > =  values in this interval not displayed. LFT  Recent Labs  08/15/16 1100  PROT 4.9*  ALBUMIN 2.5*  AST 1,155*  ALT 668*  ALKPHOS 62  BILITOT 3.3*  BILIDIR 2.2*  IBILI 1.1*   Ct Head Wo Contrast  Result Date: 08/14/2016 CLINICAL DATA:  Cardiac arrest yesterday. Multiple seizures. History of malignant hypertension, autoimmune hemolytic anemia. EXAM: CT HEAD WITHOUT CONTRAST TECHNIQUE: Contiguous axial images were obtained from the base of the skull through the vertex without intravenous contrast. COMPARISON:  CT HEAD August 13, 2016 FINDINGS: Limited assessment due to streak artifact from multiple wires and, coli blanket BRAIN: No intraparenchymal hemorrhage, mass effect nor midline shift. Preservation of gray-white matter differentiation. The ventricles and sulci are normal. No acute large vascular territory infarcts. No abnormal extra-axial fluid collections. Basal cisterns are patent. VASCULAR: Unremarkable. SKULL/SOFT TISSUES: No skull fracture. No significant soft tissue swelling.  ORBITS/SINUSES: The included ocular globes and orbital contents are normal.Mild paranasal sinus mucosal thickening without air-fluid levels. OTHER: Bilateral punctate parotid sialolith. Life-support lines in place. IMPRESSION: No acute intracranial process ; no CT findings of hypoxic ischemic encephalopathy. Electronically Signed   By: Elon Alas M.D.   On: 08/14/2016 22:13   Dg Chest Port 1 View  Result Date: 08/15/2016 CLINICAL DATA:  Acute respiratory failure. Autoimmune hemolytic anemia. EXAM: PORTABLE CHEST 1 VIEW COMPARISON:  08/14/2016. FINDINGS: Persistent enlargement of cardiac silhouette. Slight decrease lung volumes. Continued improvement in pulmonary edema. Support tubes and lines are stable. IMPRESSION: Stable cardiac silhouette. Continued improvement in pulmonary edema. Electronically Signed   By: Staci Righter M.D.   On: 08/15/2016 08:44   Dg Chest Port 1 View  Result Date: 08/14/2016 CLINICAL DATA:  Hypoxia EXAM: PORTABLE CHEST 1 VIEW COMPARISON:  August 13, 2016 FINDINGS: Endotracheal tube tip is 4.4 cm above the carina. Nasogastric tube tip and side port are below the diaphragm. Swan-Ganz catheter tip is in the proximal right intralobar pulmonary artery. There is a right jugular catheter with tip in superior vena cava. No pneumothorax. There has been significant resolution of pulmonary edema compared to 1 day prior. There remains diffuse interstitial prominence and patchy consolidation in the right lower lung zone. Left lung is now clear. Heart is slightly enlarged with pulmonary vascularity within normal limits. No adenopathy. No evident bone lesions. IMPRESSION: Tube and catheter positions as described without evident pneumothorax. Significant clearing of pulmonary edema bilaterally. Edema with patchy airspace consolidation remains in the right lower lobe. There may be a degree of pneumonia in the right lower lobe. Left lung now clear. Heart slightly enlarged, less prominent than 1  day prior. Electronically Signed   By: Lowella Grip III M.D.   On: 08/14/2016 08:12   Dg Chest Port 1 View  Result Date: 08/08/2016 CLINICAL DATA:  31 year old female status post intubation. EXAM: PORTABLE CHEST 1 VIEW COMPARISON:  Chest x-ray 08/20/2016. FINDINGS: An endotracheal tube is in place with tip 4.6 cm above the carina. There is a right-sided internal jugular central venous catheter with tip terminating in the superior cavoatrial junction. New left internal jugular central venous Cordis through which a Swan-Ganz catheter has been passed into a right descending pulmonary artery branch. Transcutaneous defibrillator pads project over the lower left hemithorax. No pneumothorax. There is cephalization of the pulmonary vasculature, indistinctness of the interstitial markings, and patchy airspace disease throughout the lungs bilaterally suggestive of moderate pulmonary edema. Trace bilateral pleural effusions. Moderate enlargement of the cardiopericardial silhouette could suggest cardiomegaly and/or enlarging pericardial effusion. Upper  mediastinal contours are within normal limits. IMPRESSION: 1. Support apparatus, as above. 2. The appearance the chest suggests worsening congestive heart failure, as detailed above. Electronically Signed   By: Vinnie Langton M.D.   On: 08/16/2016 17:21    ROS: pt intubated  PHYSICAL EXAM: Blood pressure 111/60, pulse (!) 148, temperature 99.1 F (37.3 C), resp. rate (!) 25, height 5\' 6"  (1.676 m), weight 73.8 kg (162 lb 11.2 oz), last menstrual period 08/09/2016, SpO2 98 %. HEENT: PERRL NECK: Rt IJ triple lumen LUNGS:clear ant CARDIAC: Tachycardic regular, no rub ABD:+ BS ND No HSM XNA:TFTD on hands and legs sl tense.  Eccymotic calves and feet.  All fingers with black nailbeds and ischemic finger tips Skin  Hypopigmented upper chest.  Hyperpigmented skin on abdomen NEURO:obtunded  Assessment: 1. ARF, oliguric.  Multiple reasons including hypotension  and IV contrast but given anemia and thrombocytopenia raise ? of TTP 2. 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 Would place HD cath as she will need CVVHD though not urgent for today but suspect will need in AM 2. Ask Hematology to see as they know her well and I am concerned there may be another hematologic problem going on ie TTP 3. Daily labs 4. Start lasix and see if can increase UO but not optimistic 5. Cont with IV steroids for now.  Will check hapto, DAT, LDH, ADAMTS but would like heme to way in on smear   Detra Bores T 08/15/2016, 12:43 PM

## 2016-08-15 NOTE — Plan of Care (Signed)
Problem: Pain Managment: Goal: General experience of comfort will improve Outcome: Progressing Pt does not appear to be in any pain. Pain medication is ordered if needed.   Problem: Bowel/Gastric: Goal: Will not experience complications related to bowel motility Outcome: Not Progressing Pt has had three bowel movements, but there is still a possible ileus.   Problem: Nutritional: Goal: Intake of prescribed amount of daily calories will improve Outcome: Not Progressing Unable to initiate tube feedings due to possible ileus.   Problem: Respiratory: Goal: Ability to maintain a clear airway and adequate ventilation will improve Outcome: Progressing Pt is able to tolerate lower levels of ventilator support.   Problem: Activity: Goal: Capacity to carry out activities will improve Outcome: Not Progressing Pt is unresponsive.

## 2016-08-15 NOTE — Progress Notes (Addendum)
Casey Chapman   DOB:December 25, 1985   MA#:[id number]   FSE#:[id number]  Hematology and Oncology follow up note   Subjective:  Patient is known to me, last seen by me in 11/2015.she had a history of hemolytic anemia, resolved after splenectomy, resolved. She was seen by me for diffuse adenopathy in the past few years, the pleural biopsy was benign, and repeated CT scan (last in 12/2015) showed a stable adenopathy. She was admitted to hospital on 08/25/2016 with sever HTN. Previously wen to Urgent Care on 7/6 for pain in hands and feet. On 7/8 her BP was high and went to the ED. During that visit she went under cardiac arrest for 2 minutes. She was resuscitated for total of 30-40 minutes. CT Head negative for acute process. CTA Chest revealed scattered cystic changes of the lungs and cardiomegaly with a large pericardial effusion which has increased in size compared to last exam. Patient was intubated during cardiac arrest. She is currently remained on mechanical ventilation, and on 2 pressors, unresponsive. I was called to evaluate her worsening anemia and thrombocytopenia. Her hemoglobin was 9.5 and plt 120K on admission, she has required 2 units of blood transfusion, plt dropped to 40K, no clinically significant bleeding.   I spoke with her nurse, her brother and step mother at her bedside.    Objective:  Vitals:   08/15/16 2115 08/15/16 2130  BP: 117/61 (!) 120/59  Pulse: (!) 136 (!) 136  Resp: (!) 30 (!) 29  Temp:      Body mass index is 26.26 kg/m.  Intake/Output Summary (Last 24 hours) at 08/15/16 2147 Last data filed at 08/15/16 2100  Gross per 24 hour  Intake          3299.42 ml  Output              100 ml  Net          3199.42 ml    The patient is intubated, remains to be on the mechanical ventilator, unresponsive  Sclerae unicteric  Oropharynx clear  No peripheral adenopathy  Lungs clear -- no rales or rhonchi  Heart regular rate and rhythm  Abdomen benign  (+) fingertip discoloration,  and ulceration  Neuro nonfocal   CBG (last 3)   Recent Labs  08/15/16 1133 08/15/16 1609 08/15/16 1957  GLUCAP 94 94 98     Labs:  Lab Results  Component Value Date   WBC 34.1 (H) 08/15/2016   HGB 8.7 (L) 08/15/2016   HCT 26.3 (L) 08/15/2016   MCV 84.4 08/15/2016   PLT 48 (L) 08/15/2016   NEUTROABS 24.5 (H) 08/15/2016    CMP Latest Ref Rng & Units 08/15/2016 08/15/2016 08/15/2016  Glucose 65 - 99 mg/dL 91 - 69  BUN 6 - 20 mg/dL 57(H) - 54(H)  Creatinine 0.44 - 1.00 mg/dL 3.76(H) - 3.47(H)  Sodium 135 - 145 mmol/L 131(L) - 135  Potassium 3.5 - 5.1 mmol/L 5.4(H) - 5.0  Chloride 101 - 111 mmol/L 97(L) - 101  CO2 22 - 32 mmol/L 18(L) - 19(L)  Calcium 8.9 - 10.3 mg/dL 6.3(LL) - 7.4(L)  Total Protein 6.5 - 8.1 g/dL - 4.9(L) -  Total Bilirubin 0.3 - 1.2 mg/dL - 3.3(H) -  Alkaline Phos 38 - 126 U/L - 62 -  AST 15 - 41 U/L - 1,155(H) -  ALT 14 - 54 U/L - 668(H) -     Urine Studies No results for input(s): UHGB, CRYS in the last 72 hours.  Invalid  input(s): UACOL, UAPR, USPG, UPH, UTP, UGL, UKET, UBIL, UNIT, UROB, ULEU, UEPI, UWBC, Duwayne Heck, [place], [place]  Basic Metabolic Panel:  Recent Labs Lab 08/26/2016 0631 09/02/2016 0952 08/23/2016 1653  08/07/2016 2006  08/14/16 0358 08/14/16 1134 08/14/16 1533 08/14/16 2213 08/15/16 0250 08/15/16 1447  NA 134* 134* 159*  < > 136  < > 136 135 135 135 135 131*  K 5.1 5.6* 5.4*  < > 3.0*  < > 4.7 4.4 4.6 4.6 5.0 5.4*  CL 99* 100* 92*  < > 95*  < > 97* 96* 98* 98* 101 97*  CO2 15* 13* 40*  < > 20*  < > 20* 20* 20*  --  19* 18*  GLUCOSE 88 131* 133*  < > 183*  < > 118* 118* 128* 103* 69 91  BUN 26* 30* 32*  < > 38*  < > 42* 46* 48* 48* 54* 57*  CREATININE 1.46* 1.77* 2.23*  < > 2.61*  < > 2.95* 3.21* 3.19* 3.30* 3.47* 3.76*  CALCIUM 8.2* 8.0* 8.2*  < > 7.3*  < > 6.8* 6.2* 5.7*  --  7.4* 6.3*  MG 2.6* 2.4 2.3  --  1.9  --   --   --  2.1  --   --   --   PHOS 10.6* 9.8* 11.2*  --   --   --   --   --   --   --   --   --   < > =  values in this interval not displayed. GFR Estimated Creatinine Clearance: 21.9 mL/min (A) (by C-G formula based on SCr of 3.76 mg/dL (H)). Liver Function Tests:  Recent Labs Lab 08/28/2016 1653 08/14/16 0358 08/15/16 1100  AST 732* 1,901* 1,155*  ALT 265* 643* 668*  ALKPHOS 58 63 62  BILITOT 0.6 1.0 3.3*  PROT 3.9* 4.5* 4.9*  ALBUMIN 1.8* 2.0* 2.5*   No results for input(s): LIPASE, AMYLASE in the last 168 hours. No results for input(s): AMMONIA in the last 168 hours. Coagulation profile  Recent Labs Lab 08/16/2016 1653 08/15/16 1040  INR 2.90 2.37    CBC:  Recent Labs Lab 09/01/2016 0631 08/31/2016 0952 08/12/2016 1653 08/14/16 0358 08/14/16 2213 08/15/16 0250 08/15/16 0841  WBC 13.6* 16.5* 21.8* 24.0*  --  34.1*  --   NEUTROABS  --   --  13.5*  --   --  24.5*  --   HGB 8.2* 8.6* 6.3* 9.1* 9.2* 7.0* 8.7*  HCT 26.4* 26.8*  26.9* 20.0* 26.9* 27.0* 20.6* 26.3*  MCV 91.0 90.2 88.9 87.9  --  84.4  --   PLT 64* 40* 34* 48*  --  48*  --    Cardiac Enzymes:  Recent Labs Lab 08/07/2016 0631 08/15/2016 0952 08/17/2016 1653 09/02/2016 1820 08/14/16 0358 08/15/16 1040  CKTOTAL  --   --   --   --   --  5,732*  TROPONINI 7.21* 42.94* >65.00* >65.00* >65.00*  --    BNP: Invalid input(s): POCBNP CBG:  Recent Labs Lab 08/15/16 0453 08/15/16 0754 08/15/16 1133 08/15/16 1609 08/15/16 1957  GLUCAP 84 78 94 94 98   D-Dimer  Recent Labs  08/15/16 1447  DDIMER >20.00*   Hgb A1c No results for input(s): HGBA1C in the last 72 hours. Lipid Profile  Recent Labs  09/03/2016 0631  TRIG 252*   Thyroid function studies  Recent Labs  08/12/2016 0028  TSH 6.435*   Anemia work up  [employer]  08/22/2016 0952 08/15/16 0841  VITAMINB12 591  --   RETICCTPCT  --  2.6   Microbiology Recent Results (from the past 240 hour(s))  MRSA PCR Screening     Status: None   Collection Time: 08/09/2016  7:10 AM  Result Value Ref Range Status   MRSA by PCR NEGATIVE NEGATIVE Final     Comment:        The GeneXpert MRSA Assay (FDA approved for NASAL specimens only), is one component of a comprehensive MRSA colonization surveillance program. It is not intended to diagnose MRSA infection nor to guide or monitor treatment for MRSA infections.   MRSA PCR Screening     Status: Abnormal   Collection Time: 08/12/2016  9:52 AM  Result Value Ref Range Status   MRSA by PCR POSITIVE (A) NEGATIVE Final    Comment:        The GeneXpert MRSA Assay (FDA approved for NASAL specimens only), is one component of a comprehensive MRSA colonization surveillance program. It is not intended to diagnose MRSA infection nor to guide or monitor treatment for MRSA infections. RESULT CALLED TO, READ BACK BY AND VERIFIED WITH: SUGGS,Z @ 1236 ON [id number] BY POTEAT,S DELTA CHECK NOTED       Studies:  Ct Head Wo Contrast  Result Date: 08/14/2016 CLINICAL DATA:  Cardiac arrest yesterday. Multiple seizures. History of malignant hypertension, autoimmune hemolytic anemia. EXAM: CT HEAD WITHOUT CONTRAST TECHNIQUE: Contiguous axial images were obtained from the base of the skull through the vertex without intravenous contrast. COMPARISON:  CT HEAD August 13, 2016 FINDINGS: Limited assessment due to streak artifact from multiple wires and, coli blanket BRAIN: No intraparenchymal hemorrhage, mass effect nor midline shift. Preservation of gray-white matter differentiation. The ventricles and sulci are normal. No acute large vascular territory infarcts. No abnormal extra-axial fluid collections. Basal cisterns are patent. VASCULAR: Unremarkable. SKULL/SOFT TISSUES: No skull fracture. No significant soft tissue swelling. ORBITS/SINUSES: The included ocular globes and orbital contents are normal.Mild paranasal sinus mucosal thickening without air-fluid levels. OTHER: Bilateral punctate parotid sialolith. Life-support lines in place. IMPRESSION: No acute intracranial process ; no CT findings of hypoxic ischemic  encephalopathy. Electronically Signed   By: Elon Alas M.D.   On: 08/14/2016 22:13   Dg Chest Port 1 View  Result Date: 08/15/2016 CLINICAL DATA:  Acute respiratory failure. Autoimmune hemolytic anemia. EXAM: PORTABLE CHEST 1 VIEW COMPARISON:  08/14/2016. FINDINGS: Persistent enlargement of cardiac silhouette. Slight decrease lung volumes. Continued improvement in pulmonary edema. Support tubes and lines are stable. IMPRESSION: Stable cardiac silhouette. Continued improvement in pulmonary edema. Electronically Signed   By: Staci Righter M.D.   On: 08/15/2016 08:44   Dg Chest Port 1 View  Result Date: 08/14/2016 CLINICAL DATA:  Hypoxia EXAM: PORTABLE CHEST 1 VIEW COMPARISON:  August 13, 2016 FINDINGS: Endotracheal tube tip is 4.4 cm above the carina. Nasogastric tube tip and side port are below the diaphragm. Swan-Ganz catheter tip is in the proximal right intralobar pulmonary artery. There is a right jugular catheter with tip in superior vena cava. No pneumothorax. There has been significant resolution of pulmonary edema compared to 1 day prior. There remains diffuse interstitial prominence and patchy consolidation in the right lower lung zone. Left lung is now clear. Heart is slightly enlarged with pulmonary vascularity within normal limits. No adenopathy. No evident bone lesions. IMPRESSION: Tube and catheter positions as described without evident pneumothorax. Significant clearing of pulmonary edema bilaterally. Edema with patchy airspace consolidation remains in the right lower lobe.  There may be a degree of pneumonia in the right lower lobe. Left lung now clear. Heart slightly enlarged, less prominent than 1 day prior. Electronically Signed   By: Lowella Grip III M.D.   On: 08/14/2016 08:12    Assessment: 31 y.o. female with a she has a past medical history of Chlamydia (04/16/12); Eczema; and Hemolytic anemia ([place]) (2012), mild diffuse adenopathy probably benign, presented with severe  hypertension and cardiac arrest.   1. Cardiac arrest and cardiogenic shock 2. Pericardial effusion, and cardiomyopathy  (improved) 3. AKI, oliguria  4. Shock liver  5. History of hemolytic anemia, worsening anemia 6. Worsening thrombocytopenia   Plan:  -I have reviewed her peripheral blood smear, which showed moderate schistocytes, increased denucleated red blood cell and polychromasia, which supports hemolytic anemia. Leukocytosis, with dominant neutrophil and left shift , toxic granulation, and thrombocytopenia.  -I think her anemia and thrombocytopenia are multifactorial, most are likely related to autoimmune process, but cardiac arrest, resuscitation and liver and renal failure also contributes to it. I do not have high suspicion for TTP, although can not rule out completely. ADMTS13 has been ordered, result won't be back until several days later.  -Given the high suspicion of CTD, and likely autoimmune related thrombocytopenia and hemolytic anemia, I agree with increase her solumedro to 1-2g daily, to see if her condition improves -If plasma exchange may potentially help her CTD, it could be considered. However, given pt is still on pressor, low possibility of TTP, I do not recommend to initiate plasma exchange tonight or tomorrow.  -will order coombs test and ret count, monitor LDH  -I have discussed with her nephrologist Dr. Mercy Moore about the above. I spoke with Dr. Waunita Schooner this afternoon also.  -I will follow up.   This document serves as a record of services personally performed by Truitt Merle, MD. It was created on her behalf by Joslyn Devon, a trained medical scribe. The creation of this record is based on the scribe's personal observations and the provider's statements to them. This document has been checked and approved by the attending provider.   Truitt Merle, MD 08/15/2016  9:47 PM

## 2016-08-15 NOTE — Progress Notes (Signed)
STAT LTM started; all electrodes under 5 kohms. Educated nurse on the event button.

## 2016-08-15 NOTE — Progress Notes (Signed)
CRITICAL VALUE ALERT  Critical Value:  Lactic 3.5  Date & Time Notied:  7/11 0847  Provider Notified: Lake Bells  Orders Received/Actions taken: lactic improved, no new orders

## 2016-08-15 NOTE — Progress Notes (Signed)
  Amiodarone Drug - Drug Interaction Consult Note  Recommendations: No medication changes needed  Amiodarone is metabolized by the cytochrome P450 system and therefore has the potential to cause many drug interactions. Amiodarone has an average plasma half-life of 50 days (range 20 to 100 days).   There is potential for drug interactions to occur several weeks or months after stopping treatment and the onset of drug interactions may be slow after initiating amiodarone.   []  Statins: Increased risk of myopathy. Simvastatin- restrict dose to 20mg  daily. Other statins: counsel patients to report any muscle pain or weakness immediately.  []  Anticoagulants: Amiodarone can increase anticoagulant effect. Consider warfarin dose reduction. Patients should be monitored closely and the dose of anticoagulant altered accordingly, remembering that amiodarone levels take several weeks to stabilize.  []  Antiepileptics: Amiodarone can increase plasma concentration of phenytoin, the dose should be reduced. Note that small changes in phenytoin dose can result in large changes in levels. Monitor patient and counsel on signs of toxicity.  Current treatment Keppra - no DI  []  Beta blockers: increased risk of bradycardia, AV block and myocardial depression. Sotalol - avoid concomitant use.  []   Calcium channel blockers (diltiazem and verapamil): increased risk of bradycardia, AV block and myocardial depression.  []   Cyclosporine: Amiodarone increases levels of cyclosporine. Reduced dose of cyclosporine is recommended.  []  Digoxin dose should be halved when amiodarone is started.  []  Diuretics: increased risk of cardiotoxicity if hypokalemia occurs.  []  Oral hypoglycemic agents (glyburide, glipizide, glimepiride): increased risk of hypoglycemia. Patient's glucose levels should be monitored closely when initiating amiodarone therapy.   []  Drugs that prolong the QT interval:  Torsades de pointes risk may be  increased with concurrent use - avoid if possible.  Monitor QTc, also keep magnesium/potassium WNL if concurrent therapy can't be avoided. Marland Kitchen Antibiotics: e.g. fluoroquinolones, erythromycin. . Antiarrhythmics: e.g. quinidine, procainamide, disopyramide, sotalol. . Antipsychotics: e.g. phenothiazines, haloperidol.  . Lithium, tricyclic antidepressants, and methadone. Thank You,   [name] Pharm.D. CPP, BCPS Clinical Pharmacist [phone] 08/15/2016 7:32 PM

## 2016-08-15 NOTE — Procedures (Signed)
LTM-EEG Report  HISTORY: Continuous video-EEG monitoring performed for 31year old with cardiac arrest, altered mental status, and jerking movements. ACQUISITION: International 10-20 system for electrode placement; 18 channels with additional eyes linked to ipsilateral ears and EKG. Additional T1-T2 electrodes were used. Continuous video recording obtained.   EEG NUMBER MEDICATIONS:  Day 1: LEV, fentanyl  DAY #1: from 2347 7/10/18to 1150 08/15/16 BACKGROUND: An overall medium voltage mostly continuous recording with poorspontaneous variability and reactivity. The background consisted of medium voltage theta-delta activity bilaterally during rest with atypical arousals and increased delta range activity with stimulation. Reactivity was present. There was no evidence of a posterior basic rhythm or sleep architecture however. Occasional brief periods of voltage attenuation were seen, likely a medication effect.   EPILEPTIFORM/PERIODIC ACTIVITY: none SEIZURES:none EVENTS:The event was pressed twice at 0710 and 0740 for unclear reasons. There was no change to the patient's EEG background with these events.  EKG: no significant arrhythmia  SUMMARY: This was a markedly abnormal continuous video EEG due to diffuse slow activity and loss of normal physiologic patterns. Reactivity was present, however spontaneous variability was poor. This record was consistent with a diffuse cerebral disturbance.

## 2016-08-15 NOTE — Progress Notes (Signed)
ABG read to [name], NP orders for rate to be changed from 24 to 16 on the vent and recheck a ABG in an hour.  Rowe Pavy, RN

## 2016-08-15 NOTE — Procedures (Signed)
Arterial Catheter Insertion Procedure Note Casey Chapman [id number] April 21, 1985  Procedure: Insertion of Arterial Catheter  Indications: Blood pressure monitoring  Procedure Details Consent: Risks of procedure as well as the alternatives and risks of each were explained to the (patient/caregiver).  Consent for procedure obtained. Time Out: Verified patient identification, verified procedure, site/side was marked, verified correct patient position, special equipment/implants available, medications/allergies/relevent history reviewed, required imaging and test results available.  Performed  Maximum sterile technique was used including antiseptics, cap, gloves, gown, hand hygiene, mask and sheet. Skin prep: Chlorhexidine; local anesthetic administered 20 gauge catheter was inserted into right femoral artery using the Seldinger technique.  Evaluation Blood flow good; BP tracing good. Complications: No apparent complications.   Casey Chapman, AGACNP-BC [facility] Pulmonology/Critical Care Pager [phone] or [phone]  08/15/2016 6:49 PM

## 2016-08-15 NOTE — Progress Notes (Signed)
Pharmacy Antibiotic Note Casey Chapman is a 31 y.o. female admitted on 08/10/2016 s/p cardiac arrest several lines placed during code. Tmax 101.1 now afebrile, wbc 24>34.1 - Random vancomycin level tonight is 35 mcg/ml, supratherapeutic. No need to redose  Scr 3.47 >3.76.  Nephrology plans to place HD cath as anticipating she will need CVVHD tomorrow.   Plan: No vancomycin needed tonight.  Continue cefepime 1g IV q24h-  Adjust dose if CVVHD started Monitor creatinine and redose Vancomycin as appropriate F/u if starts CVVHD in AM and recheck vancomycin random & redose vancomycin.   Height: 5\' 6"  (167.6 cm) Weight: 162 lb 11.2 oz (73.8 kg) IBW/kg (Calculated) : 59.3  Temp (24hrs), Avg:99.3 F (37.4 C), Min:97.9 F (36.6 C), Max:99.7 F (37.6 C)   Recent Labs Lab 08/18/2016 0631  08/31/2016 0952 08/09/2016 1000 08/05/2016 1653  08/06/2016 2011  08/14/16 0358 08/14/16 1134 08/14/16 1533 08/14/16 1757 08/14/16 2213 08/15/16 0250 08/15/16 0847 08/15/16 1109 08/15/16 1447 08/15/16 2115  WBC 13.6*  --  16.5*  --  21.8*  --   --   --  24.0*  --   --   --   --  34.1*  --   --   --   --   CREATININE 1.46*  --  1.77*  --  2.23*  < >  --   < > 2.95* 3.21* 3.19*  --  3.30* 3.47*  --   --  3.76*  --   LATICACIDVEN 11.5*  < >  --  9.0* 17.1*  --  12.5*  --   --   --   --   --   --   --  3.5* 3.4*  --   --   VANCORANDOM  --   --   --   --   --   --   --   --   --   --   --  19  --   --   --   --   --  35  < > = values in this interval not displayed.  Estimated Creatinine Clearance: 21.9 mL/min (A) (by C-G formula based on SCr of 3.76 mg/dL (H)).    No Known Allergies  Antimicrobials this admission: Vancomycin 7/9 >>  Cefepime  7/9 >>   Dose adjustments this admission: 7/10: VR 19. Ke 0.0324 (t1/2 21 hours). Est trough (24 hours from last dose) = 17.5. 750 q24h provides est peak of 32 and trough of 15.  Microbiology results: 7/9 MRSA PCR: Negative  Thank you for allowing pharmacy to be a  part of this patient's care.  Dierdre Harness, PharmD Clinical Pharmacist [phone] (Pager) 08/15/2016 10:15 PM

## 2016-08-16 ENCOUNTER — Inpatient Hospital Stay (HOSPITAL_COMMUNITY): Payer: 59

## 2016-08-16 DIAGNOSIS — N179 Acute kidney failure, unspecified: Secondary | ICD-10-CM

## 2016-08-16 DIAGNOSIS — G931 Anoxic brain damage, not elsewhere classified: Secondary | ICD-10-CM

## 2016-08-16 DIAGNOSIS — R570 Cardiogenic shock: Secondary | ICD-10-CM

## 2016-08-16 LAB — GLUCOSE, CAPILLARY
GLUCOSE-CAPILLARY: 92 mg/dL (ref 65–99)
GLUCOSE-CAPILLARY: 110 mg/dL — AB (ref 65–99)
Glucose-Capillary: 102 mg/dL — ABNORMAL HIGH (ref 65–99)
Glucose-Capillary: 105 mg/dL — ABNORMAL HIGH (ref 65–99)

## 2016-08-16 LAB — RENAL FUNCTION PANEL
Albumin: 2.3 g/dL — ABNORMAL LOW (ref 3.5–5.0)
Anion gap: 15 (ref 5–15)
BUN: 69 mg/dL — ABNORMAL HIGH (ref 6–20)
CHLORIDE: 97 mmol/L — AB (ref 101–111)
CO2: 18 mmol/L — ABNORMAL LOW (ref 22–32)
Calcium: 5.5 mg/dL — CL (ref 8.9–10.3)
Creatinine, Ser: 4.28 mg/dL — ABNORMAL HIGH (ref 0.44–1.00)
GFR calc Af Amer: 15 mL/min — ABNORMAL LOW (ref >60)
GFR calc non Af Amer: 13 mL/min — ABNORMAL LOW (ref >60)
Glucose, Bld: 108 mg/dL — ABNORMAL HIGH (ref 65–99)
PHOSPHORUS: 6 mg/dL — AB (ref 2.5–4.6)
POTASSIUM: 5.5 mmol/L — AB (ref 3.5–5.1)
SODIUM: 130 mmol/L — AB (ref 135–145)

## 2016-08-16 LAB — RETICULOCYTES
RBC.: 2.69 MIL/uL — ABNORMAL LOW (ref 3.87–5.11)
Retic Count, Absolute: 75.3 10*3/uL (ref 19.0–186.0)
Retic Ct Pct: 2.8 % (ref 0.4–3.1)

## 2016-08-16 LAB — BLOOD GAS, ARTERIAL
ACID-BASE DEFICIT: 4.9 mmol/L — AB (ref 0.0–2.0)
Bicarbonate: 18.8 mmol/L — ABNORMAL LOW (ref 20.0–28.0)
DRAWN BY: 290171
FIO2: 40
LHR: 16 {breaths}/min
O2 SAT: 98.7 %
PEEP: 5 cmH2O
PH ART: 7.416 (ref 7.350–7.450)
Patient temperature: 98.6
VT: 470 mL
pCO2 arterial: 29.9 mmHg — ABNORMAL LOW (ref 32.0–48.0)
pO2, Arterial: 129 mmHg — ABNORMAL HIGH (ref 83.0–108.0)

## 2016-08-16 LAB — ALDOLASE: Aldolase: 95.5 U/L — ABNORMAL HIGH (ref 3.3–10.3)

## 2016-08-16 LAB — COOXEMETRY PANEL
CARBOXYHEMOGLOBIN: 1.9 % — AB (ref 0.5–1.5)
Methemoglobin: 1.3 % (ref 0.0–1.5)
O2 SAT: 86 %
TOTAL HEMOGLOBIN: 7.8 g/dL — AB (ref 12.0–16.0)

## 2016-08-16 LAB — CBC
HCT: 22.5 % — ABNORMAL LOW (ref 36.0–46.0)
HCT: 17.1 % — ABNORMAL LOW (ref 36.0–46.0)
Hemoglobin: 7.8 g/dL — ABNORMAL LOW (ref 12.0–15.0)
Hemoglobin: 6 g/dL — CL (ref 12.0–15.0)
MCH: 29 pg (ref 26.0–34.0)
MCH: 28.8 pg (ref 26.0–34.0)
MCHC: 34.7 g/dL (ref 30.0–36.0)
MCHC: 35.1 g/dL (ref 30.0–36.0)
MCV: 83.6 fL (ref 78.0–100.0)
MCV: 82.2 fL (ref 78.0–100.0)
PLATELETS: 46 10*3/uL — AB (ref 150–400)
PLATELETS: 68 10*3/uL — AB (ref 150–400)
RBC: 2.69 MIL/uL — AB (ref 3.87–5.11)
RBC: 2.08 MIL/uL — AB (ref 3.87–5.11)
RDW: 18.4 % — AB (ref 11.5–15.5)
RDW: 18.7 % — ABNORMAL HIGH (ref 11.5–15.5)
WBC: 41.3 10*3/uL — AB (ref 4.0–10.5)
WBC: 29.9 10*3/uL — ABNORMAL HIGH (ref 4.0–10.5)

## 2016-08-16 LAB — BASIC METABOLIC PANEL
ANION GAP: 15 (ref 5–15)
BUN: 65 mg/dL — ABNORMAL HIGH (ref 6–20)
CALCIUM: 5.7 mg/dL — AB (ref 8.9–10.3)
CO2: 18 mmol/L — ABNORMAL LOW (ref 22–32)
Chloride: 98 mmol/L — ABNORMAL LOW (ref 101–111)
Creatinine, Ser: 4.09 mg/dL — ABNORMAL HIGH (ref 0.44–1.00)
GFR, EST AFRICAN AMERICAN: 15 mL/min — AB (ref >60)
GFR, EST NON AFRICAN AMERICAN: 13 mL/min — AB (ref >60)
Glucose, Bld: 100 mg/dL — ABNORMAL HIGH (ref 65–99)
Potassium: 5.6 mmol/L — ABNORMAL HIGH (ref 3.5–5.1)
SODIUM: 131 mmol/L — AB (ref 135–145)

## 2016-08-16 LAB — HAPTOGLOBIN

## 2016-08-16 LAB — PREPARE RBC (CROSSMATCH)

## 2016-08-16 LAB — SAVE SMEAR

## 2016-08-16 LAB — OCCULT BLOOD X 1 CARD TO LAB, STOOL: Fecal Occult Bld: POSITIVE — AB

## 2016-08-16 LAB — CALCIUM, IONIZED: CALCIUM, IONIZED, SERUM: 3.1 mg/dL — AB (ref 4.5–5.6)

## 2016-08-16 MED ORDER — SODIUM CHLORIDE 0.9 % IV SOLN
1.0000 g | Freq: Once | INTRAVENOUS | Status: AC
  Administered 2016-08-16: 1 g via INTRAVENOUS
  Filled 2016-08-16: qty 10

## 2016-08-16 MED ORDER — PRISMASOL BGK 0/2.5 32-2.5 MEQ/L IV SOLN
INTRAVENOUS | Status: DC
  Administered 2016-08-16 – 2016-08-17 (×4): via INTRAVENOUS_CENTRAL
  Filled 2016-08-16 (×7): qty 5000

## 2016-08-16 MED ORDER — HEPARIN SODIUM (PORCINE) 1000 UNIT/ML DIALYSIS
1000.0000 [IU] | INTRAMUSCULAR | Status: DC | PRN
  Administered 2016-08-18 – 2016-08-19 (×2): 2800 [IU] via INTRAVENOUS_CENTRAL
  Filled 2016-08-16 (×2): qty 4
  Filled 2016-08-16 (×3): qty 6

## 2016-08-16 MED ORDER — DEXTROSE 5 % IV SOLN
2.0000 g | Freq: Two times a day (BID) | INTRAVENOUS | Status: DC
  Administered 2016-08-16 – 2016-08-19 (×6): 2 g via INTRAVENOUS
  Filled 2016-08-16 (×6): qty 2

## 2016-08-16 MED ORDER — PRISMASOL BGK 4/2.5 32-4-2.5 MEQ/L IV SOLN
INTRAVENOUS | Status: DC
  Administered 2016-08-16 – 2016-08-19 (×13): via INTRAVENOUS_CENTRAL
  Filled 2016-08-16 (×17): qty 5000

## 2016-08-16 MED ORDER — PRISMASOL BGK 0/2.5 32-2.5 MEQ/L IV SOLN
INTRAVENOUS | Status: DC
  Administered 2016-08-16 – 2016-08-17 (×2): via INTRAVENOUS_CENTRAL
  Filled 2016-08-16 (×3): qty 5000

## 2016-08-16 MED ORDER — SODIUM CHLORIDE 0.9 % IV SOLN
Freq: Once | INTRAVENOUS | Status: AC
  Administered 2016-08-16: 12:00:00 via INTRAVENOUS

## 2016-08-16 MED ORDER — SODIUM CHLORIDE 0.9 % FOR CRRT: INTRAVENOUS_CENTRAL | Status: DC | PRN

## 2016-08-16 MED ORDER — SODIUM CHLORIDE 0.9 % IV SOLN: Freq: Once | INTRAVENOUS | Status: DC

## 2016-08-16 NOTE — Procedures (Signed)
LTM-EEG Report  HISTORY: Continuous video-EEG monitoring performed for 31year old with cardiac arrest, altered mental status, and jerking movements. ACQUISITION: International 10-20 system for electrode placement; 18 channels with additional eyes linked to ipsilateral ears and EKG. Additional T1-T2 electrodes were used. Continuous video recording obtained.   EEG NUMBER MEDICATIONS:  Day 1: LEV, fentanyl Day 2: LEV, fentanyl  DAY #1: from 2347 7/10/18to 1150 08/15/16 BACKGROUND: An overall medium voltage mostly continuous recording with poorspontaneous variability and reactivity. The background consisted of medium voltage theta-delta activity bilaterally during rest with atypical arousals and increased delta range activity with stimulation. Reactivity was present. There was no evidence of a posterior basic rhythm or sleep architecture however. Occasional brief periods of voltage attenuation were seen, likely a medication effect.   EPILEPTIFORM/PERIODIC ACTIVITY: none SEIZURES:none EVENTS:The event was pressed twice at 0710 and 0740 for unclear reasons. There was no change to the patient's EEG background with these events.  DAY #2: from 1150 7/11/18to 0730 08/16/16 BACKGROUND: An overall medium voltage mostly continuous recording with poorspontaneous variability and reactivity. The background consisted of medium voltage theta-delta activity bilaterally during rest with atypical arousals and increased generalized rhythmic delta activity (GRDA, stimulus-induced) with stimulation. Reactivity was present. There was no evidence of a posterior basic rhythm or sleep architecture however.  EPILEPTIFORM/PERIODIC ACTIVITY: none SEIZURES:none EVENTS:The event was pressed at 0608 for unclear reasons after stimulation. The patient had an atypical arousal pattern with increased generalized delta activity but no clear epileptiform activity.  EKG: no significant arrhythmia  SUMMARY: This was a  markedly abnormal continuous video EEG due to diffuse slow activity and loss of normal physiologic patterns. Reactivity was present, however spontaneous variability was poor. This record was consistent with a diffuse cerebral disturbance/encephalopathy patterns. Clinical events noted without clear electrographic correlate.

## 2016-08-16 NOTE — Progress Notes (Signed)
PULMONARY / CRITICAL CARE MEDICINE   Name: Casey Chapman MRN: [id number] DOB: 1985-07-13    ADMISSION DATE:  08/25/2016 CONSULTATION DATE:  09/01/2016  REFERRING MD:  Dr. Randal Buba   CHIEF COMPLAINT:  HTN   BRIEF SUMMARY:   31 year old female with PMH of autoimmune hemolytic anemia s/p splenectomy, being followed by Dr. Burr Medico in Hematology. Has reported 60 lb weight loss in past 2 years with history of weakness and swelling in lower extremity.  Presented with pain in hands, feet, marked hypertension to the [facility] ER on 7/9.  Family noted development of a rash around chest/neck over the last year and progressive hypertension.  In the [facility] ER she was very hypertensive which was treated with labetalol, not long afterwards she had a cardiac arrest.  She was moved to [facility] later in the day with severe cardiogenic shock, unexplained airspace disease and recurrent episodes of cardiac arrest.  On 7/9 she had at least 4 episodes of cardiac arrest requiring CPR for approximately 30-45 minutes in total.  Echo on 7/9 showed a moderate pericardial effusion, LVEF 20-25%, RV function severely reduced.   SUBJECTIVE:   Febrile yesterday with concerns for sepsis. Empiric ABX. Seizure activity noted yesterday afternoon. Neuro consult and EEG started. CT head done and normal.   VITAL SIGNS: BP (!) 115/57   Pulse (!) 122   Temp 98.9 F (37.2 C) (Core (Comment))   Resp (!) 26   Ht _0  (1.676 m)   Wt 75.7 kg (166 lb 14.2 oz)   LMP 08/09/2016 (Exact Date) Comment: negative beta HCG 09/03/2016  SpO2 99%   BMI 26.94 kg/m   HEMODYNAMICS: CVP:  [1 mmHg-15 mmHg] 4 mmHg  VENTILATOR SETTINGS: Vent Mode: PRVC FiO2 (%):  [40 %] 40 % Set Rate:  [16 bmp-24 bmp] 16 bmp Vt Set:  [470 mL] 470 mL PEEP:  [5 cmH20-8 cmH20] 5 cmH20 Plateau Pressure:  [17 cmH20-21 cmH20] 18 cmH20  INTAKE / OUTPUT: I/O last 3 completed shifts: In: 4604.9 [I.V.:3582.9; Blood:330; IV Piggyback:692] Out: 100 [Emesis/NG output:100]  PHYSICAL  EXAMINATION: General:  Young AA female, in bed, on vent, completely unresponsive. HENT: /AT, pupillary reflexes intact, respiratory drive present PULM: Coarse BS diffusely CV: RRR, no MRG GI: BS+, soft, nontender MSK: normal bulk and tone Neuro: Comatose on vent. Dolls eye neg. Gag intact.   LABS:  BMET  Recent Labs Lab 08/15/16 0250 08/15/16 1447 08/16/16 0303  NA 135 131* 131*  K 5.0 5.4* 5.6*  CL 101 97* 98*  CO2 19* 18* 18*  BUN 54* 57* 65*  CREATININE 3.47* 3.76* 4.09*  GLUCOSE 69 91 100*    Electrolytes  Recent Labs Lab 08/16/2016 0631 08/30/2016 0952 08/15/2016 1653  08/30/2016 2006  08/14/16 1533 08/15/16 0250 08/15/16 1447 08/16/16 0303  CALCIUM 8.2* 8.0* 8.2*  < > 7.3*  < > 5.7* 7.4* 6.3* 5.7*  MG 2.6* 2.4 2.3  --  1.9  --  2.1  --   --   --   PHOS 10.6* 9.8* 11.2*  --   --   --   --   --   --   --   < > = values in this interval not displayed.  CBC  Recent Labs Lab 08/14/16 0358  08/15/16 0250 08/15/16 0841 08/16/16 0303  WBC 24.0*  --  34.1*  --  41.3*  HGB 9.1*  < > 7.0* 8.7* 7.8*  HCT 26.9*  < > 20.6* 26.3* 22.5*  PLT 48*  --  48*  --  46*  < > = values in this interval not displayed.  Coag's  Recent Labs Lab 08/21/2016 1653 08/15/16 1040  INR 2.90 2.37    Sepsis Markers  Recent Labs Lab 08/12/2016 0631  08/25/2016 2011 08/14/16 1134 08/15/16 0250 08/15/16 0847 08/15/16 1109  LATICACIDVEN 11.5*  < > 12.5*  --   --  3.5* 3.4*  PROCALCITON 3.08  --   --  >150.00 >150.00  --   --   < > = values in this interval not displayed.  ABG  Recent Labs Lab 08/15/16 1634 08/15/16 1802 08/16/16 0323  PHART 7.500* 7.486* 7.416  PCO2ART 23.6* 26.5* 29.9*  PO2ART 90.0 114.0* 129*    Liver Enzymes  Recent Labs Lab 08/27/2016 1653 08/14/16 0358 08/15/16 1100  AST 732* 1,901* 1,155*  ALT 265* 643* 668*  ALKPHOS 58 63 62  BILITOT 0.6 1.0 3.3*  ALBUMIN 1.8* 2.0* 2.5*    Cardiac Enzymes  Recent Labs Lab 08/24/2016 1653  08/12/2016 1820 08/14/16 0358  TROPONINI >65.00* >65.00* >65.00*    Glucose  Recent Labs Lab 08/15/16 1133 08/15/16 1609 08/15/16 1957 08/15/16 2313 08/16/16 0304 08/16/16 0748  GLUCAP 94 94 98 122* 102* 92    Imaging No results found.   STUDIES:  CTA Chest 05/02/16 >> Cardiomegaly with trace pericardial effusion for age. There is mild diffuse anasarca. Nonspecific retroclavicular, subpectoral, and bilateral axillary lymphadenopathy. Findings have been chronic unstable back to 2016 CT. No acute pulmonary embolus. Small hiatal hernia with fluid in the distal esophagus likely related to reflux.Scattered pulmonary blebs and bulla. No pneumonic consolidation, dominant mass, effusion or pneumothorax. CXR 7/9 >> Endotracheal tube tip approximately 2.3 cm superior to the carina. Moderate to marked cardiomegaly with globular cardiac configuration, consider pericardial effusion Development of hazy bilateral pulmonary opacity suspicious for vascular congestion and pulmonary edema CT Head 7/9 >> No acute process CTA Chest / ABD 7/9 >> stable cardiomegaly, large pericardial effusion (increased since 04/2016), no evidenceof aortic dissection or aneurysm, no PE.  Vascular congestion and pulmonary edema, scattered cystic changes of the lung, heterogeneous enhancement of the kidneys concerning for pyelonephritis, diffusely dilated small bowel concerning for ileus, diffuse subcutaneous edema / anasarca UDS 7/9 >> positive THC Echo 7/9 > LVEF 20-25%, mild LVH, mild LAE, moderate pericardial effusion CT head 7/10 > nonacute  CULTURES: UA 7/9 >>  resp culture 7/9 >>  ANTIBIOTICS: Rocephin 7/9 >> 7/9 Cefepime 7/9 >> Vanc 7/9 >>  SIGNIFICANT EVENTS: 7/08  Presents to ED with HTN > arrested in ER 7/9 Cardiac arrest x4, moved to [facility] severe cardiomyopathy/acidosis  LINES/TUBES: ETT 7/9 >>  R IJ TLC 7/9 >>   AUTOIMMUNE 7/9  C3 >> normal C4 >> normal GBM >>> SSA - greater than 8 SSB >  5.1 DSDNA >>  1 Anti-scleroderma >> < 0.2 ANCA >> <3.5 ACE >> 33 RA >> normal CRP >> 2.6 ESR >> 20 Phosphatidylserine antibodies >>normal  DISCUSSION: 31 y/o female with underlying autoimmune hemolytic anemia presented on 7/9 with extremity pain and severe hypertension; when treated with labetalol developed cardiac arrest.  Noted to have a severe cardiomyopathy on exam, active urinary sediment.  Family notes severe pulmonary hypertension for weeks and an evolving rash over neck/chest.  There is concern for an underlying connective tissue disease.  Unifying diagnosis at this point is not certain.   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 Cardiogenic shock > 7/11 cardiac index 4.7, CVP 9, Wedge 5 Pericardial effusion  P:  Tele MAP goal 70 mmHg Norepi and milrinone drips, epi is off Continue milrinone Heart failure following Continue solumedrol Follow up autoimmune labs, doubt helpful at this stage  RENAL A:   AKI> anuric Metabolic acidosis> lactic, renal failure Renal enhancement on CT angiogram> possibly consistent with medical renal disease like glomerulonephritis Hypokalemia Hypocalcemia Concern for glomerulonephritis > active urinary sediment on U/A P:   Renal dose meds Replace electrolytes as needed Place HD catheter today Repeat BMP, ABG this PM and in AM CRRT once catheter is placed  GASTROINTESTINAL A:   Weight loss in last year Ileus on CT P:   Holding tube feed with suspected ileus.  Continue PPI > via tube Need to consider TPN but given neuro status will speak with family first when available.  HEMATOLOGIC A:    Hemolytic Anemia s/p Splenectomy  -CT A/P in 2015 showed adenopathy E inguinal, external iliac -CT CAP in 2016 showed axilla, supraclavicular and subpectoral adenopathy > progressive since 2011 -Left Axially Lymph node biopsy in 2016 > 2 Benign lymph nodes, no malignancy  P:  Monitor for bleeding CBC daily Transfusion for Hgb <7gm/dL 2 units FFP and one unit platelet transfusion today for HD catheter placement  INFECTIOUS A:   Pyelonephritis? Doubt Aspiration pneumonia? doubt P:   Add blood cultures Empirically cover with broad antibiotics for now  ENDOCRINE A:   Hyperglycemia  P:   SSI  NEUROLOGIC A:   Acute encephalopathy post arrest> high likelihood of anoxic injury Seizures THC use P:   RASS goal -1 to -2 PRN sedation Keppra once, further dosing if indicated per neurology Ativan PRN seizure Continuous EEG ongoing. CT of the head today  FAMILY  - Updates: No family bedside, need to discuss plan of care.  - Inter-disciplinary family meet or Palliative Care meeting due by:  08/20/2016  The patient is critically ill with multiple organ systems failure and requires high complexity decision making for assessment and support, frequent evaluation and titration of therapies, application of advanced monitoring technologies and extensive interpretation of multiple databases.   Critical Care Time devoted to patient care services described in this note is  35  Minutes. This time reflects time of care of this signee Dr Jennet Maduro. This critical care time does not reflect procedure time, or teaching time or supervisory time of PA/NP/Med student/Med Resident etc but could involve care discussion time.  Rush Farmer, M.D. [place] Pulmonary/Critical Care Medicine. Pager: [phone]. After hours pager: [phone].  08/16/2016 10:16 AM

## 2016-08-16 NOTE — Progress Notes (Signed)
CRITICAL VALUE ALERT  Critical Value:  Calcium 5.7  Date & Time Notied:  08/16/2016 0445  Provider Notified: Alva Garnet, MD  Orders Received/Actions taken: Calcium Gluconate ordered by MD

## 2016-08-16 NOTE — Progress Notes (Signed)
S:intubated O:BP 124/62   Pulse (!) 124   Temp 99.1 F (37.3 C) (Rectal)   Resp (!) 28   Ht '5\' 6"'$  (1.676 m)   Wt 75.7 kg (166 lb 14.2 oz)   LMP 08/09/2016 (Exact Date) Comment: negative beta HCG 08/10/2016  SpO2 100%   BMI 26.94 kg/m   Intake/Output Summary (Last 24 hours) at 08/16/16 0748 Last data filed at 08/16/16 0700  Gross per 24 hour  Intake          2532.03 ml  Output                0 ml  Net          2532.03 ml   Weight change: 1.9 kg (4 lb 3 oz) QMG:[id number] TOI:ZTIWP, reg Resp:clear ant Abd:+ BS soft, ND Ext: tr edema.  Ischemic fingers both hands NEURO:obtunded   . chlorhexidine gluconate (MEDLINE KIT)  15 mL Mouth Rinse BID  . Chlorhexidine Gluconate Cloth  6 each Topical Daily  . heparin  5,000 Units Subcutaneous Q8H  . insulin aspart  0-20 Units Subcutaneous Q4H  . mouth rinse  15 mL Mouth Rinse Q2H  . methylPREDNISolone (SOLU-MEDROL) injection  125 mg Intravenous Q6H  . mupirocin ointment  1 application Nasal BID  . pantoprazole (PROTONIX) IV  40 mg Intravenous Q24H  . sodium chloride flush  10-40 mL Intracatheter Q12H   Ct Head Wo Contrast  Result Date: 08/14/2016 CLINICAL DATA:  Cardiac arrest yesterday. Multiple seizures. History of malignant hypertension, autoimmune hemolytic anemia. EXAM: CT HEAD WITHOUT CONTRAST TECHNIQUE: Contiguous axial images were obtained from the base of the skull through the vertex without intravenous contrast. COMPARISON:  CT HEAD August 13, 2016 FINDINGS: Limited assessment due to streak artifact from multiple wires and, coli blanket BRAIN: No intraparenchymal hemorrhage, mass effect nor midline shift. Preservation of gray-white matter differentiation. The ventricles and sulci are normal. No acute large vascular territory infarcts. No abnormal extra-axial fluid collections. Basal cisterns are patent. VASCULAR: Unremarkable. SKULL/SOFT TISSUES: No skull fracture. No significant soft tissue swelling. ORBITS/SINUSES: The included ocular  globes and orbital contents are normal.Mild paranasal sinus mucosal thickening without air-fluid levels. OTHER: Bilateral punctate parotid sialolith. Life-support lines in place. IMPRESSION: No acute intracranial process ; no CT findings of hypoxic ischemic encephalopathy. Electronically Signed   By: Elon Alas M.D.   On: 08/14/2016 22:13   Dg Chest Port 1 View  Result Date: 08/15/2016 CLINICAL DATA:  Acute respiratory failure. Autoimmune hemolytic anemia. EXAM: PORTABLE CHEST 1 VIEW COMPARISON:  08/14/2016. FINDINGS: Persistent enlargement of cardiac silhouette. Slight decrease lung volumes. Continued improvement in pulmonary edema. Support tubes and lines are stable. IMPRESSION: Stable cardiac silhouette. Continued improvement in pulmonary edema. Electronically Signed   By: Staci Righter M.D.   On: 08/15/2016 08:44   BMET    Component Value Date/Time   NA 131 (L) 08/16/2016 0303   NA 139 11/25/2015 1402   K 5.6 (H) 08/16/2016 0303   K 4.0 11/25/2015 1402   CL 98 (L) 08/16/2016 0303   CL 99 10/25/2009 1358   CO2 18 (L) 08/16/2016 0303   CO2 23 11/25/2015 1402   GLUCOSE 100 (H) 08/16/2016 0303   GLUCOSE 101 11/25/2015 1402   GLUCOSE 96 10/25/2009 1358   BUN 65 (H) 08/16/2016 0303   BUN 11.3 11/25/2015 1402   CREATININE 4.09 (H) 08/16/2016 0303   CREATININE 0.6 11/25/2015 1402   CALCIUM 5.7 (LL) 08/16/2016 0303   CALCIUM 9.9 11/25/2015 1402  GFRNONAA 13 (L) 08/16/2016 0303   GFRAA 15 (L) 08/16/2016 0303   CBC    Component Value Date/Time   WBC 41.3 (H) 08/16/2016 0303   RBC 2.69 (L) 08/16/2016 0303   RBC 2.69 (L) 08/16/2016 0303   HGB 7.8 (L) 08/16/2016 0303   HGB 12.2 11/25/2015 1401   HCT 22.5 (L) 08/16/2016 0303   HCT 26.9 (L) 08/10/2016 0952   HCT 38.2 11/25/2015 1401   PLT 46 (L) 08/16/2016 0303   PLT 206 11/25/2015 1401   MCV 83.6 08/16/2016 0303   MCV 90.7 11/25/2015 1401   MCH 29.0 08/16/2016 0303   MCHC 34.7 08/16/2016 0303   RDW 18.4 (H) 08/16/2016 0303    RDW 16.3 (H) 11/25/2015 1401   LYMPHSABS 5.5 (H) 08/15/2016 0250   LYMPHSABS 4.1 (H) 11/25/2015 1401   MONOABS 4.1 (H) 08/15/2016 0250   MONOABS 1.4 (H) 11/25/2015 1401   EOSABS 0.0 08/15/2016 0250   EOSABS 0.1 11/25/2015 1401   EOSABS 0.1 10/25/2009 1358   BASOSABS 0.0 08/15/2016 0250   BASOSABS 0.0 11/25/2015 1401     Assessment:  1. ARF, oliguric secondary to hypotension and IV contrast.  Hematology has low suspicion for TTP 2.  SP cardiac arrest 3. Mild hyperkalemia 4. Hypocalcemia, received IV calcium this AM   Plan: 1. Will start CVVHD after catheter placed.  CVP 4 and on pressors so will keep even for now 2. DC foley 3. DC lasix 4. Prognosis guarded   Masiah Woody T

## 2016-08-16 NOTE — Procedures (Signed)
Central Venous heomdialysis Catheter Insertion Procedure Note Waldine Zenz [id number] 31-Jul-1985  Procedure: Insertion of Central Venous Catheter Indications: CRRT  Procedure Details Consent: Risks of procedure as well as the alternatives and risks of each were explained to the (patient/caregiver).  Consent for procedure obtained. Time Out: Verified patient identification, verified procedure, site/side was marked, verified correct patient position, special equipment/implants available, medications/allergies/relevent history reviewed, required imaging and test results available.  Performed Real time [place] was used to ID and cannulate the vessel  Maximum sterile technique was used including antiseptics, cap, gloves, gown, hand hygiene, mask and sheet. Skin prep: Chlorhexidine; local anesthetic administered A antimicrobial bonded/coated triple lumen catheter was placed in the left internal jugular vein using the Seldinger technique.  Evaluation Blood flow good Complications: No apparent complications Patient did tolerate procedure well. Chest X-ray ordered to verify placement.  CXR: pending.  Clementeen Graham 08/16/2016, 1:53 PM Erick Colace ACNP-BC [place] Pager # [phone] OR # [phone] if no answer

## 2016-08-16 NOTE — Plan of Care (Signed)
Problem: Tissue Perfusion: Goal: Risk factors for ineffective tissue perfusion will decrease Outcome: Not Progressing Pts mottling is progressively worse each shift. Fingers and toes continue to become more cyanotic with each shift.   Problem: Bowel/Gastric: Goal: Will not experience complications related to bowel motility Outcome: Not Progressing Pt started experiencing bloody stool today which was confirmed with an occult blood test.   Problem: Activity: Goal: Ability to tolerate increased activity will improve Outcome: Not Progressing Pt continues to be unresponsive.   Problem: Nutritional: Goal: Intake of prescribed amount of daily calories will improve Outcome: Not Progressing Unable to give nutrition due to possible ileus.   Problem: Cardiac: Goal: Ability to achieve and maintain adequate cardiopulmonary perfusion will improve Outcome: Progressing RN able to wean off Epi previous night and slowly decreasing Levophed tonight.

## 2016-08-16 NOTE — Progress Notes (Signed)
CRITICAL VALUE ALERT  Critical Value:  Ca 5.5  Date & Time Notied:  08/16/2016 at 1810  Provider Notified: Dr Halford Chessman  Orders Received/Actions taken: None at this time.

## 2016-08-16 NOTE — Progress Notes (Signed)
Pharmacy Antibiotic Note Casey Chapman is a 31 y.o. female admitted on 08/05/2016 s/p cardiac arrest several lines placed during code.  Afebrile WBC = 41.3 Starting CVVHD today  Plan: Random vancomycin level in AM Vancomycin 750 mg iv Q 24 starting 7/13 after CVVHD begins Change Cefepime to 2 grams iv Q 12 hours starting tonight Plts = 46, stop sq heparin?  Height: 5\' 6"  (167.6 cm) Weight: 166 lb 14.2 oz (75.7 kg) IBW/kg (Calculated) : 59.3  Temp (24hrs), Avg:98.9 F (37.2 C), Min:97.9 F (36.6 C), Max:99.5 F (37.5 C)   Recent Labs Lab 08/06/2016 0952 08/20/2016 1000 08/26/2016 1653  08/29/2016 2011  08/14/16 0358  08/14/16 1533 08/14/16 1757 08/14/16 2213 08/15/16 0250 08/15/16 0847 08/15/16 1109 08/15/16 1447 08/15/16 2115 08/16/16 0303  WBC 16.5*  --  21.8*  --   --   --  24.0*  --   --   --   --  34.1*  --   --   --   --  41.3*  CREATININE 1.77*  --  2.23*  < >  --   < > 2.95*  < > 3.19*  --  3.30* 3.47*  --   --  3.76*  --  4.09*  LATICACIDVEN  --  9.0* 17.1*  --  12.5*  --   --   --   --   --   --   --  3.5* 3.4*  --   --   --   VANCORANDOM  --   --   --   --   --   --   --   --   --  19  --   --   --   --   --  35  --   < > = values in this interval not displayed.  Estimated Creatinine Clearance: 20.4 mL/min (A) (by C-G formula based on SCr of 4.09 mg/dL (H)).    No Known Allergies  Antimicrobials this admission: Vancomycin 7/9 >>  Cefepime  7/9 >>   Dose adjustments this admission: 7/10: VR 19. Ke 0.0324 (t1/2 21 hours). Est trough (24 hours from last dose) = 17.5. 750 q24h provides est peak of 32 and trough of 15.  Microbiology results: 7/9 MRSA PCR: Negative  Thank you Anette Guarneri, PharmD [phone]   08/16/2016 8:38 AM

## 2016-08-16 NOTE — Progress Notes (Signed)
Pt had 2  large bloody diarrhea stoost. Pt has minimal rectal tone, flexiseal placed. Stool occult sent down to lab. RN will continue to monitor.

## 2016-08-16 NOTE — Progress Notes (Signed)
Casey Chapman   DOB:09-21-1985   GY#:[id number]   WYO#:[id number]  Hematology and Oncology follow up note   Subjective:  Pt started HD today, had FFP and plt before dialysis catheter placement. She has mild bleeding at cath line sites, no other bleeding. She remains to be on pressor, underwent, unresponsive.   I spoke with her nurse again at bedside.    Objective:  Vitals:   08/16/16 1600 08/16/16 1653  BP: (!) 115/53 (!) 115/53  Pulse: (!) 123 (!) 103  Resp: (!) 28 (!) 31  Temp: 98.8 F (37.1 C) 98.4 F (36.9 C)    Body mass index is 26.94 kg/m.  Intake/Output Summary (Last 24 hours) at 08/16/16 1707 Last data filed at 08/16/16 1600  Gross per 24 hour  Intake          2933.41 ml  Output               33 ml  Net          2900.41 ml    The patient is intubated, remains to be on the mechanical ventilator, unresponsive  Sclerae unicteric  Oropharynx clear  No peripheral adenopathy  Lungs clear -- no rales or rhonchi  Heart regular rate and rhythm  Abdomen benign  (+) fingertip discoloration, and ulceration  Neuro nonfocal   CBG (last 3)   Recent Labs  08/16/16 0748 08/16/16 1231 08/16/16 1556  GLUCAP 92 105* 110*     Labs:  Lab Results  Component Value Date   WBC 41.3 (H) 08/16/2016   HGB 7.8 (L) 08/16/2016   HCT 22.5 (L) 08/16/2016   MCV 83.6 08/16/2016   PLT 46 (L) 08/16/2016   NEUTROABS 24.5 (H) 08/15/2016    CMP Latest Ref Rng & Units 08/16/2016 08/16/2016 08/15/2016  Glucose 65 - 99 mg/dL 108(H) 100(H) 91  BUN 6 - 20 mg/dL 69(H) 65(H) 57(H)  Creatinine 0.44 - 1.00 mg/dL 4.28(H) 4.09(H) 3.76(H)  Sodium 135 - 145 mmol/L 130(L) 131(L) 131(L)  Potassium 3.5 - 5.1 mmol/L 5.5(H) 5.6(H) 5.4(H)  Chloride 101 - 111 mmol/L 97(L) 98(L) 97(L)  CO2 22 - 32 mmol/L 18(L) 18(L) 18(L)  Calcium 8.9 - 10.3 mg/dL 5.5(LL) 5.7(LL) 6.3(LL)  Total Protein 6.5 - 8.1 g/dL - - -  Total Bilirubin 0.3 - 1.2 mg/dL - - -  Alkaline Phos 38 - 126 U/L - - -  AST 15 - 41 U/L - - -   ALT 14 - 54 U/L - - -     Urine Studies No results for input(s): UHGB, CRYS in the last 72 hours.  Invalid input(s): UACOL, UAPR, USPG, UPH, UTP, UGL, UKET, UBIL, UNIT, UROB, ULEU, UEPI, UWBC, Junie Panning [place], [place], [place]  Basic Metabolic Panel:  Recent Labs Lab 08/11/2016 0631 08/16/2016 7741 08/06/2016 1653  08/30/2016 2006  08/14/16 1533 08/14/16 2213 08/15/16 0250 08/15/16 1447 08/16/16 0303 08/16/16 1613  NA 134* 134* 159*  < > 136  < > 135 135 135 131* 131* 130*  K 5.1 5.6* 5.4*  < > 3.0*  < > 4.6 4.6 5.0 5.4* 5.6* 5.5*  CL 99* 100* 92*  < > 95*  < > 98* 98* 101 97* 98* 97*  CO2 15* 13* 40*  < > 20*  < > 20*  --  19* 18* 18* 18*  GLUCOSE 88 131* 133*  < > 183*  < > 128* 103* 69 91 100* 108*  BUN 26* 30* 32*  < > 38*  < >  48* 48* 54* 57* 65* 69*  CREATININE 1.46* 1.77* 2.23*  < > 2.61*  < > 3.19* 3.30* 3.47* 3.76* 4.09* 4.28*  CALCIUM 8.2* 8.0* 8.2*  < > 7.3*  < > 5.7*  --  7.4* 6.3* 5.7* 5.5*  MG 2.6* 2.4 2.3  --  1.9  --  2.1  --   --   --   --   --   PHOS 10.6* 9.8* 11.2*  --   --   --   --   --   --   --   --  6.0*  < > = values in this interval not displayed. GFR Estimated Creatinine Clearance: 19.4 mL/min (A) (by C-G formula based on SCr of 4.28 mg/dL (H)). Liver Function Tests:  Recent Labs Lab 08/06/2016 1653 08/14/16 0358 08/15/16 1100 08/16/16 1613  AST 732* 1,901* 1,155*  --   ALT 265* 643* 668*  --   ALKPHOS 58 63 62  --   BILITOT 0.6 1.0 3.3*  --   PROT 3.9* 4.5* 4.9*  --   ALBUMIN 1.8* 2.0* 2.5* 2.3*   No results for input(s): LIPASE, AMYLASE in the last 168 hours. No results for input(s): AMMONIA in the last 168 hours. Coagulation profile  Recent Labs Lab 08/31/2016 1653 08/15/16 1040  INR 2.90 2.37    CBC:  Recent Labs Lab 08/25/2016 0952 08/23/2016 1653 08/14/16 0358 08/14/16 2213 08/15/16 0250 08/15/16 0841 08/16/16 0303  WBC 16.5* 21.8* 24.0*  --  34.1*  --  41.3*  NEUTROABS  --  13.5*  --   --  24.5*  --   --   HGB 8.6* 6.3* 9.1*  9.2* 7.0* 8.7* 7.8*  HCT 26.8*  26.9* 20.0* 26.9* 27.0* 20.6* 26.3* 22.5*  MCV 90.2 88.9 87.9  --  84.4  --  83.6  PLT 40* 34* 48*  --  48*  --  46*   Cardiac Enzymes:  Recent Labs Lab 08/21/2016 0631 08/22/2016 0952 08/26/2016 1653 08/22/2016 1820 08/14/16 0358 08/15/16 1040  CKTOTAL  --   --   --   --   --  5,732*  TROPONINI 7.21* 42.94* >65.00* >65.00* >65.00*  --    BNP: Invalid input(s): POCBNP CBG:  Recent Labs Lab 08/15/16 2313 08/16/16 0304 08/16/16 0748 08/16/16 1231 08/16/16 1556  GLUCAP 122* 102* 92 105* 110*   D-Dimer  Recent Labs  08/15/16 1447  DDIMER >20.00*   Hgb A1c No results for input(s): HGBA1C in the last 72 hours. Lipid Profile No results for input(s): CHOL, HDL, LDLCALC, TRIG, CHOLHDL, LDLDIRECT in the last 72 hours. Thyroid function studies No results for input(s): TSH, T4TOTAL, T3FREE, THYROIDAB in the last 72 hours.  Invalid input(s): FREET3 Anemia work up  Recent Labs  08/15/16 0841 08/16/16 0303  RETICCTPCT 2.6 2.8   Microbiology Recent Results (from the past 240 hour(s))  MRSA PCR Screening     Status: None   Collection Time: 08/10/2016  7:10 AM  Result Value Ref Range Status   MRSA by PCR NEGATIVE NEGATIVE Final    Comment:        The GeneXpert MRSA Assay (FDA approved for NASAL specimens only), is one component of a comprehensive MRSA colonization surveillance program. It is not intended to diagnose MRSA infection nor to guide or monitor treatment for MRSA infections.   MRSA PCR Screening     Status: Abnormal   Collection Time: 08/22/2016  9:52 AM  Result Value Ref Range Status   MRSA  by PCR POSITIVE (A) NEGATIVE Final    Comment:        The GeneXpert MRSA Assay (FDA approved for NASAL specimens only), is one component of a comprehensive MRSA colonization surveillance program. It is not intended to diagnose MRSA infection nor to guide or monitor treatment for MRSA infections. RESULT CALLED TO, READ BACK BY AND  VERIFIED WITH: SUGGS,Z @ 1236 ON [id number] BY POTEAT,S DELTA CHECK NOTED   Culture, blood (Routine X 2) w Reflex to ID Panel     Status: None (Preliminary result)   Collection Time: 08/15/16 11:28 AM  Result Value Ref Range Status   Specimen Description BLOOD LEFT ARM  Final   Special Requests IN PEDIATRIC BOTTLE Blood Culture adequate volume  Final   Culture NO GROWTH < 24 HOURS  Final   Report Status PENDING  Incomplete  Culture, blood (Routine X 2) w Reflex to ID Panel     Status: None (Preliminary result)   Collection Time: 08/15/16 11:28 AM  Result Value Ref Range Status   Specimen Description BLOOD LEFT ARM  Final   Special Requests IN PEDIATRIC BOTTLE Blood Culture adequate volume  Final   Culture NO GROWTH < 24 HOURS  Final   Report Status PENDING  Incomplete      Studies:  Ct Head Wo Contrast  Result Date: 08/14/2016 CLINICAL DATA:  Cardiac arrest yesterday. Multiple seizures. History of malignant hypertension, autoimmune hemolytic anemia. EXAM: CT HEAD WITHOUT CONTRAST TECHNIQUE: Contiguous axial images were obtained from the base of the skull through the vertex without intravenous contrast. COMPARISON:  CT HEAD August 13, 2016 FINDINGS: Limited assessment due to streak artifact from multiple wires and, coli blanket BRAIN: No intraparenchymal hemorrhage, mass effect nor midline shift. Preservation of gray-white matter differentiation. The ventricles and sulci are normal. No acute large vascular territory infarcts. No abnormal extra-axial fluid collections. Basal cisterns are patent. VASCULAR: Unremarkable. SKULL/SOFT TISSUES: No skull fracture. No significant soft tissue swelling. ORBITS/SINUSES: The included ocular globes and orbital contents are normal.Mild paranasal sinus mucosal thickening without air-fluid levels. OTHER: Bilateral punctate parotid sialolith. Life-support lines in place. IMPRESSION: No acute intracranial process ; no CT findings of hypoxic ischemic encephalopathy.  Electronically Signed   By: Elon Alas M.D.   On: 08/14/2016 22:13   Dg Chest Port 1 View  Result Date: 08/16/2016 CLINICAL DATA:  Left hemodialysis catheter insertion. EXAM: PORTABLE CHEST 1 VIEW COMPARISON:  08/15/2016 FINDINGS: 1435 hours. Endotracheal tube tip is 3.2 cm above the base of the carina. The NG tube passes into the stomach although the distal tip position is not included on the film. Right IJ central line tip overlies the distal SVC level, near the junction with the RA. Left pulmonary artery catheter has been removed in the interval and new left IJ central line is visualized with the tip overlying the SVC/ RA junction, potentially just into the upper right atrium. No evidence for pneumothorax on either side. Cardiopericardial silhouette is enlarged. Interval development of bibasilar atelectasis without appreciable pleural effusion. Telemetry leads overlie the chest. IMPRESSION: 1. New left IJ central line tip is at or just below the SVC/RA junction. 2. No pneumothorax. 3. Interval progression of bibasilar atelectasis. 4. Cardiopericardial silhouette remains enlarged. Electronically Signed   By: Misty Stanley M.D.   On: 08/16/2016 14:43   Dg Chest Port 1 View  Result Date: 08/15/2016 CLINICAL DATA:  Acute respiratory failure. Autoimmune hemolytic anemia. EXAM: PORTABLE CHEST 1 VIEW COMPARISON:  08/14/2016. FINDINGS: Persistent enlargement of  cardiac silhouette. Slight decrease lung volumes. Continued improvement in pulmonary edema. Support tubes and lines are stable. IMPRESSION: Stable cardiac silhouette. Continued improvement in pulmonary edema. Electronically Signed   By: Staci Righter M.D.   On: 08/15/2016 08:44    Assessment: 31 y.o. female with a she has a past medical history of Chlamydia (04/16/12); Eczema; and Hemolytic anemia ([place]) (2012), mild diffuse adenopathy probably benign, presented with severe hypertension and cardiac arrest.   1. Cardiac arrest and cardiogenic  shock 2. Pericardial effusion, and cardiomyopathy  (improved) 3. AKI, HD started on 7/12 4. Shock liver  5. History of hemolytic anemia, worsening anemia 6. thrombocytopenia  Plan:  -she has lab evidence of hemolysis, but coomb's test (-). Her anemia is likely multifactorial, secondary to her DIC, cardiogenic shock and renal failure  -thrombocytopenia probably related to autoimmune process, DIC and bone marrow inhibition from cardiogenic shock, I have low suspicion for TTP, ADMTS13 result is pending  -continue supportive care, blood transfusion as needed  -family meeting pending. Pt's overall prognosis is garded -I will follow up as needed .   This document serves as a record of services personally performed by Truitt Merle, MD. It was created on her behalf by Brandt Loosen, a trained medical scribe. The creation of this record is based on the scribe's personal observations and the provider's statements to them. This document has been checked and approved by the attending provider.   Truitt Merle, MD 08/16/2016  5:07 PM

## 2016-08-16 NOTE — Progress Notes (Signed)
CRITICAL VALUE ALERT  Critical Value:  Hgb 6.0  Date & Time Notied:  08/16/2016 at 1900  Provider Notified: Dr Halford Chessman  Orders Received/Actions taken: Orders written

## 2016-08-16 NOTE — Progress Notes (Signed)
Advanced Heart Failure Team Progress Note   HPI:    Remains intubated. On NE 24 and milrinone 0.5. Sedation help but remains non-responsive.  Anuric. Extremities mottled.   Renal and Heme have seen.    Objective:    Vital Signs:   Temp:  [96.8 F (36 C)-99.3 F (37.4 C)] 96.8 F (36 C) (07/12 1900) Pulse Rate:  [103-145] 119 (07/12 1900) Resp:  [18-33] 22 (07/12 1900) BP: (97-149)/(45-82) 132/69 (07/12 1900) SpO2:  [41 %-100 %] 98 % (07/12 1900) Arterial Line BP: (105-174)/(37-75) 132/59 (07/12 1900) FiO2 (%):  [40 %] 40 % (07/12 1653) Weight:  [75.7 kg (166 lb 14.2 oz)] 75.7 kg (166 lb 14.2 oz) (07/12 0500) Last BM Date: 08/15/16  Weight change: Filed Weights   08/14/16 0400 08/15/16 0500 08/16/16 0500  Weight: 68 kg (149 lb 14.6 oz) 73.8 kg (162 lb 11.2 oz) 75.7 kg (166 lb 14.2 oz)    Intake/Output:   Intake/Output Summary (Last 24 hours) at 08/16/16 1903 Last data filed at 08/16/16 1900  Gross per 24 hour  Intake          3035.78 ml  Output              449 ml  Net          2586.78 ml      Physical Exam    General:  Intubated. Nonresponsive. +gag HEENT: normal ETT Neck: supple lines in place Cor: PMI nondisplaced. Regular. tachy Lungs: + rhonchi Abdomen: soft. NT. ND hypoactive BS Extremities: cool mottled peripherally Fingers turing black with digital ulcerations Neuro: intubated nonresponsive  Telemetry   Sinus tach 100-20s. Personally reviewed   EKG    NA  Labs   Basic Metabolic Panel:  Recent Labs Lab 08/29/2016 0631 08/25/2016 6962 08/23/2016 1653  08/20/2016 2006  08/14/16 1533 08/14/16 2213 08/15/16 0250 08/15/16 1447 08/16/16 0303 08/16/16 1613  NA 134* 134* 159*  < > 136  < > 135 135 135 131* 131* 130*  K 5.1 5.6* 5.4*  < > 3.0*  < > 4.6 4.6 5.0 5.4* 5.6* 5.5*  CL 99* 100* 92*  < > 95*  < > 98* 98* 101 97* 98* 97*  CO2 15* 13* 40*  < > 20*  < > 20*  --  19* 18* 18* 18*  GLUCOSE 88 131* 133*  < > 183*  < > 128* 103* 69 91  100* 108*  BUN 26* 30* 32*  < > 38*  < > 48* 48* 54* 57* 65* 69*  CREATININE 1.46* 1.77* 2.23*  < > 2.61*  < > 3.19* 3.30* 3.47* 3.76* 4.09* 4.28*  CALCIUM 8.2* 8.0* 8.2*  < > 7.3*  < > 5.7*  --  7.4* 6.3* 5.7* 5.5*  MG 2.6* 2.4 2.3  --  1.9  --  2.1  --   --   --   --   --   PHOS 10.6* 9.8* 11.2*  --   --   --   --   --   --   --   --  6.0*  < > = values in this interval not displayed.  Liver Function Tests:  Recent Labs Lab 08/12/2016 1653 08/14/16 0358 08/15/16 1100 08/16/16 1613  AST 732* 1,901* 1,155*  --   ALT 265* 643* 668*  --   ALKPHOS 58 63 62  --   BILITOT 0.6 1.0 3.3*  --   PROT 3.9* 4.5* 4.9*  --  ALBUMIN 1.8* 2.0* 2.5* 2.3*   No results for input(s): LIPASE, AMYLASE in the last 168 hours. No results for input(s): AMMONIA in the last 168 hours.  CBC:  Recent Labs Lab 08/15/2016 1653 08/14/16 0358 08/14/16 2213 08/15/16 0250 08/15/16 0841 08/16/16 0303 08/16/16 1832  WBC 21.8* 24.0*  --  34.1*  --  41.3* 29.9*  NEUTROABS 13.5*  --   --  24.5*  --   --   --   HGB 6.3* 9.1* 9.2* 7.0* 8.7* 7.8* 6.0*  HCT 20.0* 26.9* 27.0* 20.6* 26.3* 22.5* 17.1*  MCV 88.9 87.9  --  84.4  --  83.6 82.2  PLT 34* 48*  --  48*  --  46* PENDING    Cardiac Enzymes:  Recent Labs Lab 08/06/2016 0631 08/28/2016 0952 08/23/2016 1653 08/28/2016 1820 08/14/16 0358 08/15/16 1040  CKTOTAL  --   --   --   --   --  5,732*  TROPONINI 7.21* 42.94* >65.00* >65.00* >65.00*  --     BNP: BNP (last 3 results)  Recent Labs  05/02/16 1740  BNP 131.6*    ProBNP (last 3 results) No results for input(s): PROBNP in the last 8760 hours.   CBG:  Recent Labs Lab 08/15/16 2313 08/16/16 0304 08/16/16 0748 08/16/16 1231 08/16/16 1556  GLUCAP 122* 102* 92 105* 110*    Coagulation Studies:  Recent Labs  08/15/16 1040  LABPROT 26.3*  INR 2.37     Imaging   Dg Chest Port 1 View  Result Date: 08/16/2016 CLINICAL DATA:  Left hemodialysis catheter insertion. EXAM: PORTABLE CHEST  1 VIEW COMPARISON:  08/15/2016 FINDINGS: 1435 hours. Endotracheal tube tip is 3.2 cm above the base of the carina. The NG tube passes into the stomach although the distal tip position is not included on the film. Right IJ central line tip overlies the distal SVC level, near the junction with the RA. Left pulmonary artery catheter has been removed in the interval and new left IJ central line is visualized with the tip overlying the SVC/ RA junction, potentially just into the upper right atrium. No evidence for pneumothorax on either side. Cardiopericardial silhouette is enlarged. Interval development of bibasilar atelectasis without appreciable pleural effusion. Telemetry leads overlie the chest. IMPRESSION: 1. New left IJ central line tip is at or just below the SVC/RA junction. 2. No pneumothorax. 3. Interval progression of bibasilar atelectasis. 4. Cardiopericardial silhouette remains enlarged. Electronically Signed   By: Kennith Center M.D.   On: 08/16/2016 14:43     Medications:     Current Medications: . chlorhexidine gluconate (MEDLINE KIT)  15 mL Mouth Rinse BID  . Chlorhexidine Gluconate Cloth  6 each Topical Daily  . insulin aspart  0-20 Units Subcutaneous Q4H  . mouth rinse  15 mL Mouth Rinse Q2H  . methylPREDNISolone (SOLU-MEDROL) injection  125 mg Intravenous Q6H  . mupirocin ointment  1 application Nasal BID  . pantoprazole (PROTONIX) IV  40 mg Intravenous Q24H  . sodium chloride flush  10-40 mL Intracatheter Q12H    Infusions: . sodium chloride    . sodium chloride 10 mL/hr at 08/16/16 0800  . sodium chloride    . sodium chloride    . amiodarone 30 mg/hr (08/16/16 1643)  . ceFEPime (MAXIPIME) IV 2 g (08/16/16 1835)  . epinephrine Stopped (08/16/16 0200)  . fentaNYL infusion INTRAVENOUS Stopped (08/14/16 0400)  . levETIRAcetam Stopped (08/16/16 1157)  . milrinone 0.5 mcg/kg/min (08/16/16 1228)  . norepinephrine (LEVOPHED) Adult infusion  12 mcg/min (08/16/16 1857)  . dialysis  replacement fluid (prismasate) 500 mL/hr at 08/16/16 1531  . dialysis replacement fluid (prismasate) 200 mL/hr at 08/16/16 1531  . dialysate (PRISMASATE) 1,000 mL/hr at 08/16/16 1531  . sodium chloride    . 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   Assessment/Plan   1. Cardiogenic +/- septic shock  - Trop > 65% - Echo on 7/9 shows LVEF 20-25% with severe RV dysfunction. Repeat bedside echo 7/11 EF 55% moderate to large effusion - EF has recovered but still requiring high dose intoropes and now with evidence of severe anoxic brain injury and poor perfusion with extremity ischemia 2. 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 Today is day #2 of pulse solumedrol 1g daily. After 3 days switch back to 1160q6 3. Cardiac arrest  --7/9 PEA arrest 4. Acute respiratory failure --CXR with marked clearing after steroids. Suggestive of acute pneumonitis picture --Remains on vent 40% FiO2 5. AKI -now anuric. Will needCVVHD 6. Shock liver 7. Accelerated HTN over past several weeks 8. Auto-immune hemolytic anemia s/p splenectomy 9. Anoxic brain injury - Head CT 7/10 negative 10. Seizure activity -Neurology has seen. Getting ECG> 11. Symptomatic anemia  She remains critically ill. 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   Currently appears to have severe anoxic brain injury and limb ischemia. Prognosis likely very poor  Will continue vasopressors for now. Family updated  CRITICAL CARE Performed by: [name]  Total critical care time: 35 minutes  Critical care time was exclusive of separately billable procedures and treating other patients.  Critical care was  necessary to treat or prevent imminent or life-threatening deterioration.  Critical care was time spent personally by me (independent of midlevel providers or residents) on the following activities: development of treatment plan with patient and/or surrogate as well as nursing, discussions with consultants, evaluation of patient's response to treatment, examination of patient, obtaining history from patient or surrogate, ordering and performing treatments and interventions, ordering and review of laboratory studies, ordering and review of radiographic studies, pulse oximetry and re-evaluation of patient's condition.      Length of Stay: 3  [name], MD  08/16/2016, 7:03 PM  Advanced Heart Failure Team Pager [phone] (M-F; 7a - 4p)  Please contact [facility] for night-coverage after hours (4p -7a ) and weekends on [email or web address]

## 2016-08-16 NOTE — Progress Notes (Signed)
LTM taken down per Dr. Rory Percy. No skin breakdown seen

## 2016-08-16 NOTE — Progress Notes (Signed)
eLink Physician-Brief Progress Note Patient Name: Casey Chapman DOB: [birth year] MRN: [id number]   Date of Service  08/16/2016  HPI/Events of Note  Noted to have blood in stool.  eICU Interventions  Will hold SQ heparin.  Check CBC.     Intervention Category Major Interventions: Other:  Sanda Dejoy 08/16/2016, 6:14 PM

## 2016-08-16 NOTE — Progress Notes (Signed)
[facility] Progress Note Patient Name: Casey Chapman DOB: 01/28/1986 MRN: [id number]   Date of Service  08/16/2016  HPI/Events of Note  CBC Latest Ref Rng & Units 08/16/2016 08/16/2016 08/15/2016  WBC 4.0 - 10.5 K/uL 29.9(H) 41.3(H) -  Hemoglobin 12.0 - 15.0 g/dL 6.0(LL) 7.8(L) 8.7(L)  Hematocrit 36.0 - 46.0 % 17.1(L) 22.5(L) 26.3(L)  Platelets 150 - 400 K/uL PENDING 46(L) -     eICU Interventions   Transfuse 1 unit PRBC      Intervention Category Major Interventions: Other:  Casey Chapman 08/16/2016, 7:04 PM

## 2016-08-16 NOTE — Progress Notes (Signed)
PCCM Interval Progress Note  Noted to have bloody loose stools this evening with minimal rectal tone.  Flexi seal placed and since placement, has had ~ 150-200cc dark bloody output.  Hgb 6, 1u PRBC ordered.  Has also had persistent thrombocytopenia which has remained the same (around 40 - 50.  No change in neuro status, remains unresponsive.  Per RN, family meeting planned for AM 7/13 to address goals of care.  Will defer GI consult for now.  If has continued bloody output with persistent anemia then may need GI to see but will hopefully not need and be able to wait until goals of care discussions have been had tomorrow morning before further interventions.   Montey Hora, [facility] Pager: [phone]  or [phone] 08/16/2016, 7:30 PM

## 2016-08-16 NOTE — Progress Notes (Addendum)
Subjective: No change in exam today  Objective: Current vital signs: BP (!) 118/57   Pulse (!) 115   Temp 98.9 F (37.2 C) (Core (Comment))   Resp (!) 25   Ht '5\' 6"'$  (1.676 m)   Wt 75.7 kg (166 lb 14.2 oz)   LMP 08/09/2016 (Exact Date) Comment: negative beta HCG 08/09/2016  SpO2 98%   BMI 26.94 kg/m  Vital signs in last 24 hours: Temp:  [97.9 F (36.6 C)-99.5 F (37.5 C)] 98.9 F (37.2 C) (07/12 0823) Pulse Rate:  [115-175] 115 (07/12 0836) Resp:  [18-30] 25 (07/12 0836) BP: (83-149)/(52-82) 118/57 (07/12 0836) SpO2:  [87 %-100 %] 98 % (07/12 0836) Arterial Line BP: (83-174)/(43-75) 131/53 (07/12 0700) FiO2 (%):  [40 %] 40 % (07/12 0836) Weight:  [75.7 kg (166 lb 14.2 oz)] 75.7 kg (166 lb 14.2 oz) (07/12 0500)  Intake/Output from previous day: 07/11 0701 - 07/12 0700 In: 2832 [I.V.:2090; Blood:300; IV Piggyback:442] Out: 0  Intake/Output this shift: Total I/O In: 63.2 [I.V.:63.2] Out: -  Nutritional status: Diet NPO time specified  ROS:                                                                                                                                       History obtained from unobtainable from patient due to mental status  Neurologic Exam: Mental Status: Patient does not respond to verbal stimuli.  Does not respond to deep sternal rub.  Does not follow commands.  No verbalizations noted.  Cranial Nerves: II: patient does not respond confrontation bilaterally, pupils right 2 mm, left 2 mm,and reactive bilaterally III,IV,VI: doll's response present bilaterally. V,VII: corneal reflex present bilaterally  VIII: patient does not respond to verbal stimuli IX,X: gag reflex present, XI: trapezius strength unable to test bilaterally XII: tongue strength unable to test Motor: Extremities flaccid throughout.  No spontaneous movement noted.  No purposeful movements noted. Sensory: Does not respond to noxious stimuli in any extremity. Deep Tendon Reflexes:  Absent  throughout. Plantars: equivocal bilaterally Cerebellar: Unable to perform      Lab Results: Basic Metabolic Panel:  Recent Labs Lab 08/15/2016 0631 08/28/2016 0952 08/27/2016 1653  08/07/2016 2006  08/14/16 1134 08/14/16 1533 08/14/16 2213 08/15/16 0250 08/15/16 1447 08/16/16 0303  NA 134* 134* 159*  < > 136  < > 135 135 135 135 131* 131*  K 5.1 5.6* 5.4*  < > 3.0*  < > 4.4 4.6 4.6 5.0 5.4* 5.6*  CL 99* 100* 92*  < > 95*  < > 96* 98* 98* 101 97* 98*  CO2 15* 13* 40*  < > 20*  < > 20* 20*  --  19* 18* 18*  GLUCOSE 88 131* 133*  < > 183*  < > 118* 128* 103* 69 91 100*  BUN 26* 30* 32*  < > 38*  < > 46* 48* 48* 54* 57* 65*  CREATININE 1.46* 1.77* 2.23*  < > 2.61*  < > 3.21* 3.19* 3.30* 3.47* 3.76* 4.09*  CALCIUM 8.2* 8.0* 8.2*  < > 7.3*  < > 6.2* 5.7*  --  7.4* 6.3* 5.7*  MG 2.6* 2.4 2.3  --  1.9  --   --  2.1  --   --   --   --   PHOS 10.6* 9.8* 11.2*  --   --   --   --   --   --   --   --   --   < > = values in this interval not displayed.  Liver Function Tests:  Recent Labs Lab 08/20/2016 1653 08/14/16 0358 08/15/16 1100  AST 732* 1,901* 1,155*  ALT 265* 643* 668*  ALKPHOS 58 63 62  BILITOT 0.6 1.0 3.3*  PROT 3.9* 4.5* 4.9*  ALBUMIN 1.8* 2.0* 2.5*   No results for input(s): LIPASE, AMYLASE in the last 168 hours. No results for input(s): AMMONIA in the last 168 hours.  CBC:  Recent Labs Lab 09/04/2016 0952 08/16/2016 1653 08/14/16 0358 08/14/16 2213 08/15/16 0250 08/15/16 0841 08/16/16 0303  WBC 16.5* 21.8* 24.0*  --  34.1*  --  41.3*  NEUTROABS  --  13.5*  --   --  24.5*  --   --   HGB 8.6* 6.3* 9.1* 9.2* 7.0* 8.7* 7.8*  HCT 26.8*  26.9* 20.0* 26.9* 27.0* 20.6* 26.3* 22.5*  MCV 90.2 88.9 87.9  --  84.4  --  83.6  PLT 40* 34* 48*  --  48*  --  46*    Cardiac Enzymes:  Recent Labs Lab 08/24/2016 0631 09/01/2016 0952 08/27/2016 1653 08/24/2016 1820 08/14/16 0358 08/15/16 1040  CKTOTAL  --   --   --   --   --  5,732*  TROPONINI 7.21* 42.94* >65.00* >65.00*  >65.00*  --     Lipid Panel:  Recent Labs Lab 08/27/2016 0631  TRIG 252*    CBG:  Recent Labs Lab 08/15/16 1609 08/15/16 1957 08/15/16 2313 08/16/16 0304 08/16/16 0748  GLUCAP 94 98 122* 102* 92    Microbiology: Results for orders placed or performed during the hospital encounter of 08/09/2016  MRSA PCR Screening     Status: None   Collection Time: 09/01/2016  7:10 AM  Result Value Ref Range Status   MRSA by PCR NEGATIVE NEGATIVE Final    Comment:        The GeneXpert MRSA Assay (FDA approved for NASAL specimens only), is one component of a comprehensive MRSA colonization surveillance program. It is not intended to diagnose MRSA infection nor to guide or monitor treatment for MRSA infections.   MRSA PCR Screening     Status: Abnormal   Collection Time: 08/31/2016  9:52 AM  Result Value Ref Range Status   MRSA by PCR POSITIVE (A) NEGATIVE Final    Comment:        The GeneXpert MRSA Assay (FDA approved for NASAL specimens only), is one component of a comprehensive MRSA colonization surveillance program. It is not intended to diagnose MRSA infection nor to guide or monitor treatment for MRSA infections. RESULT CALLED TO, READ BACK BY AND VERIFIED WITH: SUGGS,Z @ 1236 ON [id number] BY POTEAT,S DELTA CHECK NOTED     Coagulation Studies:  Recent Labs  09/04/2016 1653 08/15/16 1040  LABPROT 31.0* 26.3*  INR 2.90 2.37    Imaging: Ct Head Wo Contrast  Result Date: 08/14/2016 CLINICAL DATA:  Cardiac arrest yesterday. Multiple seizures.  History of malignant hypertension, autoimmune hemolytic anemia. EXAM: CT HEAD WITHOUT CONTRAST TECHNIQUE: Contiguous axial images were obtained from the base of the skull through the vertex without intravenous contrast. COMPARISON:  CT HEAD August 13, 2016 FINDINGS: Limited assessment due to streak artifact from multiple wires and, coli blanket BRAIN: No intraparenchymal hemorrhage, mass effect nor midline shift. Preservation of gray-white  matter differentiation. The ventricles and sulci are normal. No acute large vascular territory infarcts. No abnormal extra-axial fluid collections. Basal cisterns are patent. VASCULAR: Unremarkable. SKULL/SOFT TISSUES: No skull fracture. No significant soft tissue swelling. ORBITS/SINUSES: The included ocular globes and orbital contents are normal.Mild paranasal sinus mucosal thickening without air-fluid levels. OTHER: Bilateral punctate parotid sialolith. Life-support lines in place. IMPRESSION: No acute intracranial process ; no CT findings of hypoxic ischemic encephalopathy. Electronically Signed   By: Elon Alas M.D.   On: 08/14/2016 22:13   Dg Chest Port 1 View  Result Date: 08/15/2016 CLINICAL DATA:  Acute respiratory failure. Autoimmune hemolytic anemia. EXAM: PORTABLE CHEST 1 VIEW COMPARISON:  08/14/2016. FINDINGS: Persistent enlargement of cardiac silhouette. Slight decrease lung volumes. Continued improvement in pulmonary edema. Support tubes and lines are stable. IMPRESSION: Stable cardiac silhouette. Continued improvement in pulmonary edema. Electronically Signed   By: Staci Righter M.D.   On: 08/15/2016 08:44    Medications:  Scheduled: . chlorhexidine gluconate (MEDLINE KIT)  15 mL Mouth Rinse BID  . Chlorhexidine Gluconate Cloth  6 each Topical Daily  . heparin  5,000 Units Subcutaneous Q8H  . insulin aspart  0-20 Units Subcutaneous Q4H  . mouth rinse  15 mL Mouth Rinse Q2H  . methylPREDNISolone (SOLU-MEDROL) injection  125 mg Intravenous Q6H  . mupirocin ointment  1 application Nasal BID  . pantoprazole (PROTONIX) IV  40 mg Intravenous Q24H  . sodium chloride flush  10-40 mL Intracatheter Q12H    Assessment/Plan:  31 yo F with new onset focal seizures and coma following cardiac arrest. High possibility for anoxic injury. She will need repeat head imaging patient currently hooked up to LTM.  EEG reading reports-diffuse slow activity and loss of normal physiologic patterns.  Reactivity was present, however spontaneous variability was poor. This record was consistent with a diffuse cerebral disturbance/encephalopathy patterns. Currently on 500 mg Keppra BID and will continue this dose for know unless EEG shows changes and then will add 250 mg Keppra after dialysis. Plan is for Dialysis to start to day as her renal function continues to decline ( 3.76 ? to 4.09) .      Recommendations: 1) repeat head CT today 2) continuous EEG 3) Keppra 500 mg BID(high dose given renal function)-if has seizure activity will do as above . 4) Neurology will follow.   Etta Quill PA-C [facility] [phone]  M-F  (8:30 am- 4 PM)  08/16/2016, 9:06 AM            Etta Quill PA-C [facility] [phone]  08/16/2016, 8:59 AM   NEUROHOSPITALIST ADDENDUM Patient seen and examined,. Exam remains unchanged with only brainstem reflexes present. She is getting CRRT now per renal.  A: 31 year old female with focal seizures following cardiac arrest. Suspect underlying autoimmune etiology of cardiac arrest given her multisystem involvement, skin rash etc. EEG for last 24h showed no sz.  Impression: Hypoxic/anoxic brain injury Seizures Status epilepticus - resolved  Rec: -Repeat head CT when stable -continuous EEG can be discontinued -Keppra at 500 twice a day -We will follow with you. Please call with questions.  -- Amie Portland, MD  [facility] [phone]

## 2016-08-17 ENCOUNTER — Encounter (HOSPITAL_COMMUNITY): Payer: Self-pay | Admitting: *Deleted

## 2016-08-17 ENCOUNTER — Inpatient Hospital Stay (HOSPITAL_COMMUNITY): Payer: 59

## 2016-08-17 DIAGNOSIS — D62 Acute posthemorrhagic anemia: Secondary | ICD-10-CM

## 2016-08-17 DIAGNOSIS — Z515 Encounter for palliative care: Secondary | ICD-10-CM

## 2016-08-17 DIAGNOSIS — J9601 Acute respiratory failure with hypoxia: Secondary | ICD-10-CM

## 2016-08-17 LAB — RENAL FUNCTION PANEL
Albumin: 2.3 g/dL — ABNORMAL LOW (ref 3.5–5.0)
Albumin: 2.3 g/dL — ABNORMAL LOW (ref 3.5–5.0)
Anion gap: 14 (ref 5–15)
Anion gap: 12 (ref 5–15)
BUN: 52 mg/dL — ABNORMAL HIGH (ref 6–20)
BUN: 39 mg/dL — AB (ref 6–20)
CALCIUM: 5.8 mg/dL — AB (ref 8.9–10.3)
CHLORIDE: 100 mmol/L — AB (ref 101–111)
CHLORIDE: 102 mmol/L (ref 101–111)
CO2: 19 mmol/L — AB (ref 22–32)
CO2: 22 mmol/L (ref 22–32)
CREATININE: 2.36 mg/dL — AB (ref 0.44–1.00)
Calcium: 6.3 mg/dL — CL (ref 8.9–10.3)
Creatinine, Ser: 2.98 mg/dL — ABNORMAL HIGH (ref 0.44–1.00)
GFR calc Af Amer: 30 mL/min — ABNORMAL LOW (ref >60)
GFR, EST AFRICAN AMERICAN: 23 mL/min — AB (ref >60)
GFR, EST NON AFRICAN AMERICAN: 20 mL/min — AB (ref >60)
GFR, EST NON AFRICAN AMERICAN: 26 mL/min — AB (ref >60)
GLUCOSE: 122 mg/dL — AB (ref 65–99)
Glucose, Bld: 99 mg/dL (ref 65–99)
POTASSIUM: 3.6 mmol/L (ref 3.5–5.1)
Phosphorus: 4.2 mg/dL (ref 2.5–4.6)
Phosphorus: 3.4 mg/dL (ref 2.5–4.6)
Potassium: 4.2 mmol/L (ref 3.5–5.1)
SODIUM: 133 mmol/L — AB (ref 135–145)
Sodium: 136 mmol/L (ref 135–145)

## 2016-08-17 LAB — BLOOD GAS, ARTERIAL
ACID-BASE DEFICIT: 2.3 mmol/L — AB (ref 0.0–2.0)
Bicarbonate: 20.7 mmol/L (ref 20.0–28.0)
FIO2: 40
LHR: 16 {breaths}/min
O2 Saturation: 99.5 %
PEEP/CPAP: 5 cmH2O
PO2 ART: 156 mmHg — AB (ref 83.0–108.0)
Patient temperature: 98.6
VT: 470 mL
pCO2 arterial: 28.3 mmHg — ABNORMAL LOW (ref 32.0–48.0)
pH, Arterial: 7.478 — ABNORMAL HIGH (ref 7.350–7.450)

## 2016-08-17 LAB — BPAM RBC
BLOOD PRODUCT EXPIRATION DATE: 201808022359
Blood Product Expiration Date: 201807142359
Blood Product Expiration Date: 201808022359
ISSUE DATE / TIME: 201807110457
ISSUE DATE / TIME: 201807122008
ISSUE DATE / TIME: 201807092209
UNIT TYPE AND RH: 8400
Unit Type and Rh: 5100
Unit Type and Rh: 5100

## 2016-08-17 LAB — MISC LABCORP TEST (SEND OUT)

## 2016-08-17 LAB — COOXEMETRY PANEL
Carboxyhemoglobin: 1.5 % (ref 0.5–1.5)
Methemoglobin: 1.9 % — ABNORMAL HIGH (ref 0.0–1.5)
O2 Saturation: 91.4 %
TOTAL HEMOGLOBIN: 7.6 g/dL — AB (ref 12.0–16.0)

## 2016-08-17 LAB — HEMOGLOBIN AND HEMATOCRIT, BLOOD
HCT: 26.6 % — ABNORMAL LOW (ref 36.0–46.0)
HEMATOCRIT: 22.7 % — AB (ref 36.0–46.0)
HEMATOCRIT: 21.1 % — AB (ref 36.0–46.0)
HEMATOCRIT: 25.4 % — AB (ref 36.0–46.0)
HEMOGLOBIN: 8.7 g/dL — AB (ref 12.0–15.0)
Hemoglobin: 7.8 g/dL — ABNORMAL LOW (ref 12.0–15.0)
Hemoglobin: 7.3 g/dL — ABNORMAL LOW (ref 12.0–15.0)
Hemoglobin: 9.1 g/dL — ABNORMAL LOW (ref 12.0–15.0)

## 2016-08-17 LAB — GLUCOSE, CAPILLARY
GLUCOSE-CAPILLARY: 108 mg/dL — AB (ref 65–99)
GLUCOSE-CAPILLARY: 111 mg/dL — AB (ref 65–99)
GLUCOSE-CAPILLARY: 115 mg/dL — AB (ref 65–99)
GLUCOSE-CAPILLARY: 123 mg/dL — AB (ref 65–99)
Glucose-Capillary: 102 mg/dL — ABNORMAL HIGH (ref 65–99)
Glucose-Capillary: 112 mg/dL — ABNORMAL HIGH (ref 65–99)
Glucose-Capillary: 125 mg/dL — ABNORMAL HIGH (ref 65–99)

## 2016-08-17 LAB — TYPE AND SCREEN
ABO/RH(D): AB POS
ANTIBODY SCREEN: NEGATIVE
UNIT DIVISION: 0
UNIT DIVISION: 0
Unit division: 0

## 2016-08-17 LAB — CBC
HEMATOCRIT: 22.6 % — AB (ref 36.0–46.0)
Hemoglobin: 7.8 g/dL — ABNORMAL LOW (ref 12.0–15.0)
MCH: 27.7 pg (ref 26.0–34.0)
MCHC: 34.5 g/dL (ref 30.0–36.0)
MCV: 80.1 fL (ref 78.0–100.0)
Platelets: 53 10*3/uL — ABNORMAL LOW (ref 150–400)
RBC: 2.82 MIL/uL — ABNORMAL LOW (ref 3.87–5.11)
RDW: 17.1 % — AB (ref 11.5–15.5)
WBC: 26.6 10*3/uL — AB (ref 4.0–10.5)

## 2016-08-17 LAB — PREPARE FRESH FROZEN PLASMA
UNIT DIVISION: 0
Unit division: 0

## 2016-08-17 LAB — BPAM FFP
BLOOD PRODUCT EXPIRATION DATE: 201807172359
BLOOD PRODUCT EXPIRATION DATE: 201807172359
ISSUE DATE / TIME: 201807121208
ISSUE DATE / TIME: 201807121208
UNIT TYPE AND RH: 8400
Unit Type and Rh: 8400

## 2016-08-17 LAB — BPAM PLATELET PHERESIS
Blood Product Expiration Date: 201807132359
ISSUE DATE / TIME: 201807121120
UNIT TYPE AND RH: 5100

## 2016-08-17 LAB — MAGNESIUM: MAGNESIUM: 2.1 mg/dL (ref 1.7–2.4)

## 2016-08-17 LAB — PROTIME-INR
INR: 1.2
Prothrombin Time: 15.3 seconds — ABNORMAL HIGH (ref 11.4–15.2)

## 2016-08-17 LAB — PREPARE PLATELET PHERESIS: Unit division: 0

## 2016-08-17 LAB — VANCOMYCIN, RANDOM: Vancomycin Rm: 19

## 2016-08-17 LAB — PREPARE RBC (CROSSMATCH)

## 2016-08-17 MED ORDER — SODIUM CHLORIDE 0.9 % IV SOLN
Freq: Once | INTRAVENOUS | Status: AC
  Administered 2016-08-17: 12:00:00 via INTRAVENOUS

## 2016-08-17 MED ORDER — SODIUM CHLORIDE 0.9 % IV SOLN
1.0000 g | Freq: Once | INTRAVENOUS | Status: AC
  Administered 2016-08-17: 1 g via INTRAVENOUS
  Filled 2016-08-17: qty 10

## 2016-08-17 MED ORDER — SODIUM CHLORIDE 0.9 % IV BOLUS (SEPSIS)
250.0000 mL | Freq: Once | INTRAVENOUS | Status: AC
  Administered 2016-08-17: 250 mL via INTRAVENOUS

## 2016-08-17 MED ORDER — SODIUM CHLORIDE 0.9 % IV SOLN
1.0000 mg/kg/h | INTRAVENOUS | Status: DC
  Filled 2016-08-17: qty 100

## 2016-08-17 MED ORDER — VANCOMYCIN HCL IN DEXTROSE 750-5 MG/150ML-% IV SOLN
750.0000 mg | INTRAVENOUS | Status: DC
  Administered 2016-08-17 – 2016-08-18 (×2): 750 mg via INTRAVENOUS
  Filled 2016-08-17 (×3): qty 150

## 2016-08-17 MED ORDER — SODIUM CHLORIDE 0.9 % IV SOLN
20.0000 ug | Freq: Once | INTRAVENOUS | Status: AC
  Administered 2016-08-17: 20 ug via INTRAVENOUS
  Filled 2016-08-17: qty 5

## 2016-08-17 NOTE — Progress Notes (Signed)
Extensive discussion with the entire family.  Discussed entire case.  Informed of my conversation with neurology and the high concern for anoxic brain injury.  Recommended LCB with no CPR or cardioversion while they decide on course of action.  They understandably asked for some time to talk as a family and inform us of details.  Will keep as full code for now.  Transfuse one unit pRBC, DDAVP for platelet dysfunction, consult GI and will revisit when family is ready.  The patient is critically ill with multiple organ systems failure and requires high complexity decision making for assessment and support, frequent evaluation and titration of therapies, application of advanced monitoring technologies and extensive interpretation of multiple databases.   Critical Care Time devoted to patient care services described in this note is  60  Minutes. This time reflects time of care of this signee Dr Jennet Maduro. This critical care time does not reflect procedure time, or teaching time or supervisory time of PA/NP/Med student/Med Resident etc but could involve care discussion time.  Rush Farmer, M.D. [facility] Pulmonary/Critical Care Medicine. Pager: [phone]. After hours pager: [phone].

## 2016-08-17 NOTE — Progress Notes (Signed)
Advanced Heart Failure Team Progress Note   HPI:    Remains intubated. Norepi weaned to 6 mcg. Remains on milrinone 0.5. EEG --> no evidence of seizure activity.  Started CVVHD 7/12. CVP 3-4.     Objective:    Vital Signs:   Temp:  [95.7 F (35.4 C)-99.3 F (37.4 C)] 97.3 F (36.3 C) (07/13 0815) Pulse Rate:  [103-140] 107 (07/13 0815) Resp:  [0-33] 20 (07/13 0815) BP: (97-149)/(45-92) 123/50 (07/13 0800) SpO2:  [41 %-100 %] 100 % (07/13 0815) Arterial Line BP: (91-158)/(37-78) 114/49 (07/13 0815) FiO2 (%):  [40 %] 40 % (07/13 0815) Weight:  [162 lb 14.7 oz (73.9 kg)] 162 lb 14.7 oz (73.9 kg) (07/13 0500) Last BM Date: 08/16/16  Weight change: Filed Weights   08/15/16 0500 08/16/16 0500 08/17/16 0500  Weight: 162 lb 11.2 oz (73.8 kg) 166 lb 14.2 oz (75.7 kg) 162 lb 14.7 oz (73.9 kg)    Intake/Output:   Intake/Output Summary (Last 24 hours) at 08/17/16 0837 Last data filed at 08/17/16 0800  Gross per 24 hour  Intake          3050.44 ml  Output             1778 ml  Net          1272.44 ml      Physical Exam     General:  Intubated. Sedated. Bear Hugger in place HEENT: normal ETT Neck: supple. no JVD. Carotids 2+ bilat; no bruits. No lymphadenopathy or thryomegaly appreciated. Cor: PMI nondisplaced. Tachy regular  Lungs: Rhonchi Abdomen: soft, nontender, nondistended. No hepatosplenomegaly. No bruits or masses. Good bowel sounds. Extremities: cool, no cyanosis, clubbing, rash + edema necrotic fingers Neuro: intubated non responsive.    Telemetry   Sinus Tach 100s.  Personally reviewed   EKG    NA  Labs   Basic Metabolic Panel:  Recent Labs Lab 09/01/2016 0631 08/20/2016 9563 08/28/2016 1653  09/01/2016 2006  08/14/16 1533  08/15/16 0250 08/15/16 1447 08/16/16 0303 08/16/16 1613 08/17/16 0357 08/17/16 0409  NA 134* 134* 159*  < > 136  < > 135  < > 135 131* 131* 130* 133*  --   K 5.1 5.6* 5.4*  < > 3.0*  < > 4.6  < > 5.0 5.4* 5.6* 5.5* 4.2   --   CL 99* 100* 92*  < > 95*  < > 98*  < > 101 97* 98* 97* 100*  --   CO2 15* 13* 40*  < > 20*  < > 20*  --  19* 18* 18* 18* 19*  --   GLUCOSE 88 131* 133*  < > 183*  < > 128*  < > 69 91 100* 108* 99  --   BUN 26* 30* 32*  < > 38*  < > 48*  < > 54* 57* 65* 69* 52*  --   CREATININE 1.46* 1.77* 2.23*  < > 2.61*  < > 3.19*  < > 3.47* 3.76* 4.09* 4.28* 2.98*  --   CALCIUM 8.2* 8.0* 8.2*  < > 7.3*  < > 5.7*  --  7.4* 6.3* 5.7* 5.5* 5.8*  --   MG 2.6* 2.4 2.3  --  1.9  --  2.1  --   --   --   --   --   --  2.1  PHOS 10.6* 9.8* 11.2*  --   --   --   --   --   --   --   --  6.0* 4.2  --   < > = values in this interval not displayed.  Liver Function Tests:  Recent Labs Lab 08/24/2016 1653 08/14/16 0358 08/15/16 1100 08/16/16 1613 08/17/16 0357  AST 732* 1,901* 1,155*  --   --   ALT 265* 643* 668*  --   --   ALKPHOS 58 63 62  --   --   BILITOT 0.6 1.0 3.3*  --   --   PROT 3.9* 4.5* 4.9*  --   --   ALBUMIN 1.8* 2.0* 2.5* 2.3* 2.3*   No results for input(s): LIPASE, AMYLASE in the last 168 hours. No results for input(s): AMMONIA in the last 168 hours.  CBC:  Recent Labs Lab 08/06/2016 1653 08/14/16 0358  08/15/16 0250 08/15/16 0841 08/16/16 0303 08/16/16 1832 08/17/16 0100 08/17/16 0409  WBC 21.8* 24.0*  --  34.1*  --  41.3* 29.9*  --  26.6*  NEUTROABS 13.5*  --   --  24.5*  --   --   --   --   --   HGB 6.3* 9.1*  < > 7.0* 8.7* 7.8* 6.0* 7.8* 7.8*  HCT 20.0* 26.9*  < > 20.6* 26.3* 22.5* 17.1* 22.7* 22.6*  MCV 88.9 87.9  --  84.4  --  83.6 82.2  --  80.1  PLT 34* 48*  --  48*  --  46* 68*  --  53*  < > = values in this interval not displayed.  Cardiac Enzymes:  Recent Labs Lab 08/29/2016 0631 08/16/2016 0952 08/09/2016 1653 08/16/2016 1820 08/14/16 0358 08/15/16 1040  CKTOTAL  --   --   --   --   --  5,732*  TROPONINI 7.21* 42.94* >65.00* >65.00* >65.00*  --     BNP: BNP (last 3 results)  Recent Labs  05/02/16 1740  BNP 131.6*    ProBNP (last 3 results) No results  for input(s): PROBNP in the last 8760 hours.   CBG:  Recent Labs Lab 08/16/16 0748 08/16/16 1231 08/16/16 1556 08/16/16 2359 08/17/16 0357  GLUCAP 92 105* 110* 108* 111*    Coagulation Studies:  Recent Labs  08/15/16 1040  LABPROT 26.3*  INR 2.37     Imaging   Dg Chest Port 1 View  Result Date: 08/17/2016 CLINICAL DATA:  Endotracheal tube EXAM: PORTABLE CHEST 1 VIEW COMPARISON:  08/16/2016 FINDINGS: Stable cardiopericardial enlargement. Bilateral IJ line with tips at the upper cavoatrial junction. Endotracheal tube tip ends at the clavicular heads. An orogastric tube reaches the stomach. Stable cardiopericardial enlargement. Mildly improved aeration, likely diminished edema. No visible effusion or pneumothorax. IMPRESSION: 1. Unremarkable positioning of tubes and lines. 2. Mildly improved aeration.  Stable cardiopericardial enlargement. Electronically Signed   By: Monte Fantasia M.D.   On: 08/17/2016 08:35   Dg Chest Port 1 View  Result Date: 08/16/2016 CLINICAL DATA:  Left hemodialysis catheter insertion. EXAM: PORTABLE CHEST 1 VIEW COMPARISON:  08/15/2016 FINDINGS: 1435 hours. Endotracheal tube tip is 3.2 cm above the base of the carina. The NG tube passes into the stomach although the distal tip position is not included on the film. Right IJ central line tip overlies the distal SVC level, near the junction with the RA. Left pulmonary artery catheter has been removed in the interval and new left IJ central line is visualized with the tip overlying the SVC/ RA junction, potentially just into the upper right atrium. No evidence for pneumothorax on either side. Cardiopericardial silhouette is enlarged. Interval development of bibasilar  atelectasis without appreciable pleural effusion. Telemetry leads overlie the chest. IMPRESSION: 1. New left IJ central line tip is at or just below the SVC/RA junction. 2. No pneumothorax. 3. Interval progression of bibasilar atelectasis. 4.  Cardiopericardial silhouette remains enlarged. Electronically Signed   By: Kennith Center M.D.   On: 08/16/2016 14:43     Medications:     Current Medications: . chlorhexidine gluconate (MEDLINE KIT)  15 mL Mouth Rinse BID  . Chlorhexidine Gluconate Cloth  6 each Topical Daily  . insulin aspart  0-20 Units Subcutaneous Q4H  . mouth rinse  15 mL Mouth Rinse Q2H  . methylPREDNISolone (SOLU-MEDROL) injection  125 mg Intravenous Q6H  . mupirocin ointment  1 application Nasal BID  . pantoprazole (PROTONIX) IV  40 mg Intravenous Q24H  . sodium chloride flush  10-40 mL Intracatheter Q12H    Infusions: . sodium chloride    . sodium chloride 10 mL/hr at 08/17/16 0800  . sodium chloride    . sodium chloride    . sodium chloride    . amiodarone 30 mg/hr (08/17/16 0800)  . calcium gluconate    . ceFEPime (MAXIPIME) IV Stopped (08/16/16 1905)  . epinephrine Stopped (08/16/16 0200)  . fentaNYL infusion INTRAVENOUS Stopped (08/14/16 0400)  . levETIRAcetam Stopped (08/16/16 2318)  . milrinone 0.5 mcg/kg/min (08/17/16 0800)  . norepinephrine (LEVOPHED) Adult infusion 6 mcg/min (08/17/16 0800)  . dialysis replacement fluid (prismasate) 500 mL/hr at 08/17/16 0205  . dialysis replacement fluid (prismasate) 200 mL/hr at 08/16/16 1531  . dialysate (PRISMASATE) 1,000 mL/hr at 08/17/16 0721  . sodium chloride    . sodium chloride    . 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   Assessment/Plan   1. Cardiogenic +/- septic shock  - Trop > 65% - Echo on 7/9 shows LVEF 20-25% with severe RV dysfunction. Repeat bedside echo 7/11 EF 55% moderate to large effusion.   - EF has recovered but still requiring inotropes/vasopressor support and now with evidence of severe anoxic brain injury and poor perfusion with extremity ischemia.   Remains on  milrinone 0.5 mcg and weaning norepi as tolerated.   2. 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 Today is day #3 of pulse solumedrol 1g daily. After 3 days switch back to 1160q6 3. Cardiac arrest  --7/9 PEA arrest 4. Acute respiratory failure --CXR with marked clearing after steroids. Suggestive of acute pneumonitis picture --Remains on vent 40% FiO2 5. AKI -Started CVVHD 08/16/16.  6. Shock liver 7. Accelerated HTN over past several weeks 8. Auto-immune hemolytic anemia s/p splenectomy 9. Anoxic brain injury - Head CT 7/10 negative. EGG - no seizure activity.  10. Seizure activity -Neurology has seen. EEG- No seizure activity.  11. Symptomatic anemia- received 2PURBCs 7/12. Hgb up to 7.8   Length of Stay: 4  Amy Clegg, NP  08/17/2016, 8:37 AM  Advanced Heart Failure Team Pager [phone] (M-F; 7a - 4p)  Please contact [facility] for night-coverage after hours (4p -7a ) and weekends on [email or web address]  Agree.  She remains critically ill with multi-system organ failure.   On exam she is non-responsive on vent despite holding sedatives. Still on vasopressors. Tachcycardia with cool extremities. Severe mottling and gangrenous changes of digits.  Suspect severe anoxic brain injury. Now with evidence of progressive limb ischemia as well.  Prognosis very poor. Family meeting pending to discuss [place]. Would recommend comfort care.   CRITICAL CARE Performed by: [name]  Total critical care time: 35 minutes  Critical care time was exclusive of separately billable procedures and treating other patients.  Critical care was necessary to treat or prevent imminent or life-threatening deterioration.  Critical care was time spent personally by me (independent of midlevel providers or residents) on the following activities: development of treatment plan with patient and/or surrogate as  well as nursing, discussions with consultants, evaluation of patient's response to treatment, examination of patient, obtaining history from patient or surrogate, ordering and performing treatments and interventions, ordering and review of laboratory studies, ordering and review of radiographic studies, pulse oximetry and re-evaluation of patient's condition.  [name], MD  10:25 AM

## 2016-08-17 NOTE — Progress Notes (Signed)
Paged by nurse, family have decided on LCB with no CPR and cardioversion.  They would like to see the results of the MRI prior to major decisions on withdrawal.  Will proceed with LCB status and pending MRI and further discussion will decide on plan of care.  Rush Farmer, M.D. [facility] Pulmonary/Critical Care Medicine. Pager: [phone]. After hours pager: [phone].

## 2016-08-17 NOTE — Progress Notes (Signed)
[place] Progress Note Patient Name: Casey Chapman DOB: 1985-04-13 MRN: [id number]   Date of Service  08/17/2016  HPI/Events of Note  Notified of persistent tachycardia. Review of EKG reveals supraventricular tachycardia. Patient currently with brain injury. MRI pending to determine extent of brain injury. Neurology following.   eICU Interventions  1. Normal saline 250 mL IV fluid bolus 2. Proceed to MRI for imaging      Intervention Category Major Interventions: Other:  Tera Partridge 08/17/2016, 11:34 PM

## 2016-08-17 NOTE — Progress Notes (Signed)
PULMONARY / CRITICAL CARE MEDICINE   Name: Casey Chapman MRN: [id number] DOB: 06/03/1985    ADMISSION DATE:  08/17/2016 CONSULTATION DATE:  08/27/2016  REFERRING MD:  Dr. Randal Chapman   CHIEF COMPLAINT:  HTN   BRIEF SUMMARY:   31 year old female with PMH of autoimmune hemolytic anemia s/p splenectomy, being followed by Dr. Burr Chapman in Hematology. Has reported 60 lb weight loss in past 2 years with history of weakness and swelling in lower extremity.  Presented with pain in hands, feet, marked hypertension to the [facility] ER on 7/9.  Family noted development of a rash around chest/neck over the last year and progressive hypertension.  In the [facility] ER she was very hypertensive which was treated with labetalol, not long afterwards she had a cardiac arrest.  She was moved to [facility] later in the day with severe cardiogenic shock, unexplained airspace disease and recurrent episodes of cardiac arrest.  On 7/9 she had at least 4 episodes of cardiac arrest requiring CPR for approximately 30-45 minutes in total.  Echo on 7/9 showed a moderate pericardial effusion, LVEF 20-25%, RV function severely reduced.   SUBJECTIVE:   Lower GI bleeding overnight with a significant drop in hemoglobin requiring transfusion of one unit Family meeting today at 10 AM No further seizures but remains unresponsive with no sedation for days  VITAL SIGNS: BP (!) 115/50   Pulse (!) 115   Temp 98.1 F (36.7 C)   Resp 20   Ht _0  (1.676 m)   Wt 73.9 kg (162 lb 14.7 oz)   LMP 08/09/2016 (Exact Date) Comment: negative beta HCG 08/17/2016  SpO2 100%   BMI 26.30 kg/m   HEMODYNAMICS: CVP:  [0 mmHg-9 mmHg] 5 mmHg  VENTILATOR SETTINGS: Vent Mode: PRVC FiO2 (%):  [40 %] 40 % Set Rate:  [16 bmp] 16 bmp Vt Set:  [470 mL] 470 mL PEEP:  [5 cmH20] 5 cmH20 Plateau Pressure:  [12 cmH20] 12 cmH20  INTAKE / OUTPUT: I/O last 3 completed shifts: In: 4380.9 [I.V.:2266.7; Blood:1252.2; Other:386; IV [id number]:056] Out: 9794 [IAXKP:5374;  Stool:325]  PHYSICAL EXAMINATION: General:  Casey Chapman AA female, in bed, on vent, completely unresponsive. HENT: Casey Chapman/AT, pupillary reflexes intact, respiratory drive present PULM: Coarse BS diffusely CV: RRR, Nl S1/S2, -M/R/G. GI: Soft, NT, ND and +BS MSK: Normal bulk and tone Neuro: Comatose on vent. Dolls eye neg. Gag intact.   LABS:  BMET  Recent Labs Lab 08/16/16 0303 08/16/16 1613 08/17/16 0357  NA 131* 130* 133*  K 5.6* 5.5* 4.2  CL 98* 97* 100*  CO2 18* 18* 19*  BUN 65* 69* 52*  CREATININE 4.09* 4.28* 2.98*  GLUCOSE 100* 108* 99    Electrolytes  Recent Labs Lab 08/22/2016 1653  08/18/2016 2006  08/14/16 1533  08/16/16 0303 08/16/16 1613 08/17/16 0357 08/17/16 0409  CALCIUM 8.2*  < > 7.3*  < > 5.7*  < > 5.7* 5.5* 5.8*  --   MG 2.3  --  1.9  --  2.1  --   --   --   --  2.1  PHOS 11.2*  --   --   --   --   --   --  6.0* 4.2  --   < > = values in this interval not displayed.  CBC  Recent Labs Lab 08/16/16 0303 08/16/16 1832 08/17/16 0100 08/17/16 0409  WBC 41.3* 29.9*  --  26.6*  HGB 7.8* 6.0* 7.8* 7.8*  HCT 22.5* 17.1* 22.7* 22.6*  PLT 46*  68*  --  53*    Coag's  Recent Labs Lab 08/16/2016 1653 08/15/16 1040  INR 2.90 2.37    Sepsis Markers  Recent Labs Lab 09/01/2016 0631  08/29/2016 2011 08/14/16 1134 08/15/16 0250 08/15/16 0847 08/15/16 1109  LATICACIDVEN 11.5*  < > 12.5*  --   --  3.5* 3.4*  PROCALCITON 3.08  --   --  >150.00 >150.00  --   --   < > = values in this interval not displayed.  ABG  Recent Labs Lab 08/15/16 1802 08/16/16 0323 08/17/16 0418  PHART 7.486* 7.416 7.478*  PCO2ART 26.5* 29.9* 28.3*  PO2ART 114.0* 129* 156*    Liver Enzymes  Recent Labs Lab 08/26/2016 1653 08/14/16 0358 08/15/16 1100 08/16/16 1613 08/17/16 0357  AST 732* 1,901* 1,155*  --   --   ALT 265* 643* 668*  --   --   ALKPHOS 58 63 62  --   --   BILITOT 0.6 1.0 3.3*  --   --   ALBUMIN 1.8* 2.0* 2.5* 2.3* 2.3*    Cardiac Enzymes  Recent  Labs Lab 08/12/2016 1653 08/31/2016 1820 08/14/16 0358  TROPONINI >65.00* >65.00* >65.00*    Glucose  Recent Labs Lab 08/16/16 0304 08/16/16 0748 08/16/16 1231 08/16/16 1556 08/16/16 2359 08/17/16 0357  GLUCAP 102* 92 105* 110* 108* 111*    Imaging Dg Chest Port 1 View  Result Date: 08/17/2016 CLINICAL DATA:  Endotracheal tube EXAM: PORTABLE CHEST 1 VIEW COMPARISON:  08/16/2016 FINDINGS: Stable cardiopericardial enlargement. Bilateral IJ line with tips at the upper cavoatrial junction. Endotracheal tube tip ends at the clavicular heads. An orogastric tube reaches the stomach. Stable cardiopericardial enlargement. Mildly improved aeration, likely diminished edema. No visible effusion or pneumothorax. IMPRESSION: 1. Unremarkable positioning of tubes and lines. 2. Mildly improved aeration.  Stable cardiopericardial enlargement. Electronically Signed   By: Monte Fantasia M.D.   On: 08/17/2016 08:35   Dg Chest Port 1 View  Result Date: 08/16/2016 CLINICAL DATA:  Left hemodialysis catheter insertion. EXAM: PORTABLE CHEST 1 VIEW COMPARISON:  08/15/2016 FINDINGS: 1435 hours. Endotracheal tube tip is 3.2 cm above the base of the carina. The NG tube passes into the stomach although the distal tip position is not included on the film. Right IJ central line tip overlies the distal SVC level, near the junction with the RA. Left pulmonary artery catheter has been removed in the interval and new left IJ central line is visualized with the tip overlying the SVC/ RA junction, potentially just into the upper right atrium. No evidence for pneumothorax on either side. Cardiopericardial silhouette is enlarged. Interval development of bibasilar atelectasis without appreciable pleural effusion. Telemetry leads overlie the chest. IMPRESSION: 1. New left IJ central line tip is at or just below the SVC/RA junction. 2. No pneumothorax. 3. Interval progression of bibasilar atelectasis. 4. Cardiopericardial silhouette  remains enlarged. Electronically Signed   By: Misty Stanley M.D.   On: 08/16/2016 14:43     STUDIES:  CTA Chest 05/02/16 >> Cardiomegaly with trace pericardial effusion for age. There is mild diffuse anasarca. Nonspecific retroclavicular, subpectoral, and bilateral axillary lymphadenopathy. Findings have been chronic unstable back to 2016 CT. No acute pulmonary embolus. Small hiatal hernia with fluid in the distal esophagus likely related to reflux.Scattered pulmonary blebs and bulla. No pneumonic consolidation, dominant mass, effusion or pneumothorax. CXR 7/9 >> Endotracheal tube tip approximately 2.3 cm superior to the carina. Moderate to marked cardiomegaly with globular cardiac configuration, consider pericardial effusion Development of  hazy bilateral pulmonary opacity suspicious for vascular congestion and pulmonary edema CT Head 7/9 >> No acute process CTA Chest / ABD 7/9 >> stable cardiomegaly, large pericardial effusion (increased since 04/2016), no evidenceof aortic dissection or aneurysm, no PE.  Vascular congestion and pulmonary edema, scattered cystic changes of the lung, heterogeneous enhancement of the kidneys concerning for pyelonephritis, diffusely dilated small bowel concerning for ileus, diffuse subcutaneous edema / anasarca UDS 7/9 >> positive THC Echo 7/9 > LVEF 20-25%, mild LVH, mild LAE, moderate pericardial effusion CT head 7/10 > nonacute  CULTURES: UA 7/9 >>  resp culture 7/9 >>  ANTIBIOTICS: Rocephin 7/9 >> 7/9 Cefepime 7/9 >> Vanc 7/9 >>7/12  SIGNIFICANT EVENTS: 7/08  Presents to ED with HTN > arrested in ER 7/9 Cardiac arrest x4, moved to [facility] severe cardiomyopathy/acidosis  LINES/TUBES: ETT 7/9 >>  R IJ TLC 7/9 >>   AUTOIMMUNE 7/9  C3 >> normal C4 >> normal GBM >>> SSA - greater than 8 SSB > 5.1 DSDNA >>  1 Anti-scleroderma >> < 0.2 ANCA >> <3.5 ACE >> 33 RA >> normal CRP >> 2.6 ESR >> 20 Phosphatidylserine antibodies >>normal  DISCUSSION: 31  y/o female with underlying autoimmune hemolytic anemia presented on 7/9 with extremity pain and severe hypertension; when treated with labetalol developed cardiac arrest.  Noted to have a severe cardiomyopathy on exam, active urinary sediment.  Family notes severe pulmonary hypertension for weeks and an evolving rash over neck/chest.  There is concern for an underlying connective tissue disease.  Unifying diagnosis at this point is not certain.   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 Cardiogenic shock > 7/11 cardiac index 4.7, CVP 9, Wedge 5 Pericardial effusion  P:  Tele MAP goal 70 mmHg Norepi down  To 6 mcg and milrinone at 0.5 drips Heart failure following Continue solumedrol as above Follow up autoimmune labs, doubt helpful at this stage, positive for ANA and Sjogrin syndrome  RENAL A:   AKI> anuric Metabolic acidosis> lactic, renal failure Renal enhancement on CT angiogram> possibly consistent with medical renal disease like glomerulonephritis Hypokalemia Hypocalcemia Concern for glomerulonephritis > active urinary sediment on U/A P:   Renal dose of medications Replace electrolytes as indicated CRRT to even since CVP is 4 BMP in AM  GASTROINTESTINAL A:   Weight loss in last year Ileus on CT GI bleeding event overnight P:   Holding tube feed with suspected ileus.  Continue PPI > via tube Need to consider TPN if family continues to wish for full support Will call GI if to continue full support, doubt they can do much INR now, address if elevated  HEMATOLOGIC A:   Hemolytic Anemia s/p Splenectomy  -CT A/P in 2015 showed adenopathy E inguinal, external  iliac -CT CAP in 2016 showed axilla, supraclavicular and subpectoral adenopathy > progressive since 2011 -Left Axially Lymph node biopsy in 2016 > 2 Benign lymph nodes, no malignancy  P:  Monitor for bleeding CBC daily Transfusion for Hgb <7gm/dL See plan above  INFECTIOUS A:   Pyelonephritis? Doubt Aspiration pneumonia? doubt P:   Add blood cultures Empirically cover with broad antibiotics for now, cefepime (vanc d/ced due to renal function).  ENDOCRINE A:   Hyperglycemia  P:   SSI  NEUROLOGIC A:   Acute encephalopathy post arrest> high likelihood of anoxic injury Seizures THC use P:   RASS goal -1 to -2 Patient has not received sedation since 7/10 Keppra once, further dosing if indicated per neurology Ativan PRN seizure, has not been needed Continuous EEG ongoing. CT of the head with no evidence of anoxia, will order MRI today.  FAMILY  - Updates: Family to arrive today at 65 AM for family meeting regarding plan of care  - Inter-disciplinary family meet or Palliative Care meeting due by:  08/20/2016  The patient is critically ill with multiple organ systems failure and requires high complexity decision making for assessment and support, frequent evaluation and titration of therapies, application of advanced monitoring technologies and extensive interpretation of multiple databases.   Critical Care Time devoted to patient care services described in this note is  35  Minutes. This time reflects time of care of this signee Dr Jennet Maduro. This critical care time does not reflect procedure time, or teaching time or supervisory time of PA/NP/Med student/Med Resident etc but could involve care discussion time.  Rush Farmer, M.D. [facility] Pulmonary/Critical Care Medicine. Pager: [phone]. After hours pager: [phone].  08/17/2016 9:01 AM

## 2016-08-17 NOTE — Progress Notes (Signed)
CRITICAL VALUE ALERT  Critical Value:  Calcium 5.8  Date & Time Notied:  0613  Provider Notified: Dr. Elsworth Soho  Orders Received/Actions taken: No new orders given

## 2016-08-17 NOTE — Progress Notes (Addendum)
Subjective: Intubated and not following commands. Decorticate posturing.   Exam: Vitals:   08/17/16 0915 08/17/16 0930  BP:  (!) 116/49  Pulse: (!) 112 (!) 116  Resp: 18 (!) 23  Temp: 98.2 F (36.8 C) 98.4 F (36.9 C)    HEENT-  Normocephalic, no lesions, without obvious abnormality.  Normal external eye and conjunctiva.  Normal TM's bilaterally.  Normal auditory canals and external ears. Normal external nose, mucus membranes and septum.  Normal pharynx. Cardiovascular- S1, S2 normal, pulses palpable throughout   Lungs- chest clear, no wheezing, rales, normal symmetric air entry    Neurologic Exam: Mental Status: Patient does not respond to verbal stimuli. Does not respond to deep sternal rub. Does not follow commands. No verbalizations noted.  Cranial Nerves: II: patient does not respond confrontation bilaterally, pupils right 26mm, left 30mm,and reactivebilaterally III,IV,VI: doll's response presentbilaterally. V,VII: corneal reflex present bilaterally VIII: patient does not respond to verbal stimuli IX,X: gag reflex present, XI: trapezius strength unable to test bilaterally XII: tongue strength unable to test Motor: Extremities flaccid throughout. decorticate posturing Sensory: Does not respond to noxious stimuli in any extremity. Deep Tendon Reflexes:  Absent throughout. Plantars: equivocalbilaterally Cerebellar: Unable to perform     Pertinent Labs/Diagnostics: MRI brain to be performed to evaluate for structural damage     Impression and Recommendations: 31 year old female with focal seizures following cardiac arrest. Suspect underlying autoimmune etiology of cardiac arrest given her multisystem involvement, skin rash etc. EEG for last 24h showed no sz.  Impression: Hypoxic/anoxic brain injury Seizures Status epilepticus - resolved  Rec: - MRI brain to evaluate for anoxic brain damage or structural damage -Keppra at 500 twice a day -  family meeting with PCCM at 1000 this AM  Etta Quill PA-C [facility] [phone]  08/17/2016, 10:00 AM NEUROHOSPITALIST ADDENDUM Patient seen and examined at bedside. Continues to have poor exam with only brainstem reflexes and extensor posturing to nox stim in RUE. No withdrawal or movement in LE. At this time, given that she is 72h or more after her multiple cardiac arrests, and poor exam off of any sedation, she has a grim prognosis in terms of chances for meaningful neurological recovery. MRI brain would be helpful to evaluate for extent of anoxic/hypoxic injury. Continue with Keppra 500 BID since there was reported seizure activity, although her EEG did not show any seizure activity and only indicated encephalopathy. Please call with questions.  Amie Portland, MD [facility] [phone]  If 7pm to 7am, please call on call as listed on AMION.

## 2016-08-17 NOTE — Progress Notes (Signed)
Pharmacy Antibiotic Note Portia Wisdom is a 31 y.o. female admitted on 08/16/2016 s/p cardiac arrest several lines placed during code.   Started on CRRT on 7/12. BFR is at 253ml/min. VR this am is 19. WBC down to 26.6.  Plan: Start vancomycin 750mg  IV Q24h at 1200 today Continue cefepime 2g IV Q12h Monitor clinical picture, CRRT, VT prn F/U abx deescalation / LOT?   Height: 5\' 6"  (167.6 cm) Weight: 166 lb 14.2 oz (75.7 kg) IBW/kg (Calculated) : 59.3  Temp (24hrs), Avg:97.8 F (36.6 C), Min:96.6 F (35.9 C), Max:99.3 F (37.4 C)   Recent Labs Lab 08/28/2016 1000 08/28/2016 1653  08/22/2016 2011  08/14/16 0358  08/15/16 0250 08/15/16 0847 08/15/16 1109 08/15/16 1447 08/15/16 2115 08/16/16 0303 08/16/16 1613 08/16/16 1832 08/17/16 0357 08/17/16 0358 08/17/16 0409  WBC  --  21.8*  --   --   --  24.0*  --  34.1*  --   --   --   --  41.3*  --  29.9*  --   --  26.6*  CREATININE  --  2.23*  < >  --   < > 2.95*  < > 3.47*  --   --  3.76*  --  4.09* 4.28*  --  2.98*  --   --   LATICACIDVEN 9.0* 17.1*  --  12.5*  --   --   --   --  3.5* 3.4*  --   --   --   --   --   --   --   --   VANCORANDOM  --   --   --   --   --   --   < >  --   --   --   --  35  --   --   --   --  19  --   < > = values in this interval not displayed.  Estimated Creatinine Clearance: 27.9 mL/min (A) (by C-G formula based on SCr of 2.98 mg/dL (H)).    No Known Allergies  Antimicrobials this admission: Vancomycin 7/9 >>  Cefepime  7/9 >>   Dose adjustments this admission: 7/10: VR 19. Ke 0.0324 (t1/2 21 hours). Est trough (24 hours from last dose) = 17.5. 750 q24h provides est peak of 32 and trough of 1 7/13 VR = 19 (start 750mg  IV Q24h now on CRRT)  Microbiology results: 7/9 MRSA PCR: Negative  Elenor Quinones, PharmD, BCPS Clinical Pharmacist Pager [phone] 08/17/2016 5:32 AM

## 2016-08-17 NOTE — Consult Note (Signed)
Referring Provider:  Dr. Nelda Marseille Primary Care Physician:  Merrilee Seashore, MD Primary Gastroenterologist:  Althia Forts  Reason for Consultation:  GI bleed  HPI: Casey Chapman is a 31 y.o. female with past medical history of autoimmune hemolytic anemia status post splenectomy followed by hematology sent to [facility] on 08/27/2016 with hypertension and numbness in the proximal extremity. Patient subsequently had multiple cardiac arrest requiring CPR. Patient currently remains intubated. CT scan on 08/09/2016 showed multiple dilated loops of small bowel up to 4.5 cm in diameter finding concerning for ileus versus possible distal small bowel obstruction. Had 1 episode of bleeding yesterday. GI is consulted for further evaluation.   Patient remains intubated. History obtained from chart review. Neurology notes reviewed. Patient with possible anoxic brain injury and based on neurology notes she has a grim prognosis of any chance of meaningful neurological recovery.  Discussed with the nursing staff. Patient had possible bright blood in the rectal collection system yesterday. She is currently having dark green liquid stool into the fecal  management system. Discussed with family members as well. They have decided for DO NOT RESUSCITATE and they do not want any invasive procedures.  Past Medical History:  Diagnosis Date  . Chlamydia 04/16/12   Treated   . Eczema   . Hemolytic anemia ([place]) 2012   Autoimmune     Past Surgical History:  Procedure Laterality Date  . SPLENECTOMY  2012   . TONSILLECTOMY  1990    Prior to Admission medications   Medication Sig Start Date End Date Taking? Authorizing Provider  acetaminophen (TYLENOL) 500 MG tablet Take 500-1,000 mg by mouth every 6 (six) hours as needed for mild pain, moderate pain, fever or headache. pain    Yes [provider]  furosemide (LASIX) 20 MG tablet Take 1 tablet (20 mg total) by mouth daily. Patient taking differently:  Take 20 mg by mouth daily as needed for edema.  05/02/16  Yes Forde Dandy, MD  hydrocortisone cream 0.5 % Apply 1 application topically 2 (two) times daily as needed (for itching on arms/legs).    Yes [provider]  ibuprofen (ADVIL,MOTRIN) 200 MG tablet Take 400-800 mg by mouth every 6 (six) hours as needed for fever, headache, mild pain, moderate pain or cramping.    Yes [provider]    Scheduled Meds: . chlorhexidine gluconate (MEDLINE KIT)  15 mL Mouth Rinse BID  . Chlorhexidine Gluconate Cloth  6 each Topical Daily  . insulin aspart  0-20 Units Subcutaneous Q4H  . mouth rinse  15 mL Mouth Rinse Q2H  . methylPREDNISolone (SOLU-MEDROL) injection  125 mg Intravenous Q6H  . mupirocin ointment  1 application Nasal BID  . pantoprazole (PROTONIX) IV  40 mg Intravenous Q24H  . sodium chloride flush  10-40 mL Intracatheter Q12H   Continuous Infusions: . sodium chloride    . sodium chloride 10 mL/hr at 08/17/16 1100  . sodium chloride    . sodium chloride    . sodium chloride    . amiodarone 30 mg/hr (08/17/16 1100)  . ceFEPime (MAXIPIME) IV Stopped (08/17/16 1041)  . epinephrine Stopped (08/16/16 0200)  . fentaNYL infusion INTRAVENOUS Stopped (08/14/16 0400)  . levETIRAcetam 500 mg (08/17/16 1201)  . milrinone 0.5 mcg/kg/min (08/17/16 1100)  . norepinephrine (LEVOPHED) Adult infusion 5.973 mcg/min (08/17/16 1100)  . dialysis replacement fluid (prismasate) 500 mL/hr at 08/17/16 0205  . dialysis replacement fluid (prismasate) 200 mL/hr at 08/16/16 1531  . dialysate (PRISMASATE) 1,000 mL/hr at 08/17/16  2542  . sodium chloride    . sodium chloride    . vancomycin     PRN Meds:.sodium chloride, acetaminophen (TYLENOL) oral liquid 160 mg/5 mL, fentaNYL, heparin, LORazepam, sodium chloride, sodium chloride flush  Allergies as of 08/12/2016  . (No Known Allergies)    Family History  Problem Relation Age of Onset  . Diabetes Mother   . Hypertension Mother   .  Depression Mother   . Diabetes Brother   . Diabetes Maternal Grandmother   . Diabetes Paternal Grandmother   . Hypertension Paternal Grandmother   . Thyroid disease Father        Thyroid removed    Social History   Social History  . Marital status: Single    Spouse name: N/A  . Number of children: N/A  . Years of education: N/A   Occupational History  . Not on file.   Social History Main Topics  . Smoking status: Never Smoker  . Smokeless tobacco: Never Used  . Alcohol use 0.0 oz/week     Comment: Socially  . Drug use: No  . Sexual activity: Yes    Partners: Male   Other Topics Concern  . Not on file   Social History Narrative  . No narrative on file    Review of Systems: Not able to obtain  Physical Exam: Vital signs: Vitals:   08/17/16 1000 08/17/16 1015  BP: (!) 115/55   Pulse: (!) 135 (!) 135  Resp: (!) 22 (!) 21  Temp: 98.6 F (37 C) 98.1 F (36.7 C)   Last BM Date: 08/16/16 General:   Patient currently on ventilation and unresponsive. Lungs:  Coarse breath sounds bilaterally. Heart:  Regular rate and rhythm; no murmurs, clicks, rubs,  or gallops. Abdomen: Mildly distended, not able to appreciate tenderness, bowel sounds present. No peritoneal signs. LE: trace edema.    GI:  Lab Results:  Recent Labs  08/16/16 0303 08/16/16 1832 08/17/16 0100 08/17/16 0409 08/17/16 0937  WBC 41.3* 29.9*  --  26.6*  --   HGB 7.8* 6.0* 7.8* 7.8* 7.3*  HCT 22.5* 17.1* 22.7* 22.6* 21.1*  PLT 46* 68*  --  53*  --    BMET  Recent Labs  08/16/16 0303 08/16/16 1613 08/17/16 0357  NA 131* 130* 133*  K 5.6* 5.5* 4.2  CL 98* 97* 100*  CO2 18* 18* 19*  GLUCOSE 100* 108* 99  BUN 65* 69* 52*  CREATININE 4.09* 4.28* 2.98*  CALCIUM 5.7* 5.5* 5.8*   LFT  Recent Labs  08/15/16 1100  08/17/16 0357  PROT 4.9*  --   --   ALBUMIN 2.5*  < > 2.3*  AST 1,155*  --   --   ALT 668*  --   --   ALKPHOS 62  --   --   BILITOT 3.3*  --   --   BILIDIR 2.2*  --    --   IBILI 1.1*  --   --   < > = values in this interval not displayed. PT/INR  Recent Labs  08/15/16 1040 08/17/16 0937  LABPROT 26.3* 15.3*  INR 2.37 1.20     Studies/Results: Dg Chest Port 1 View  Result Date: 08/17/2016 CLINICAL DATA:  Endotracheal tube EXAM: PORTABLE CHEST 1 VIEW COMPARISON:  08/16/2016 FINDINGS: Stable cardiopericardial enlargement. Bilateral IJ line with tips at the upper cavoatrial junction. Endotracheal tube tip ends at the clavicular heads. An orogastric tube reaches the stomach. Stable cardiopericardial enlargement. Mildly improved aeration,  likely diminished edema. No visible effusion or pneumothorax. IMPRESSION: 1. Unremarkable positioning of tubes and lines. 2. Mildly improved aeration.  Stable cardiopericardial enlargement. Electronically Signed   By: Monte Fantasia M.D.   On: 08/17/2016 08:35   Dg Chest Port 1 View  Result Date: 08/16/2016 CLINICAL DATA:  Left hemodialysis catheter insertion. EXAM: PORTABLE CHEST 1 VIEW COMPARISON:  08/15/2016 FINDINGS: 1435 hours. Endotracheal tube tip is 3.2 cm above the base of the carina. The NG tube passes into the stomach although the distal tip position is not included on the film. Right IJ central line tip overlies the distal SVC level, near the junction with the RA. Left pulmonary artery catheter has been removed in the interval and new left IJ central line is visualized with the tip overlying the SVC/ RA junction, potentially just into the upper right atrium. No evidence for pneumothorax on either side. Cardiopericardial silhouette is enlarged. Interval development of bibasilar atelectasis without appreciable pleural effusion. Telemetry leads overlie the chest. IMPRESSION: 1. New left IJ central line tip is at or just below the SVC/RA junction. 2. No pneumothorax. 3. Interval progression of bibasilar atelectasis. 4. Cardiopericardial silhouette remains enlarged. Electronically Signed   By: Misty Stanley M.D.   On:  08/16/2016 14:43    Impression/Plan: - Possible lower GI bleed. Seems to be resolving. Currently has  dark green liquid stool in the fecal management system. - Long-standing hemolytic anemia. - anoxic brain injury. - Status post multiple cardiac arrest requiring resuscitation. - Abnormal LFTs. Most likely ischemic injury to liver. - Elevated INR. 2.37 on 08/15/2016. Resolved.  Recommendations ------------------------- - Patient remains unresponsive . currently on ventilation support. Based on neurology notes, patient  has a grim  prognosis for any meaningful neurological recovery. Discussed with the family members regarding etiology and evaluation for GI bleed. They  have decided for DO NOT RESUSCITATE. Also declined any invasive procedures. - Recommend conservative management with transfusion of blood products as needed. - GI will sign off. Call us back if needed    LOS: 4 days   Otis Brace  MD, FACP 08/17/2016, 12:04 PM  Pager [phone] If no answer or after 5 PM call [phone]

## 2016-08-17 NOTE — Progress Notes (Signed)
Pt seen on CVVHD.  Metabolically, Ca remains low, will replace.  Still on levophed though at lower dose, and milrinone.  BP is a little better.  CVP 4.  Will cont to keep even for now.  CXR sl improved.

## 2016-08-18 ENCOUNTER — Inpatient Hospital Stay (HOSPITAL_COMMUNITY): Payer: 59

## 2016-08-18 LAB — BPAM RBC
BLOOD PRODUCT EXPIRATION DATE: 201807142359
ISSUE DATE / TIME: 201807131245
UNIT TYPE AND RH: 8400

## 2016-08-18 LAB — GLUCOSE, CAPILLARY
GLUCOSE-CAPILLARY: 126 mg/dL — AB (ref 65–99)
GLUCOSE-CAPILLARY: 115 mg/dL — AB (ref 65–99)
GLUCOSE-CAPILLARY: 107 mg/dL — AB (ref 65–99)
GLUCOSE-CAPILLARY: 109 mg/dL — AB (ref 65–99)
Glucose-Capillary: 114 mg/dL — ABNORMAL HIGH (ref 65–99)

## 2016-08-18 LAB — CBC
HEMATOCRIT: 26 % — AB (ref 36.0–46.0)
Hemoglobin: 9.1 g/dL — ABNORMAL LOW (ref 12.0–15.0)
MCH: 27.8 pg (ref 26.0–34.0)
MCHC: 35 g/dL (ref 30.0–36.0)
MCV: 79.5 fL (ref 78.0–100.0)
Platelets: 56 10*3/uL — ABNORMAL LOW (ref 150–400)
RBC: 3.27 MIL/uL — ABNORMAL LOW (ref 3.87–5.11)
RDW: 17.6 % — AB (ref 11.5–15.5)
WBC: 36.5 10*3/uL — ABNORMAL HIGH (ref 4.0–10.5)

## 2016-08-18 LAB — TYPE AND SCREEN
ABO/RH(D): AB POS
Antibody Screen: NEGATIVE
Unit division: 0

## 2016-08-18 LAB — RENAL FUNCTION PANEL
Albumin: 2.2 g/dL — ABNORMAL LOW (ref 3.5–5.0)
Albumin: 2.2 g/dL — ABNORMAL LOW (ref 3.5–5.0)
Anion gap: 10 (ref 5–15)
Anion gap: 11 (ref 5–15)
BUN: 35 mg/dL — ABNORMAL HIGH (ref 6–20)
BUN: 29 mg/dL — ABNORMAL HIGH (ref 6–20)
CHLORIDE: 100 mmol/L — AB (ref 101–111)
CHLORIDE: 104 mmol/L (ref 101–111)
CO2: 22 mmol/L (ref 22–32)
CO2: 21 mmol/L — ABNORMAL LOW (ref 22–32)
CREATININE: 2.19 mg/dL — AB (ref 0.44–1.00)
CREATININE: 1.89 mg/dL — AB (ref 0.44–1.00)
Calcium: 6.3 mg/dL — CL (ref 8.9–10.3)
Calcium: 6.8 mg/dL — ABNORMAL LOW (ref 8.9–10.3)
GFR calc non Af Amer: 28 mL/min — ABNORMAL LOW (ref >60)
GFR, EST AFRICAN AMERICAN: 33 mL/min — AB (ref >60)
GFR, EST AFRICAN AMERICAN: 39 mL/min — AB (ref >60)
GFR, EST NON AFRICAN AMERICAN: 34 mL/min — AB (ref >60)
GLUCOSE: 113 mg/dL — AB (ref 65–99)
Glucose, Bld: 132 mg/dL — ABNORMAL HIGH (ref 65–99)
POTASSIUM: 3.5 mmol/L (ref 3.5–5.1)
Phosphorus: 3 mg/dL (ref 2.5–4.6)
Phosphorus: 2.7 mg/dL (ref 2.5–4.6)
Potassium: 3.2 mmol/L — ABNORMAL LOW (ref 3.5–5.1)
Sodium: 132 mmol/L — ABNORMAL LOW (ref 135–145)
Sodium: 136 mmol/L (ref 135–145)

## 2016-08-18 LAB — HEMOGLOBIN AND HEMATOCRIT, BLOOD
HCT: 24.5 % — ABNORMAL LOW (ref 36.0–46.0)
HEMATOCRIT: 24.5 % — AB (ref 36.0–46.0)
HEMATOCRIT: 24.9 % — AB (ref 36.0–46.0)
HEMOGLOBIN: 8.3 g/dL — AB (ref 12.0–15.0)
Hemoglobin: 8.3 g/dL — ABNORMAL LOW (ref 12.0–15.0)
Hemoglobin: 8.4 g/dL — ABNORMAL LOW (ref 12.0–15.0)

## 2016-08-18 LAB — HEPATIC FUNCTION PANEL
ALBUMIN: 2.2 g/dL — AB (ref 3.5–5.0)
ALK PHOS: 188 U/L — AB (ref 38–126)
ALT: 1736 U/L — AB (ref 14–54)
AST: 1160 U/L — ABNORMAL HIGH (ref 15–41)
BILIRUBIN INDIRECT: 1.5 mg/dL — AB (ref 0.3–0.9)
Bilirubin, Direct: 1.8 mg/dL — ABNORMAL HIGH (ref 0.1–0.5)
TOTAL PROTEIN: 5.1 g/dL — AB (ref 6.5–8.1)
Total Bilirubin: 3.3 mg/dL — ABNORMAL HIGH (ref 0.3–1.2)

## 2016-08-18 LAB — BASIC METABOLIC PANEL
Anion gap: 12 (ref 5–15)
BUN: 36 mg/dL — ABNORMAL HIGH (ref 6–20)
CALCIUM: 6.3 mg/dL — AB (ref 8.9–10.3)
CO2: 21 mmol/L — AB (ref 22–32)
CREATININE: 2.22 mg/dL — AB (ref 0.44–1.00)
Chloride: 101 mmol/L (ref 101–111)
GFR calc Af Amer: 32 mL/min — ABNORMAL LOW (ref >60)
GFR calc non Af Amer: 28 mL/min — ABNORMAL LOW (ref >60)
GLUCOSE: 115 mg/dL — AB (ref 65–99)
Potassium: 3.2 mmol/L — ABNORMAL LOW (ref 3.5–5.1)
Sodium: 134 mmol/L — ABNORMAL LOW (ref 135–145)

## 2016-08-18 LAB — COOXEMETRY PANEL
Carboxyhemoglobin: 1.3 % (ref 0.5–1.5)
METHEMOGLOBIN: 1.7 % — AB (ref 0.0–1.5)
O2 SAT: 78.4 %
TOTAL HEMOGLOBIN: 8.8 g/dL — AB (ref 12.0–16.0)

## 2016-08-18 LAB — POCT I-STAT 3, ART BLOOD GAS (G3+)
Acid-base deficit: 1 mmol/L (ref 0.0–2.0)
Bicarbonate: 22.9 mmol/L (ref 20.0–28.0)
O2 Saturation: 92 %
PCO2 ART: 33.8 mmHg (ref 32.0–48.0)
PH ART: 7.437 (ref 7.350–7.450)
TCO2: 24 mmol/L (ref 0–100)
pO2, Arterial: 59 mmHg — ABNORMAL LOW (ref 83.0–108.0)

## 2016-08-18 LAB — MAGNESIUM: Magnesium: 2.3 mg/dL (ref 1.7–2.4)

## 2016-08-18 LAB — PHOSPHORUS: Phosphorus: 3 mg/dL (ref 2.5–4.6)

## 2016-08-18 MED ORDER — CHLORHEXIDINE GLUCONATE CLOTH 2 % EX PADS
6.0000 | MEDICATED_PAD | Freq: Every day | CUTANEOUS | Status: DC
  Administered 2016-08-19: 6 via TOPICAL

## 2016-08-18 MED ORDER — PRISMASOL BGK 4/2.5 32-4-2.5 MEQ/L IV SOLN
INTRAVENOUS | Status: DC
  Administered 2016-08-18 – 2016-08-19 (×3): via INTRAVENOUS_CENTRAL
  Filled 2016-08-18 (×4): qty 5000

## 2016-08-18 MED ORDER — POTASSIUM CHLORIDE 10 MEQ/50ML IV SOLN
INTRAVENOUS | Status: AC
  Administered 2016-08-18: 10 meq via INTRAVENOUS
  Filled 2016-08-18: qty 150

## 2016-08-18 MED ORDER — POTASSIUM CHLORIDE 10 MEQ/50ML IV SOLN
10.0000 meq | INTRAVENOUS | Status: AC
  Administered 2016-08-18 (×3): 10 meq via INTRAVENOUS

## 2016-08-18 MED ORDER — SODIUM CHLORIDE 0.9 % IV SOLN
1.0000 g | Freq: Once | INTRAVENOUS | Status: AC
  Administered 2016-08-18: 1 g via INTRAVENOUS
  Filled 2016-08-18: qty 10

## 2016-08-18 MED ORDER — FENTANYL CITRATE (PF) 100 MCG/2ML IJ SOLN
100.0000 ug | INTRAMUSCULAR | Status: DC | PRN
  Administered 2016-08-18 (×3): 100 ug via INTRAVENOUS
  Filled 2016-08-18 (×3): qty 2

## 2016-08-18 MED ORDER — PRISMASOL BGK 4/2.5 32-4-2.5 MEQ/L IV SOLN
INTRAVENOUS | Status: DC
  Administered 2016-08-18 – 2016-08-19 (×2): via INTRAVENOUS_CENTRAL
  Filled 2016-08-18 (×2): qty 5000

## 2016-08-18 NOTE — Progress Notes (Signed)
PULMONARY / CRITICAL CARE MEDICINE   Name: Casey Chapman MRN: [id number] DOB: 12/15/1985    ADMISSION DATE:  09/02/2016 CONSULTATION DATE:  08/14/2016  REFERRING MD:  Dr. Randal Buba   CHIEF COMPLAINT:  HTN   BRIEF SUMMARY:   31 year old female with PMH of autoimmune hemolytic anemia s/p splenectomy, being followed by Dr. Burr Medico in Hematology. Has reported 60 lb weight loss in past 2 years with history of weakness and swelling in lower extremity.  Presented with pain in hands, feet, marked hypertension to the [facility] ER on 7/9.  Family noted development of a rash around chest/neck over the last year and progressive hypertension.  In the [facility] ER she was very hypertensive which was treated with labetalol, not long afterwards she had a cardiac arrest.  She was moved to [facility] later in the day with severe cardiogenic shock, unexplained airspace disease and recurrent episodes of cardiac arrest.  On 7/9 she had at least 4 episodes of cardiac arrest requiring CPR for approximately 30-45 minutes in total.  Echo on 7/9 showed a moderate pericardial effusion, LVEF 20-25%, RV function severely reduced.   SUBJECTIVE:   MRI results reviewed. Spoke w/ mother.   VITAL SIGNS: BP 131/64   Pulse (!) 108   Temp 97.7 F (36.5 C)   Resp 17   Ht '5\' 6"'$  (1.676 m)   Wt 158 lb 15.2 oz (72.1 kg)   LMP 08/09/2016 (Exact Date) Comment: negative beta HCG 08/05/2016  SpO2 94%   BMI 25.66 kg/m   HEMODYNAMICS: CVP:  [1 mmHg-8 mmHg] 6 mmHg  VENTILATOR SETTINGS: Vent Mode: PRVC FiO2 (%):  [40 %-60 %] 60 % Set Rate:  [16 bmp] 16 bmp Vt Set:  [470 mL] 470 mL PEEP:  [5 cmH20] 5 cmH20 Plateau Pressure:  [14 cmH20-27 cmH20] 14 cmH20  INTAKE / OUTPUT: I/O last 3 completed shifts: In: 3655.8 [I.V.:1479.8; Blood:690; Other:386; NG/GT:80; IV Piggyback:1020] Out: 0109 [Other:2324; Stool:1025]  PHYSICAL EXAMINATION: Physical Exam  Constitutional: She has a sickly appearance. She appears ill. She is sedated and intubated.  HENT:   Head: Normocephalic and atraumatic.  Eyes: Conjunctivae are normal. No scleral icterus. Right eye exhibits nystagmus.  Neck: No JVD present.  Cardiovascular: Regular rhythm.  Exam reveals decreased pulses.   Murmur heard.  Systolic murmur is present with a grade of 3/6  Pulmonary/Chest: Effort normal. She is intubated. No respiratory distress. She has decreased breath sounds.  Abdominal: Soft. Normal appearance and bowel sounds are normal. There is tenderness.  Neurological: She is unresponsive. GCS eye subscore is 4. GCS verbal subscore is 1. GCS motor subscore is 1.  Skin: Skin is warm and intact. She is not diaphoretic.  Skin is tight Diffuse anasarca     LABS:  BMET  Recent Labs Lab 08/17/16 1649 08/18/16 0400 08/18/16 0628  NA 136 134* 132*  K 3.6 3.2* 3.2*  CL 102 101 100*  CO2 22 21* 22  BUN 39* 36* 35*  CREATININE 2.36* 2.22* 2.19*  GLUCOSE 122* 115* 113*    Electrolytes  Recent Labs Lab 08/14/16 1533  08/17/16 0409 08/17/16 1649 08/18/16 0400 08/18/16 0628  CALCIUM 5.7*  < >  --  6.3* 6.3* 6.3*  MG 2.1  --  2.1  --  2.3  --   PHOS  --   < >  --  3.4 3.0 3.0  < > = values in this interval not displayed.  CBC  Recent Labs Lab 08/16/16 1832  08/17/16 0409  08/17/16  1649 08/17/16 2332 08/18/16 0400  WBC 29.9*  --  26.6*  --   --   --  36.5*  HGB 6.0*  < > 7.8*  < > 9.1* 8.7* 9.1*  HCT 17.1*  < > 22.6*  < > 26.6* 25.4* 26.0*  PLT 68*  --  53*  --   --   --  56*  < > = values in this interval not displayed.  Coag's  Recent Labs Lab 08/21/2016 1653 08/15/16 1040 08/17/16 0937  INR 2.90 2.37 1.20    Sepsis Markers  Recent Labs Lab 08/06/2016 0631  08/29/2016 2011 08/14/16 1134 08/15/16 0250 08/15/16 0847 08/15/16 1109  LATICACIDVEN 11.5*  < > 12.5*  --   --  3.5* 3.4*  PROCALCITON 3.08  --   --  >150.00 >150.00  --   --   < > = values in this interval not displayed.  ABG  Recent Labs Lab 08/15/16 1802 08/16/16 0323 08/17/16 0418   PHART 7.486* 7.416 7.478*  PCO2ART 26.5* 29.9* 28.3*  PO2ART 114.0* 129* 156*    Liver Enzymes  Recent Labs Lab 08/14/16 0358 08/15/16 1100  08/17/16 1649 08/18/16 0400 08/18/16 0628  AST 1,901* 1,155*  --   --  1,160*  --   ALT 643* 668*  --   --  1,736*  --   ALKPHOS 63 62  --   --  188*  --   BILITOT 1.0 3.3*  --   --  3.3*  --   ALBUMIN 2.0* 2.5*  < > 2.3* 2.2* 2.2*  < > = values in this interval not displayed.  Cardiac Enzymes  Recent Labs Lab 08/11/2016 1653 08/30/2016 1820 08/14/16 0358  TROPONINI >65.00* >65.00* >65.00*    Glucose  Recent Labs Lab 08/17/16 1127 08/17/16 1655 08/17/16 2024 08/17/16 2345 08/18/16 0359 08/18/16 0756  GLUCAP 112* 126* 125* 123* 114* 115*    Imaging Mr Brain Wo Contrast  Result Date: 08/18/2016 CLINICAL DATA:  Multiple episodes of cardiac arrest. History of hemolytic anemia and hypertension. Assess anoxic brain injury. EXAM: MRI HEAD WITHOUT CONTRAST TECHNIQUE: Multiplanar, multiecho pulse sequences of the brain and surrounding structures were obtained without intravenous contrast. FINDINGS: BRAIN: Faint reduced diffusion cerebellar dentate nuclei, hippocampi, basal ganglia and bowel micro, cerebral cortices, with relative sparing of the temporal lobe. Corresponding low ADC values and FLAIR T2 hyperintense signal. No susceptibility artifact to suggest hemorrhage. Ventricles and sulci are normal for patient's age. No midline shift, mass effect or masses. No abnormal extra-axial fluid collections. VASCULAR: Normal major intracranial vascular flow voids present at skull base. SKULL AND UPPER CERVICAL SPINE: No abnormal sellar expansion. No suspicious calvarial bone marrow signal. Craniocervical junction maintained. Mild cerebellar tonsillar ectopia. SINUSES/ORBITS: Small LEFT maxillary mucosal retention cyst. Bilateral mastoid effusions. The included ocular globes and orbital contents are non-suspicious. OTHER: Numerous small cysts in the  included parotid gland seen with autoimmune disease. IMPRESSION: Severe hypoxic ischemic injury. Electronically Signed   By: Elon Alas M.D.   On: 08/18/2016 02:55   Dg Chest Port 1 View  Result Date: 08/18/2016 CLINICAL DATA:  Anoxic brain injury.  Follow-up exam. EXAM: PORTABLE CHEST 1 VIEW COMPARISON:  08/17/2016 FINDINGS: There is no opacity at the right lung base obscuring the right heart border on the right hemidiaphragm. This is consistent with right lower lobe and right middle lobe atelectasis. There is hazy opacity at the left lung base obscuring the left hemidiaphragm, increased from the previous exam, consistent with pleural  fluid and atelectasis. No convincing pulmonary edema.  No pneumothorax. Endotracheal tube tip projects 3.4 cm above the carina. Left internal jugular dual-lumen central venous catheter tip projects in the lower superior vena cava as does the right internal jugular central venous line. Nasogastric tube passes below the diaphragm and below the included field of view. IMPRESSION: 1. Worsened lung aeration when compared to the prior study. This is most likely due to atelectasis, which is most prominent at the right lung base, with associated pleural effusions. 2. No pulmonary edema.  No pneumothorax. 3. Support apparatus is well positioned. Electronically Signed   By: Amie Portland M.D.   On: 08/18/2016 07:44     STUDIES:  CTA Chest 05/02/16 >> Cardiomegaly with trace pericardial effusion for age. There is mild diffuse anasarca. Nonspecific retroclavicular, subpectoral, and bilateral axillary lymphadenopathy. Findings have been chronic unstable back to 2016 CT. No acute pulmonary embolus. Small hiatal hernia with fluid in the distal esophagus likely related to reflux.Scattered pulmonary blebs and bulla. No pneumonic consolidation, dominant mass, effusion or pneumothorax. CXR 7/9 >> Endotracheal tube tip approximately 2.3 cm superior to the carina. Moderate to marked  cardiomegaly with globular cardiac configuration, consider pericardial effusion Development of hazy bilateral pulmonary opacity suspicious for vascular congestion and pulmonary edema CT Head 7/9 >> No acute process CTA Chest / ABD 7/9 >> stable cardiomegaly, large pericardial effusion (increased since 04/2016), no evidenceof aortic dissection or aneurysm, no PE.  Vascular congestion and pulmonary edema, scattered cystic changes of the lung, heterogeneous enhancement of the kidneys concerning for pyelonephritis, diffusely dilated small bowel concerning for ileus, diffuse subcutaneous edema / anasarca UDS 7/9 >> positive THC Echo 7/9 > LVEF 20-25%, mild LVH, mild LAE, moderate pericardial effusion CT head 7/10 > nonacute  CULTURES: UA 7/9 >>  resp culture 7/9 >>neg  ANTIBIOTICS: Rocephin 7/9 >> 7/9 Cefepime 7/9 >> Vanc 7/9 >>7/12  SIGNIFICANT EVENTS: 7/08  Presents to ED with HTN > arrested in ER 7/9 Cardiac arrest x4, moved to [facility] severe cardiomyopathy/acidosis 7/14 family made limited code   LINES/TUBES: ETT 7/9 >>  R IJ TLC 7/9 >>   AUTOIMMUNE 7/9  C3 >> normal C4 >> normal GBM >>> SSA - greater than 8 SSB > 5.1 DSDNA >>  1 Anti-scleroderma >> < 0.2 ANCA >> <3.5 ACE >> 33 RA >> normal CRP >> 2.6 ESR >> 20 Phosphatidylserine antibodies >>normal  DISCUSSION: 31 y/o female with underlying autoimmune hemolytic anemia presented on 7/9 with extremity pain and severe hypertension; when treated with labetalol developed cardiac arrest.  Noted to have a severe cardiomyopathy on exam, active urinary sediment.  Family notes severe pulmonary hypertension for weeks and an evolving rash over neck/chest.  There is concern for an underlying connective tissue disease.  Unifying diagnosis at this point is not certain.  ->MRI 7/14 c/w severe anoxic injury  ->family discussing w/d of care  ASSESSMENT / PLAN:  PULMONARY A: Acute respiratory failure with hypoxemia Acute pneumonitis vs  pulm edema in setting of arrest ->PCXR personally reviewed new right ML and RLL atx/consolidation/collapse. 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 Cardiogenic shock > 7/11 cardiac index 4.7, CVP 9, Wedge 5 Pericardial effusion SVT P:  Cont tele  MAP goal > 70  Cont levophed milrinone  Cont steroids (+ana and sjogrin syndrome)  RENAL  A:   AKI> anuric Metabolic acidosis> lactic, renal failure Renal enhancement on CT angiogram> possibly consistent with medical renal disease like glomerulonephritis Hypokalemia Hypocalcemia Concern for glomerulonephritis > active urinary sediment on U/A P:   Cont CRRT Replace lytes as indicated Aiming for volume removal today  GASTROINTESTINAL A:   Weight loss in last year Ileus on CT GI bleeding event overnight P:   Cont PPI tubefeeds   HEMATOLOGIC A:   Hemolytic Anemia s/p Splenectomy  -CT A/P in 2015 showed adenopathy E inguinal, external iliac -CT CAP in 2016 showed axilla, supraclavicular and subpectoral adenopathy > progressive since 2011 -Left Axially Lymph node biopsy in 2016 > 2 Benign lymph nodes, no malignancy  P:  Cbc PRN Given futility we will not transfuse PPI  Infectious disease. A:   Pyelonephritis? Doubt Aspiration pneumonia? Doubt Afebrile (but on CRRT) Wbc climbing but on steroids. all culture data negative   P:   Cont day 6/7 cefepime (will dc if transition to comfort)  ENDOCRINE A:   Hyperglycemia  P:   ssi   NEUROLOGIC A:   Acute encephalopathy post arrest-> MRI 7/14 c/w severe anoxic injury  Seizures THC use Chances of meaningful neurological recovery is poor.  P:   Cont PAD protocol goal RAS 0 to -1 Keppra PRN benzo for seizure   FAMILY  - Updates: Family to arrive today at 64 AM for family  meeting regarding plan of care Discussed MRI results and prognosis w/ mother. She does not want her daughter to suffer. She is reaching out to the patient's brothers as well as the patient's father. I have offered to speak w/ them if they desire. Anticipate we will be moving towards one-way extubation and transition to palliative care in next 24-48 hours  My cct 50 minutes  Erick Colace ACNP-BC [place] Pager # [phone] OR # [phone] if no answer  08/18/2016 8:19 AM

## 2016-08-18 NOTE — Progress Notes (Signed)
10 cc aspirated and wasted from each lumen of HD cath prior to restart of CRRT.

## 2016-08-18 NOTE — Progress Notes (Signed)
Subjective: No new symptoms. No overt events MRI completed overnight  Exam: Vitals:   08/18/16 0500 08/18/16 0600  BP: (!) 145/84 131/64  Pulse: 100 (!) 106  Resp: 19 19  Temp: (!) 97 F (36.1 C) (!) 96.4 F (35.8 C)   Neurological exam Intubated. No sedation. Does not respond to voice or noxious stim. Pupils are equal, round reactive to light. She is breathing above the ventilator. Corneals present bilaterally. Cough and gag present Spontaneous movements. No movement in response to noxious stimulus. Mute deep tendon reflexes. Mute toes  Pertinent Labs: Sodium 132, potassium 3.2, creatinine 2.19, calcium 6.3, GFR 33, WBC 36.5, hemoglobin 9.1, platelet count 56.  Imaging reviewed. MRI brain shows severe hypoxic brain injury. Formal report below FINDINGS: BRAIN: Faint reduced diffusion cerebellar dentate nuclei, hippocampi, basal ganglia and bowel micro, cerebral cortices, with relative sparing of the temporal lobe. Corresponding low ADC values and FLAIR T2 hyperintense signal. No susceptibility artifact to suggest hemorrhage. Ventricles and sulci are normal for patient's age. No midline shift, mass effect or masses. No abnormal extra-axial fluid collections.  VASCULAR: Normal major intracranial vascular flow voids present at skull base.  SKULL AND UPPER CERVICAL SPINE: No abnormal sellar expansion. No suspicious calvarial bone marrow signal. Craniocervical junction maintained. Mild cerebellar tonsillar ectopia.  SINUSES/ORBITS: Small LEFT maxillary mucosal retention cyst. Bilateral mastoid effusions. The included ocular globes and orbital contents are non-suspicious.  OTHER: Numerous small cysts in the included parotid gland seen with autoimmune disease.  IMPRESSION: Severe hypoxic ischemic injury.   Impression:  31 year old woman with new onset seizures and loss of consciousness following cardiac arrest with a long down time. Her examination has remained  poor with only brainstem reflexes being present. Her EEG is diffusely slow with loss of normal physiological patterns. This is consistent with diffuse encephalopathy. MRI brain done today reveals evidence of severe hypoxic brain injury. Multiple medical comorbidities, long down time with the cardiac arrest combined with persistent poor neurological exam and MRI findings of severe hypoxic injury, chances of neurologically meaningful recovery are slim at this point.  Recommendations Continue with Keppra at the current dose Goals of care discussion with family. Management of multiple medical derangements per primary team as you are.  Please call with questions  Amie Portland, MD [facility] [phone]  If 7pm to 7am, please call on call as listed on AMION.

## 2016-08-18 NOTE — Progress Notes (Signed)
HR sustaining in 140's, Dr. Wonda Amis notified. [place], Ardeth Sportsman

## 2016-08-18 NOTE — Plan of Care (Signed)
Problem: Fluid Volume: Goal: Ability to maintain a balanced intake and output will improve Outcome: Progressing Pulling 50-100 on CRRT

## 2016-08-18 NOTE — Progress Notes (Signed)
CVVHD management. K low, will replace and change pre and post fluid to 4K.  Ca sl low, will replace.  Off pressors but remains on milrinone.  Since BP better and she is total body volume overloaded, will try to pull some fluid.

## 2016-08-19 LAB — RENAL FUNCTION PANEL
ALBUMIN: 2.1 g/dL — AB (ref 3.5–5.0)
Anion gap: 7 (ref 5–15)
BUN: 28 mg/dL — AB (ref 6–20)
CALCIUM: 7 mg/dL — AB (ref 8.9–10.3)
CO2: 23 mmol/L (ref 22–32)
Chloride: 105 mmol/L (ref 101–111)
Creatinine, Ser: 1.74 mg/dL — ABNORMAL HIGH (ref 0.44–1.00)
GFR calc Af Amer: 43 mL/min — ABNORMAL LOW (ref >60)
GFR calc non Af Amer: 37 mL/min — ABNORMAL LOW (ref >60)
GLUCOSE: 118 mg/dL — AB (ref 65–99)
PHOSPHORUS: 2.3 mg/dL — AB (ref 2.5–4.6)
Potassium: 3.4 mmol/L — ABNORMAL LOW (ref 3.5–5.1)
SODIUM: 135 mmol/L (ref 135–145)

## 2016-08-19 LAB — CBC
HCT: 25.1 % — ABNORMAL LOW (ref 36.0–46.0)
HEMOGLOBIN: 8.5 g/dL — AB (ref 12.0–15.0)
MCH: 27.8 pg (ref 26.0–34.0)
MCHC: 33.9 g/dL (ref 30.0–36.0)
MCV: 82 fL (ref 78.0–100.0)
Platelets: 69 10*3/uL — ABNORMAL LOW (ref 150–400)
RBC: 3.06 MIL/uL — ABNORMAL LOW (ref 3.87–5.11)
RDW: 18.4 % — ABNORMAL HIGH (ref 11.5–15.5)
WBC: 47.6 10*3/uL — ABNORMAL HIGH (ref 4.0–10.5)

## 2016-08-19 LAB — MAGNESIUM: Magnesium: 2.4 mg/dL (ref 1.7–2.4)

## 2016-08-19 LAB — GLUCOSE, CAPILLARY
GLUCOSE-CAPILLARY: 127 mg/dL — AB (ref 65–99)
Glucose-Capillary: 125 mg/dL — ABNORMAL HIGH (ref 65–99)
Glucose-Capillary: 126 mg/dL — ABNORMAL HIGH (ref 65–99)
Glucose-Capillary: 126 mg/dL — ABNORMAL HIGH (ref 65–99)

## 2016-08-19 LAB — COOXEMETRY PANEL
CARBOXYHEMOGLOBIN: 2.1 % — AB (ref 0.5–1.5)
Methemoglobin: 1 % (ref 0.0–1.5)
O2 SAT: 96.5 %
Total hemoglobin: 7.7 g/dL — ABNORMAL LOW (ref 12.0–16.0)

## 2016-08-19 MED ORDER — ATROPINE SULFATE 1 % OP SOLN
1.0000 [drp] | OPHTHALMIC | Status: DC | PRN
  Filled 2016-08-19: qty 2

## 2016-08-19 MED ORDER — SODIUM CHLORIDE 0.9 % IV SOLN
5.0000 mg/h | INTRAVENOUS | Status: DC
  Administered 2016-08-19: 5 mg/h via INTRAVENOUS
  Filled 2016-08-19: qty 10
  Filled 2016-08-19: qty 5

## 2016-08-20 LAB — CULTURE, BLOOD (ROUTINE X 2)
CULTURE: NO GROWTH
CULTURE: NO GROWTH
Special Requests: ADEQUATE
Special Requests: ADEQUATE

## 2016-08-28 ENCOUNTER — Telehealth: Payer: Self-pay

## 2016-08-28 NOTE — Telephone Encounter (Signed)
On 08/28/16 I received a death certificate from [facility] (original). The death certificate is for burial. The patient is a patient of Doctor Mannem.. The death certificate will be taken to Pulmonary Office @ [place] tomorrow to see if Doctor [place] can sign the d/c since Maneem is on vacation. [id number] [id number]  On Sep 06, 2016 I received the death certificate back from Doctor Maneem. I got the death certificate ready and called the funeral home to let them know I mailed the death certificate to [employer] per the funeral home request.

## 2016-08-30 LAB — ADAMTS13 ACTIVITY: Adamts 13 Activity: 25.5 % — ABNORMAL LOW (ref >66.8)

## 2016-08-30 LAB — ADAMTS13 ANTIBODY: ADAMTS13 ANTIBODY: 4 U/mL (ref <12)

## 2016-09-05 NOTE — Progress Notes (Signed)
Chaplain follow up with family to provide emotional and grief support to this family for this patient.  Patient has passed away.  Chaplain would like to thank medical staff for the support they have given to this patient and family.  Many family members expressing what a lovely person the patient was and the mother states that the storm is over now.  Chaplain appreciative of getting the opportunity to work with and witness the love amongst this family.    09-13-2016 1519  Clinical Encounter Type  Visited With Family  Visit Type Follow-up;Psychological support;Spiritual support;Social support;Death  Referral From Chaplain;Nurse  Consult/Referral To Chaplain  Spiritual Encounters  Spiritual Needs Emotional;Grief support

## 2016-09-05 NOTE — Procedures (Signed)
Extubation Procedure Note  Patient Details:   Name: Kiyoko Mcguirt DOB: [birth year] MRN: [id number]   Airway Documentation:  Airway 7 mm (Active)  Secured at (cm) 22 cm 08/21/16  8:48 AM  Measured From Lips 21-Aug-2016  8:48 AM  [place] 2016/08/21  8:48 AM  Secured By [employer] 21-Aug-2016  8:48 AM  Tube Holder Repositioned Yes 2016/08/21  8:48 AM  Cuff Pressure (cm H2O) 24 cm H2O 08/18/2016  8:00 AM  Site Condition Dry 08-21-2016  8:48 AM    Evaluation  O2 sats: transiently fell during during procedure Complications: No apparent complications Patient did not tolerate procedure well. Bilateral Breath Sounds: Diminished, Rhonchi   No.  This was a terminal extubation per Dr.'s orders   Merrik Puebla V 08/21/16, 1:17 PM

## 2016-09-05 NOTE — Progress Notes (Signed)
Long d/w family This included: father, mother, brother and her significant other.  They are ready to w/d.   Plan Stop vasoactive and inotropes Stop CRRT Extubate Cont morphine  Comfort care   60 minutes.   Erick Colace ACNP-BC [place] Pager # [phone] OR # [phone] if no answer

## 2016-09-05 NOTE — Discharge Summary (Signed)
Physician Discharge Summary  Patient ID: Casey Chapman MRN: [id number] DOB/AGE: Apr 20, 1985 31 y.o.  Admit date: 08/14/2016 Discharge date: September 14, 2016  Admission Diagnoses: Hypertension, cardiac arrest  Discharge Diagnoses:  Principal Problem:   Malignant hypertensive cardiomyopathy, with heart failure ([place]) Active Problems:   Cardiac arrest ([place])   Acute respiratory failure with hypoxia ([place])   Acute myopericarditis   Cardiogenic shock ([place])   Anoxic encephalopathy ([place])   AKI (acute kidney injury) ([place])   Palliative care encounter   Acute respiratory failure with hypoxemia (HCC)   Acute post-hemorrhagic anemia   Discharged Condition: Deceased  Hospital Course:  31 year old female with PMH of autoimmune hemolytic anemia s/p splenectomy, being followed by Dr. Burr Medico in Hematology. Has reported 60 lb weight loss in past 2 years with history of weakness and swelling in lower extremity.  Presented with pain in hands, feet, marked hypertension to the [facility] ER on 7/9.  Family noted development of a rash around chest/neck over the last year and progressive hypertension.  In the [facility] ER she was very hypertensive which was treated with labetalol, not long afterwards she had a cardiac arrest.  She was moved to [facility] later in the day with severe cardiogenic shock, unexplained airspace disease and recurrent episodes of cardiac arrest.  On 7/9 she had at least 4 episodes of cardiac arrest requiring CPR for approximately 30-45 minutes in total.  Echo on 7/9 showed a moderate pericardial effusion, LVEF 20-25%, RV function severely reduced.There is concern for an underlying connective tissue disease.  Unifying diagnosis at this point is not certain.   She continue to be supported with CVVH and multiple pressors. Neuro status remained poor and MRI brain confirmed severe anoxic injury  Discussed with extended family today and they have decided to make her comfort with terminal extubation.  Consults: Neurology,  Cardiology, Gastroenterology, Nephrology  Disposition: 20-Expired   Allergies as of 09/14/16   No Known Allergies     Medication List    ASK your doctor about these medications   acetaminophen 500 MG tablet Commonly known as:  TYLENOL Take 500-1,000 mg by mouth every 6 (six) hours as needed for mild pain, moderate pain, fever or headache. pain   furosemide 20 MG tablet Commonly known as:  LASIX Take 1 tablet (20 mg total) by mouth daily.   hydrocortisone cream 0.5 % Apply 1 application topically 2 (two) times daily as needed (for itching on arms/legs).   ibuprofen 200 MG tablet Commonly known as:  ADVIL,MOTRIN Take 400-800 mg by mouth every 6 (six) hours as needed for fever, headache, mild pain, moderate pain or cramping.        SignedMarshell Garfinkel 08/25/2016, 6:33 AM

## 2016-09-05 NOTE — Progress Notes (Signed)
Chaplain responded to page to this room for family who has decided to withdraw care of patient.   Mother and two close friends at bedside.  Chaplain allowed mom to express her feelings.  Mother said she feels like if she could cry she would feel better but currently she just feels numb.  Mother asked Chaplain to provide prayer at bedside with her.  Chaplain prayed allowing each person to express words.    Chaplain will be available for this family as needed and will be checking in with family for support throughout process.    August 26, 2016 1245  Clinical Encounter Type  Visited With Patient;Family  Visit Type Initial;Psychological support;Spiritual support;Social support;Critical Care  Referral From Nurse  Consult/Referral To Chaplain  Spiritual Encounters  Spiritual Needs Prayer;Ritual;Emotional;Grief support  Stress Factors  Family Stress Factors Loss

## 2016-09-05 NOTE — Progress Notes (Signed)
PULMONARY / CRITICAL CARE MEDICINE   Name: Casey Chapman MRN: [id number] DOB: [birth year]    ADMISSION DATE:  08/14/2016 CONSULTATION DATE:  08/29/2016  REFERRING MD:  Dr. Randal Buba   CHIEF COMPLAINT:  HTN   BRIEF SUMMARY:   31 year old female with PMH of autoimmune hemolytic anemia s/p splenectomy, being followed by Dr. Burr Medico in Hematology. Has reported 60 lb weight loss in past 2 years with history of weakness and swelling in lower extremity.  Presented with pain in hands, feet, marked hypertension to the [facility] ER on 7/9.  Family noted development of a rash around chest/neck over the last year and progressive hypertension.  In the [facility] ER she was very hypertensive which was treated with labetalol, not long afterwards she had a cardiac arrest.  She was moved to [facility] later in the day with severe cardiogenic shock, unexplained airspace disease and recurrent episodes of cardiac arrest.  On 7/9 she had at least 4 episodes of cardiac arrest requiring CPR for approximately 30-45 minutes in total.  Echo on 7/9 showed a moderate pericardial effusion, LVEF 20-25%, RV function severely reduced.   SUBJECTIVE:   No change Family planning of extubate later today   VITAL SIGNS: BP (!) 173/97 (BP Location: Right Arm)   Pulse (!) 119   Temp 99.7 F (37.6 C)   Resp (!) 27   Ht '5\' 6"'$  (1.676 m)   Wt 159 lb 6.3 oz (72.3 kg)   LMP 08/09/2016 (Exact Date) Comment: negative beta HCG 08/10/2016  SpO2 94%   BMI 25.73 kg/m   HEMODYNAMICS: CVP:  [2 mmHg-5 mmHg] 4 mmHg  VENTILATOR SETTINGS: Vent Mode: PRVC FiO2 (%):  [55 %-70 %] 55 % Set Rate:  [16 bmp] 16 bmp Vt Set:  [470 mL] 470 mL PEEP:  [5 cmH20] 5 cmH20 Plateau Pressure:  [17 cmH20-21 cmH20] 17 cmH20  INTAKE / OUTPUT: I/O last 3 completed shifts: In: 2478 [I.V.:1218; NG/GT:150; IV Piggyback:1110] Out: 3108 [Emesis/NG output:75; YTKPT:4656; Stool:700]  PHYSICAL EXAMINATION: Physical Exam  Constitutional: She has a sickly appearance. No distress. She is  intubated.  HENT:  Head: Normocephalic and atraumatic.  Eyes: Pupils are equal, round, and reactive to light. Conjunctivae are normal.  Cardiovascular: Normal rate, regular rhythm and normal pulses.   Diffuse anasarca  Pulmonary/Chest: She is intubated. She has rhonchi.  Abdominal: Soft. Normal appearance and bowel sounds are normal. There is no tenderness.  Musculoskeletal: Normal range of motion.  Neurological: She is unresponsive.  Skin: Skin is warm and dry. She is not diaphoretic.   LABS:  BMET  Recent Labs Lab 08/18/16 0628 08/18/16 1620 2016/09/12 0631  NA 132* 136 135  K 3.2* 3.5 3.4*  CL 100* 104 105  CO2 22 21* 23  BUN 35* 29* 28*  CREATININE 2.19* 1.89* 1.74*  GLUCOSE 113* 132* 118*    Electrolytes  Recent Labs Lab 08/17/16 0409  08/18/16 0400 08/18/16 0628 08/18/16 1620 09/12/16 0353 09/12/16 0631  CALCIUM  --   < > 6.3* 6.3* 6.8*  --  7.0*  MG 2.1  --  2.3  --   --  2.4  --   PHOS  --   < > 3.0 3.0 2.7  --  2.3*  < > = values in this interval not displayed.  CBC  Recent Labs Lab 08/17/16 0409  08/18/16 0400  08/18/16 1620 08/18/16 2134 2016-09-12 0353  WBC 26.6*  --  36.5*  --   --   --  47.6*  HGB 7.8*  < > 9.1*  < > 8.3* 8.4* 8.5*  HCT 22.6*  < > 26.0*  < > 24.5* 24.9* 25.1*  PLT 53*  --  56*  --   --   --  69*  < > = values in this interval not displayed.  Coag's  Recent Labs Lab 08/31/2016 1653 08/15/16 1040 08/17/16 0937  INR 2.90 2.37 1.20    Sepsis Markers  Recent Labs Lab 09/02/2016 0631  08/05/2016 2011 08/14/16 1134 08/15/16 0250 08/15/16 0847 08/15/16 1109  LATICACIDVEN 11.5*  < > 12.5*  --   --  3.5* 3.4*  PROCALCITON 3.08  --   --  >150.00 >150.00  --   --   < > = values in this interval not displayed.  ABG  Recent Labs Lab 08/16/16 0323 08/17/16 0418 08/18/16 0428  PHART 7.416 7.478* 7.437  PCO2ART 29.9* 28.3* 33.8  PO2ART 129* 156* 59.0*    Liver Enzymes  Recent Labs Lab 08/14/16 0358 08/15/16 1100   08/18/16 0400 08/18/16 0628 08/18/16 1620 08-28-16 0631  AST 1,901* 1,155*  --  1,160*  --   --   --   ALT 643* 668*  --  1,736*  --   --   --   ALKPHOS 63 62  --  188*  --   --   --   BILITOT 1.0 3.3*  --  3.3*  --   --   --   ALBUMIN 2.0* 2.5*  < > 2.2* 2.2* 2.2* 2.1*  < > = values in this interval not displayed.  Cardiac Enzymes  Recent Labs Lab 08/28/2016 1653 08/23/2016 1820 08/14/16 0358  TROPONINI >65.00* >65.00* >65.00*    Glucose  Recent Labs Lab 08/18/16 1132 08/18/16 1645 08/18/16 2138 08/18/16 2356 08/28/2016 0344 08-28-16 0751  GLUCAP 107* 109* 127* 125* 126* 126*    Imaging No results found.   STUDIES:  CTA Chest 05/02/16 >> Cardiomegaly with trace pericardial effusion for age. There is mild diffuse anasarca. Nonspecific retroclavicular, subpectoral, and bilateral axillary lymphadenopathy. Findings have been chronic unstable back to 2016 CT. No acute pulmonary embolus. Small hiatal hernia with fluid in the distal esophagus likely related to reflux.Scattered pulmonary blebs and bulla. No pneumonic consolidation, dominant mass, effusion or pneumothorax. CXR 7/9 >> Endotracheal tube tip approximately 2.3 cm superior to the carina. Moderate to marked cardiomegaly with globular cardiac configuration, consider pericardial effusion Development of hazy bilateral pulmonary opacity suspicious for vascular congestion and pulmonary edema CT Head 7/9 >> No acute process CTA Chest / ABD 7/9 >> stable cardiomegaly, large pericardial effusion (increased since 04/2016), no evidenceof aortic dissection or aneurysm, no PE.  Vascular congestion and pulmonary edema, scattered cystic changes of the lung, heterogeneous enhancement of the kidneys concerning for pyelonephritis, diffusely dilated small bowel concerning for ileus, diffuse subcutaneous edema / anasarca UDS 7/9 >> positive THC Echo 7/9 > LVEF 20-25%, mild LVH, mild LAE, moderate pericardial effusion CT head 7/10 >  nonacute  CULTURES: UA 7/9 >>  resp culture 7/9 >>neg  ANTIBIOTICS: Rocephin 7/9 >> 7/9 Cefepime 7/9 >> 7/15 Vanc 7/9 >>7/12  SIGNIFICANT EVENTS: 7/08  Presents to ED with HTN > arrested in ER 7/9 Cardiac arrest x4, moved to [facility] severe cardiomyopathy/acidosis 7/14 family made limited code   LINES/TUBES: ETT 7/9 >>  R IJ TLC 7/9 >>   AUTOIMMUNE 7/9  C3 >> normal C4 >> normal GBM >>> SSA - greater than 8 SSB > 5.1 DSDNA >>  1 Anti-scleroderma >> <  0.2 ANCA >> <3.5 ACE >> 33 RA >> normal CRP >> 2.6 ESR >> 20 Phosphatidylserine antibodies >>normal  DISCUSSION: 31 y/o female with underlying autoimmune hemolytic anemia presented on 7/9 with extremity pain and severe hypertension; when treated with labetalol developed cardiac arrest.  Noted to have a severe cardiomyopathy on exam, active urinary sediment.  Family notes severe pulmonary hypertension for weeks and an evolving rash over neck/chest.  There is concern for an underlying connective tissue disease.  Unifying diagnosis at this point is not certain.  ->MRI 0/25 c/w severe anoxic injury  ->plan for extubate today probably   ASSESSMENT / PLAN:  PULMONARY A: Acute respiratory failure with hypoxemia Acute pneumonitis vs pulm edema in setting of arrest ->PCXR personally reviewed new right ML and RLL atx/consolidation/collapse. Left basilar airspace disease (likely atx +/- effusion) P:   No change in rx Plan for extubate today Start morphine gtt  CARDIOVASCULAR A:  Cardiomyopathy> autoimmune? Viral? Favor autoimmune Cardiogenic shock > 7/11 cardiac index 4.7, CVP 9, Wedge 5 Pericardial effusion SVT P:  Cont steroids Cont pressors & inotropes for now -->no titration  Plan to stop w/ extubation   RENAL A:   AKI> anuric Metabolic acidosis> lactic, renal failure Renal enhancement on CT angiogram> possibly consistent with medical renal disease like glomerulonephritis Hypokalemia Hypocalcemia Concern for  glomerulonephritis > active urinary sediment on U/A P:   crrt    GASTROINTESTINAL A:   Weight loss in last year Ileus on CT GI bleeding event overnight P:   Dc tubefeeds ppi  HEMATOLOGIC A:   Hemolytic Anemia s/p Splenectomy  -CT A/P in 2015 showed adenopathy E inguinal, external iliac -CT CAP in 2016 showed axilla, supraclavicular and subpectoral adenopathy > progressive since 2011 -Left Axially Lymph node biopsy in 2016 > 2 Benign lymph nodes, no malignancy  P:  ppi No more cbc  Infectious disease. A:   Pyelonephritis? Doubt Aspiration pneumonia? Doubt Afebrile (but on CRRT) Wbc climbing but on steroids. all culture data negative   P:   Dc abx today   ENDOCRINE A:   Hyperglycemia  P:   ssi   NEUROLOGIC A:   Acute encephalopathy post arrest-> MRI 7/14 c/w severe anoxic injury  Seizures THC use Chances of meaningful neurological recovery is poor.  P:   Morphine gtt  FAMILY  - Updates: Family to arrive today at 24 AM for family meeting regarding plan of care Discussed MRI results and prognosis w/ mother. She does not want her daughter to suffer. She is reaching out to the patient's brothers as well as the patient's father. I have offered to speak w/ them if they desire. Anticipate we will be moving towards one-way extubation and transition to palliative care likely today per nursing update.   My cct 30 min   Erick Colace ACNP-BC [place] Pager # [phone] OR # [phone] if no answer  August 27, 2016 9:18 AM

## 2016-09-05 NOTE — Progress Notes (Signed)
Patient asystole & apneic on monitor; no heart tones or breath sounds auscultated with 2 RN's at [place], RN & Virgie Dad, RN.  Emotional support offered to and given to patient's family.    150 cc of Morphine wasted in sink witnessed by [place]. [place], Ardeth Sportsman

## 2016-09-05 NOTE — Progress Notes (Signed)
CVVHD management.  Remains on milrinone.  EEG shows only brainstem function. Her prognosis is poor.  Nurse says family is gathering and may move to comfort care.  K sl low, if plan is to cont with CVVHD then will replace if plan is to move to comfort care then will DC CVVHD.Casey Chapman

## 2016-09-05 DEATH — deceased
